# Patient Record
Sex: Female | Born: 1950 | Race: White | Hispanic: No | Smoking: Never smoker
Health system: Southern US, Community
[De-identification: ages and names within clinical notes are randomized; demographics above are authoritative.]

## PROBLEM LIST (undated history)

## (undated) DIAGNOSIS — F329 Major depressive disorder, single episode, unspecified: Secondary | ICD-10-CM

## (undated) DIAGNOSIS — F32A Depression, unspecified: Secondary | ICD-10-CM

## (undated) HISTORY — PX: BACK SURGERY: SHX140

## (undated) HISTORY — PX: KNEE ARTHROSCOPY: SUR90

## (undated) HISTORY — PX: COLON SURGERY: SHX602

---

## 1992-04-12 HISTORY — PX: ABDOMINAL HYSTERECTOMY: SHX81

## 1998-09-19 ENCOUNTER — Observation Stay (HOSPITAL_COMMUNITY): Admission: EM | Admit: 1998-09-19 | Discharge: 1998-09-20 | Payer: Self-pay | Admitting: Emergency Medicine

## 1998-09-19 ENCOUNTER — Encounter: Payer: Self-pay | Admitting: Emergency Medicine

## 1998-09-20 ENCOUNTER — Encounter: Payer: Self-pay | Admitting: General Surgery

## 1999-04-15 ENCOUNTER — Other Ambulatory Visit: Admission: RE | Admit: 1999-04-15 | Discharge: 1999-04-22 | Payer: Self-pay | Admitting: Psychiatry

## 1999-07-21 ENCOUNTER — Other Ambulatory Visit: Admission: RE | Admit: 1999-07-21 | Discharge: 1999-07-21 | Payer: Self-pay | Admitting: Obstetrics and Gynecology

## 2000-02-20 ENCOUNTER — Inpatient Hospital Stay (HOSPITAL_COMMUNITY): Admission: EM | Admit: 2000-02-20 | Discharge: 2000-02-22 | Payer: Self-pay | Admitting: Emergency Medicine

## 2000-02-20 ENCOUNTER — Encounter: Payer: Self-pay | Admitting: Surgery

## 2000-02-21 ENCOUNTER — Encounter: Payer: Self-pay | Admitting: Surgery

## 2002-02-12 ENCOUNTER — Other Ambulatory Visit: Admission: RE | Admit: 2002-02-12 | Discharge: 2002-02-12 | Payer: Self-pay | Admitting: Obstetrics and Gynecology

## 2002-06-20 ENCOUNTER — Other Ambulatory Visit: Admission: RE | Admit: 2002-06-20 | Discharge: 2002-06-20 | Payer: Self-pay | Admitting: Obstetrics & Gynecology

## 2002-08-10 ENCOUNTER — Inpatient Hospital Stay (HOSPITAL_COMMUNITY): Admission: RE | Admit: 2002-08-10 | Discharge: 2002-08-14 | Payer: Self-pay | Admitting: Neurosurgery

## 2002-08-10 ENCOUNTER — Encounter: Payer: Self-pay | Admitting: Neurosurgery

## 2003-04-23 ENCOUNTER — Other Ambulatory Visit: Admission: RE | Admit: 2003-04-23 | Discharge: 2003-04-23 | Payer: Self-pay | Admitting: Obstetrics and Gynecology

## 2003-10-17 ENCOUNTER — Inpatient Hospital Stay (HOSPITAL_COMMUNITY): Admission: AD | Admit: 2003-10-17 | Discharge: 2003-10-22 | Payer: Self-pay | Admitting: Psychiatry

## 2007-04-24 ENCOUNTER — Ambulatory Visit: Payer: Self-pay | Admitting: Family Medicine

## 2007-05-01 ENCOUNTER — Encounter: Admission: RE | Admit: 2007-05-01 | Discharge: 2007-06-15 | Payer: Self-pay | Admitting: Otolaryngology

## 2008-09-19 ENCOUNTER — Emergency Department (HOSPITAL_COMMUNITY): Admission: EM | Admit: 2008-09-19 | Discharge: 2008-09-19 | Payer: Self-pay | Admitting: Emergency Medicine

## 2009-02-22 ENCOUNTER — Emergency Department (HOSPITAL_COMMUNITY): Admission: EM | Admit: 2009-02-22 | Discharge: 2009-02-22 | Payer: Self-pay | Admitting: Emergency Medicine

## 2009-04-02 ENCOUNTER — Ambulatory Visit: Payer: Self-pay | Admitting: Family Medicine

## 2009-05-06 ENCOUNTER — Ambulatory Visit: Payer: Self-pay | Admitting: Family Medicine

## 2009-07-21 LAB — HM DEXA SCAN

## 2009-07-23 ENCOUNTER — Ambulatory Visit: Payer: Self-pay | Admitting: Family Medicine

## 2009-12-10 ENCOUNTER — Ambulatory Visit: Payer: Self-pay | Admitting: Family Medicine

## 2010-01-29 ENCOUNTER — Ambulatory Visit: Payer: Self-pay | Admitting: Family Medicine

## 2010-06-08 LAB — HM MAMMOGRAPHY: HM Mammogram: NEGATIVE

## 2010-06-10 ENCOUNTER — Other Ambulatory Visit: Payer: Self-pay | Admitting: Radiology

## 2010-07-15 LAB — CBC
HCT: 39.2 % (ref 36.0–46.0)
Hemoglobin: 13.6 g/dL (ref 12.0–15.0)
MCHC: 34.6 g/dL (ref 30.0–36.0)
MCV: 94.1 fL (ref 78.0–100.0)
Platelets: 195 10*3/uL (ref 150–400)
RBC: 4.17 MIL/uL (ref 3.87–5.11)
RDW: 12.6 % (ref 11.5–15.5)
WBC: 9.7 10*3/uL (ref 4.0–10.5)

## 2010-07-15 LAB — BASIC METABOLIC PANEL
BUN: 19 mg/dL (ref 6–23)
CO2: 28 mEq/L (ref 19–32)
Calcium: 8.9 mg/dL (ref 8.4–10.5)
Chloride: 100 mEq/L (ref 96–112)
Creatinine, Ser: 0.9 mg/dL (ref 0.4–1.2)
GFR calc Af Amer: >60 mL/min (ref >60)
GFR calc non Af Amer: >60 mL/min (ref >60)
Glucose, Bld: 118 mg/dL — ABNORMAL HIGH (ref 70–99)
Potassium: 4.2 mEq/L (ref 3.5–5.1)
Sodium: 136 mEq/L (ref 135–145)

## 2010-07-15 LAB — DIFFERENTIAL
Basophils Absolute: 0.2 10*3/uL — ABNORMAL HIGH (ref 0.0–0.1)
Basophils Relative: 2 % — ABNORMAL HIGH (ref 0–1)
Eosinophils Absolute: 0 10*3/uL (ref 0.0–0.7)
Eosinophils Relative: 0 % (ref 0–5)
Lymphocytes Relative: 7 % — ABNORMAL LOW (ref 12–46)
Lymphs Abs: 0.7 10*3/uL (ref 0.7–4.0)
Monocytes Absolute: 0.4 10*3/uL (ref 0.1–1.0)
Monocytes Relative: 4 % (ref 3–12)
Neutro Abs: 8.4 10*3/uL — ABNORMAL HIGH (ref 1.7–7.7)
Neutrophils Relative %: 86 % — ABNORMAL HIGH (ref 43–77)

## 2010-07-15 LAB — LITHIUM LEVEL: Lithium Lvl: 0.42 mEq/L — ABNORMAL LOW (ref 0.80–1.40)

## 2010-07-20 LAB — CBC
HCT: 38.3 % (ref 36.0–46.0)
Hemoglobin: 12.9 g/dL (ref 12.0–15.0)
MCHC: 33.7 g/dL (ref 30.0–36.0)
MCV: 92.2 fL (ref 78.0–100.0)
Platelets: 198 10*3/uL (ref 150–400)
RBC: 4.16 MIL/uL (ref 3.87–5.11)
RDW: 13.2 % (ref 11.5–15.5)
WBC: 5.9 10*3/uL (ref 4.0–10.5)

## 2010-07-20 LAB — POCT I-STAT, CHEM 8
BUN: 18 mg/dL (ref 6–23)
Calcium, Ion: 1.2 mmol/L (ref 1.12–1.32)
Chloride: 103 mEq/L (ref 96–112)
Creatinine, Ser: 0.9 mg/dL (ref 0.4–1.2)
Glucose, Bld: 166 mg/dL — ABNORMAL HIGH (ref 70–99)
HCT: 40 % (ref 36.0–46.0)
Hemoglobin: 13.6 g/dL (ref 12.0–15.0)
Potassium: 3.5 mEq/L (ref 3.5–5.1)
Sodium: 139 mEq/L (ref 135–145)
TCO2: 27 mmol/L (ref 0–100)

## 2010-07-20 LAB — DIFFERENTIAL
Basophils Absolute: 0 10*3/uL (ref 0.0–0.1)
Basophils Relative: 0 % (ref 0–1)
Eosinophils Absolute: 0 10*3/uL (ref 0.0–0.7)
Eosinophils Relative: 0 % (ref 0–5)
Lymphocytes Relative: 11 % — ABNORMAL LOW (ref 12–46)
Lymphs Abs: 0.7 10*3/uL (ref 0.7–4.0)
Monocytes Absolute: 0.2 10*3/uL (ref 0.1–1.0)
Monocytes Relative: 4 % (ref 3–12)
Neutro Abs: 4.9 10*3/uL (ref 1.7–7.7)
Neutrophils Relative %: 84 % — ABNORMAL HIGH (ref 43–77)

## 2010-07-20 LAB — URINALYSIS, ROUTINE W REFLEX MICROSCOPIC
Bilirubin Urine: NEGATIVE
Glucose, UA: NEGATIVE mg/dL
Hgb urine dipstick: NEGATIVE
Ketones, ur: NEGATIVE mg/dL
Nitrite: NEGATIVE
Protein, ur: NEGATIVE mg/dL
Specific Gravity, Urine: 1.019 (ref 1.005–1.030)
Urobilinogen, UA: 0.2 mg/dL (ref 0.0–1.0)
pH: 6.5 (ref 5.0–8.0)

## 2010-07-20 LAB — RAPID URINE DRUG SCREEN, HOSP PERFORMED
Amphetamines: NOT DETECTED
Barbiturates: NOT DETECTED
Benzodiazepines: POSITIVE — AB
Cocaine: NOT DETECTED
Opiates: NOT DETECTED
Tetrahydrocannabinol: NOT DETECTED

## 2010-08-28 NOTE — H&P (Signed)
[facility]  Patient:    Kaitlyn Lowe, Kaitlyn Lowe                         MRN: [id number] Adm. Date:  [id number] Attending:  Benny Lennert                         History and Physical  CHIEF COMPLAINT:  Abdominal pain.  CLINICAL HISTORY:  Kaitlyn Lowe is a 60 year old who developed after dinner last night some cramping and abdominal pain and distention.  At one point it was fairly sharp and penetrating and fairly severe.  She threw up about 5 a.m. and the pain has eased off, but she has noticed continued mild distention and some mild crampiness.  The patient relates that about six years ago she had a hysterectomy for endometriosis.  Somewhat after that she developed further symptoms and was found to have endometriosis of her colon.  Dr. Chelsea Aus apparently did a sigmoid resection and at the same time she had bilateral oophorectomies. Three weeks after that surgery she has developed what sounds like a small bowel obstruction and had to reoperated on.  Patient did well for approximately two years and then had to be readmitted with a partial small bowel obstruction which according to the patient resolved with IV fluids and n.p.o. for a couple of days.  Two years after that another episode of a partial small bowel obstruction again resolving with some IV fluids.  It is now two years after that episode that this episode has occurred.  The patient does note she had a small bowel movement this morning just prior to having nausea and vomiting and another small one about 7 a.m.  She did drink a little bit of milk after she threw up and that has stayed down.  She is not currently nauseated.  PAST MEDICAL HISTORY:  Operations:  In addition to the above mentioned surgery, she has had arthroscopic knee surgery.  ALLERGIES:  CODEINE causes nausea.  MEDICATIONS: 1. She is on clonazepam to help her sleep. 2. Antidepression medication.  HABITS:  Smoking:  None.   Alcohol occasional.  FAMILY HISTORY:  Unremarkable.  REVIEW OF SYSTEMS:  HEENT:  Negative.  CHEST:  No cough, shortness of breath. HEART:  No history of murmurs or hypertension.  ABDOMEN:  Negative except for HPI.  GASTROINTESTINAL:  Negative.  No problems since her last surgery, as she has no pelvic organs left.  EXTREMITIES:  Negative.  SOCIAL HISTORY:  The patient is a Associate Professor for elementary school.  PHYSICAL EXAMINATION:  GENERAL:  The patient is a healthy appearing 60 year old who is alert and oriented in no distress.  VITAL SIGNS:  Blood pressure 107/74, pulse 77, respirations 20, temperature 97.  HEENT:  Head is normocephalic.  Eyes:  Nonicteric.  Pupils are round and regular.  Pharynx:  There is no evidence of significant volume depletion. Mucous membranes are moist.  NECK:  Supple.  There is no mass or thyromegaly.  LUNGS:  Clear to auscultation.  HEART:  Regular.  No murmurs, rubs, or gallops.  ABDOMEN:  Slightly distended.  There is a well-healed midline scar from just above the umbilicus to the pubis and also a Pfannenstiel scar that is well healed.  There are no inguinal or incisional hernias noted.  The abdomen is soft but a little bit tender, but this is really a minimal finding.  There is no  guarding or rebound.  Bowel sounds are somewhat active.  RECTAL:  Not done.  EXTREMITIES:  Show no cyanosis or edema.  She has full adequate peripheral pulses, no varicosities.  NEUROLOGIC:  Grossly intact.  LABORATORY:  KUB and upright is reviewed and it looks like an early small bowel obstruction pattern with a little bit of air still in her colon, but some mildly dilated air fluid loops of small bowel.  There is no evidence of any free air.  Chest x-ray is unremarkable.  White count is normal.  Hemoglobin is 13 and electrolytes are normal.  IMPRESSION:  Recurrent partial small bowel obstruction.  PLAN:  Put her in on IV fluids and n.p.o.  and repeat x-rays in the morning.  I told her that at some point she may need to consider elective operative intervention, since this will be her third hospitalization for partial obstructions.  I also told her this time she may need more urgent surgery if this obstruction does not clear. DD:  02/20/00 TD:  02/20/00 Job: [id number]
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
## 2010-08-28 NOTE — H&P (Signed)
NAMEMARJANI, Kaitlyn Lowe NO.:  [id number]   MEDICAL RECORD NO.:  [id number]                   PATIENT TYPE:  IPS   LOCATION:  0506                                 [facility]ILITY[facility]   PHYSICIAN:  Jeanice Lim, M.D.              DATE OF BIRTH:  1950/04/24   DATE OF ADMISSION:  10/17/2003  DATE OF DISCHARGE:                         PSYCHIATRIC ADMISSION ASSESSMENT   IDENTIFYING INFORMATION:  The patient is a 60 year old single white female  voluntarily admitted on October 17, 2003.   HISTORY OF PRESENT ILLNESS:  The patient presents with a history of  depression, feeling very hopeless, feeling that she is dropping farther and  farther into a black tunnel.  Her stressors are that her parents are  elderly, they are selling their house, going to assisted living.  She is  unable to cope with that.  She feels very lonely.  Her 20-year relationship  with her same sex partner ended in 1999 and she has been unable to find  anyone since.  She is very overwhelmed with her occupation.  The patient  reports that she gets confused with her dates, unable to concentrate.  The  patient reports that she is unable to function as an adult any more.  The  patient reports a history of hypomania episodes when she was younger with  spending sprees, increased energy, and impulsivity.   PAST PSYCHIATRIC HISTORY:  This is the first admission to [facility].  She was an outpatient seeing Jasmine Pang, M.D.   SUBSTANCE ABUSE HISTORY:  She is a nonsmoker.  She denies any alcohol or  drug use.   PAST MEDICAL HISTORY:  Primary care [facility] Dattilio: Dr. Gordy Levan.  Medical  problems: None.   MEDICATIONS:  1. Restoril 30 mg at bedtime.  2. Adderall 20 mg daily.  3. Lexapro 20 mg daily.  4. Klonopin 0.5 mg t.i.d.  5. Estropip 0.75 mg/0.625 mg one half daily.   DRUG ALLERGIES:  CODEINE.   REVIEW OF SYSTEMS:  Review of systems is negative except for menopausal.   PHYSICAL EXAMINATION:  GENERAL:  Physical examination was performed.  This  is a middle-aged, well nourished, healthy appearing female in no acute  distress.  NEUROLOGIC:  Nonfocal neurological findings.  Easily able to perform normal  alternating movements, heel-to-shin.  VITAL SIGNS:  Temperature 97.4, heart rate 74, respirations 18, blood  pressure 119/81.  She weighs 128 pounds.   LABORATORY DATA:  CBC is within normal limits.  Chem 7 is within normal  limits.  TSH is 2.176.   SOCIAL HISTORY:  This is a 60 year old divorced white female with no  children.  She lives alone.  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  No legal problems.   FAMILY HISTORY:  Mother who has had ECT treatments in the past.   MENTAL STATUS EXAM:  Alert, middle-aged  female, cooperative, good eye  contact.  Speech is clear and articulate.  The patient feels depressed.  The  patient is somewhat irritable.  Thought processes are coherent; no evidence  of psychosis.  Cognitive functioning: Intact.  Memory is good.  Judgment is  fair.  Insight is fair.   ADMISSION DIAGNOSES:   AXIS I:  1. Major depressive disorder.  2. Rule out bipolar disorder.   AXIS II:  Deferred.   AXIS III:  None.   AXIS IV:  Problems with primary support group, occupation, other  psychosocial problems.   AXIS V:  Current is 30, this past year is 65-70.   INITIAL PLAN OF CARE:  Plan is a voluntary admission to [facility] for depression.  Contract for safety.  Stabilize mood and thinking.  We will decrease her Lexapro as it may be increasing the patient's  agitation.  Will add Depakote for mood stability and to decrease any  possibility of mood swings.  Will hold the patient's Adderall at this time.  The patient is to attend groups, increase coping skills.  The patient is to  follow up with Jasmine Pang, M.D.   ESTIMATED LENGTH OF STAY:  Three to five days.     Landry Corporal,  N.P.                       Jeanice Lim, M.D.    JO/MEDQ  D:  10/18/2003  T:  10/18/2003  Job:  [id number]

## 2010-08-28 NOTE — Discharge Summary (Signed)
[facility]  Patient:    Kaitlyn Lowe, Kaitlyn Lowe                         MRN: [id number] Adm. Date:  [id number] Disc. Date: 02/22/00 Attending:  Charlton Haws                           Discharge Summary  ACCOUNT:  [id number]  FINAL DIAGNOSIS:  Partial small-bowel obstruction, resolved.  CLINICAL HISTORY:  This patient is a 59 year old who is status post TAH as well as probable sigmoid colectomy for endometriosis who has had one small-bowel obstruction requiring operation and two others each two years apart that resolved spontaneously.  She comes back in with typical symptoms of a partial small-bowel obstruction similar to what she has had previously.  On exam on admission she was in no distress, but was distended with some hyperactive bowel sounds and no significant tenderness.  ADMISSION LABORATORY AND X-RAY DATA:  Lab studies were unremarkable.  Abdominal films were consistent with a small-bowel obstruction pattern.  HOSPITAL COURSE:  The patient was admitted and put on some IV fluids and left n.p.o.  The next morning she had passed gas, had a small bowel movement, and her abdomen was less distended.  X-rays showed a basically normal gas pattern with air in her colon and a little stool in her rectum.  She was given a Fleet enema and started on diet, and she tolerated that nicely and the next morning continued to feel fine, seemed to be passing gas.  Her abdomen was benign, soft, flat, nontender, and nondistended.  The patient was felt to have resolved her small-bowel obstruction and able to be discharged.  She felt ready to go.  CONDITION ON DISCHARGE:  She was discharged in satisfactory condition.  DISCHARGE MEDICATIONS:  No home medications.  DISCHARGE INSTRUCTIONS:  Usual activities.  We did ask her to follow a low-residue diet for a few days.  FOLLOWUP:  Follow up on a p.r.n. basis should she have any further symptoms. DD:  02/22/00 TD:   02/22/00 Job: [id number]
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
## 2010-08-28 NOTE — Discharge Summary (Signed)
Kaitlyn Lowe, Kaitlyn Lowe NO.:  [id number]   MEDICAL RECORD NO.:  [id number]                   PATIENT TYPE:  IPS   LOCATION:  0506                                 [facility]ACILITY[facility]  [facility]   PHYSICIAN:  Geoffery Lyons, M.D.                   DATE OF BIRTH:  Mar 02, 1951   DATE OF ADMISSION:  10/17/2003  DATE OF DISCHARGE:  10/22/2003                                 DISCHARGE SUMMARY   CHIEF COMPLAINT AND PRESENTING ILLNESS:  This was the first admission to  [facility]  for this 60 year old single white female,  voluntarily admitted.  History of depression, feeling very hopeless, feeling  that she was dropping further and further into a black tunnel.  Her  stressors are that her parents are elderly.  They are selling their house,  going to assisted living, unable to cope with that.  Felt very lonely.  Her  20 year relationship with her same-sex partner ended in 1999, unable to find  anyone since.  Feeling overwhelmed with her occupation.  She has confused  her dates, unable to concentrate, unable to function as an adult anymore.  History of hypomania episodes when she was younger, with increased energy  and impulsivity.   PAST PSYCHIATRIC HISTORY:  First time [facility], outpatient  seeing Milford Cage.   ALCOHOL AND DRUG HISTORY:  Denies the use or abuse of any substances.   PAST MEDICAL HISTORY:  Noncontributory.   MEDICATIONS:  1. Restoril 30 mg at night.  2. Adderall 20 mg daily.  3. Lexapro 20 mg daily.  4. Klonopin 0.5 3 times a day.  5. Estropipate .75 mg-.625 mg 1 tab daily.   PHYSICAL EXAMINATION:  Performed, failed to show any acute findings.   LABORATORY WORKUP:  CBC within normal limits.  Chem 7 within normal limits.  TSH 2.176.  blood chemistries were within normal limits.   MENTAL STATUS EXAM:  Reveals an alert female, cooperative, good eye contact.  Speech was clear and articulate, felt depressed, somewhat  irritable.  Thought processes were coherent, no evidence of psychosis.  Cognition well  preserved.   ADMISSION DIAGNOSES:   AXIS I:  Rule out bipolar disorder, depressed versus unipolar depression.   AXIS II:  No diagnosis.   AXIS III:  No diagnosis.   AXIS IV:  Moderate.   AXIS V:  Global assessment of function upon admission 30, highest global  assessment of function in past year 65-70.   COURSE IN HOSPITAL:  She was admitted and started on intensive individual  and group psychotherapy.  She was maintained on the Restoril 30 at night,  Klonopin 0.5 3 times a day, and Lexapro 20 mg per day, and the estropipate  .75/.625 once daily.  She was started on started on Xanax 2 in the morning.  Lexapro was changed to 10 mg twice a day and  she was started on Depakote ER  250 twice a day.  She was given some Seroquel 25 as needed..  She endorsed  depression, still racing thoughts, also stress in her life.  Sense of  hopelessness and helplessness, vague suicidal thoughts.  Depakote was  changed to 250 in the morning and 500 at night.  By July 10 she was  experiencing some improvement on the racing thoughts and mood swings.  She  was tolerating medication well.  Continued to taper the Lexapro down to 5 mg  per day.  On July 11, some episode of irritability, anger, loss of control,  impulsivity, already off the Lexapro.  We went ahead and increased the  Depakote further.  On July 12, she was in full contact with reality.  There  were no suicidal ideas, no homicidal ideas, no hallucinations, no delusions.  Endorsing that she was feeling much better, willing to pursue further  outpatient treatment and continue the medications as prescribed.  If in need  of an antidepressant, was going to be considered for Zoloft on an outpatient  basis.   DISCHARGE DIAGNOSES:   AXIS I:  Bipolar disorder, depressed.   AXIS II:  No diagnosis.   AXIS III:  No diagnosis.   AXIS IV:  Moderate.   AXIS V:   Global assessment of function upon discharge 55-60.   DISCHARGE MEDICATIONS:  1. Restoril 30 mg at night.  2. Klonopin 0.5 3 times a day as needed.  3. Senokot 2 tabs every day.  4. Ogen .0313 mg daily.  5. Depakote ER 250 3 times a day.   DISPOSITION:  Will follow up with Dr. Milford Cage and will call mental  health IOP if needed.                                               Geoffery Lyons, M.D.    IL/MEDQ  D:  11/12/2003  T:  11/13/2003  Job:  [id number]
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
## 2010-10-22 ENCOUNTER — Other Ambulatory Visit: Payer: Self-pay | Admitting: Neurosurgery

## 2010-10-22 DIAGNOSIS — M549 Dorsalgia, unspecified: Secondary | ICD-10-CM

## 2010-10-22 DIAGNOSIS — M5410 Radiculopathy, site unspecified: Secondary | ICD-10-CM

## 2010-11-02 MED ORDER — DIAZEPAM 2 MG PO TABS
10.0000 mg | ORAL_TABLET | Freq: Once | ORAL | Status: AC
  Administered 2010-11-03: 10 mg via ORAL

## 2010-11-03 ENCOUNTER — Ambulatory Visit
Admission: RE | Admit: 2010-11-03 | Discharge: 2010-11-03 | Disposition: A | Discharge status: Home / Self Care | Payer: BC Managed Care – PPO | Source: Ambulatory Visit | Attending: Neurosurgery | Admitting: Neurosurgery

## 2010-11-03 DIAGNOSIS — M549 Dorsalgia, unspecified: Secondary | ICD-10-CM

## 2010-11-03 DIAGNOSIS — M5410 Radiculopathy, site unspecified: Secondary | ICD-10-CM

## 2010-11-03 MED ORDER — IOHEXOL 180 MG/ML  SOLN
15.0000 mL | Freq: Once | INTRAMUSCULAR | Status: AC | PRN
  Administered 2010-11-03: 15 mL via INTRATHECAL

## 2010-11-03 MED ORDER — IOHEXOL 180 MG/ML  SOLN: 15.0000 mL | Freq: Once | INTRAMUSCULAR | Status: DC | PRN

## 2010-11-03 MED ORDER — DEXTROSE-NACL 5-0.45 % IV SOLN: INTRAVENOUS | Status: DC

## 2011-01-06 ENCOUNTER — Encounter: Payer: Self-pay | Admitting: Family Medicine

## 2011-01-07 ENCOUNTER — Encounter: Payer: Self-pay | Admitting: Family Medicine

## 2011-10-19 ENCOUNTER — Encounter: Payer: Self-pay | Admitting: Internal Medicine

## 2011-10-19 LAB — HM MAMMOGRAPHY

## 2012-10-10 LAB — HM MAMMOGRAPHY: HM Mammogram: NEGATIVE

## 2012-10-10 LAB — HM DEXA SCAN

## 2012-10-17 ENCOUNTER — Encounter (HOSPITAL_COMMUNITY): Payer: Self-pay

## 2012-10-17 ENCOUNTER — Inpatient Hospital Stay (HOSPITAL_COMMUNITY)
Admission: EM | Admit: 2012-10-17 | Discharge: 2012-10-19 | DRG: 181 | Disposition: A | Discharge status: Home / Self Care | Payer: BC Managed Care – PPO | Attending: General Surgery | Admitting: General Surgery

## 2012-10-17 ENCOUNTER — Encounter: Payer: Self-pay | Admitting: Internal Medicine

## 2012-10-17 ENCOUNTER — Emergency Department (HOSPITAL_COMMUNITY): Payer: BC Managed Care – PPO

## 2012-10-17 DIAGNOSIS — K56609 Unspecified intestinal obstruction, unspecified as to partial versus complete obstruction: Secondary | ICD-10-CM

## 2012-10-17 DIAGNOSIS — F3289 Other specified depressive episodes: Secondary | ICD-10-CM | POA: Diagnosis present

## 2012-10-17 DIAGNOSIS — Z90710 Acquired absence of both cervix and uterus: Secondary | ICD-10-CM

## 2012-10-17 DIAGNOSIS — F329 Major depressive disorder, single episode, unspecified: Secondary | ICD-10-CM | POA: Diagnosis present

## 2012-10-17 DIAGNOSIS — Z9049 Acquired absence of other specified parts of digestive tract: Secondary | ICD-10-CM

## 2012-10-17 HISTORY — DX: Major depressive disorder, single episode, unspecified: F32.9

## 2012-10-17 HISTORY — DX: Depression, unspecified: F32.A

## 2012-10-17 LAB — CBC WITH DIFFERENTIAL/PLATELET
Basophils Absolute: 0 10*3/uL (ref 0.0–0.1)
Basophils Relative: 0 % (ref 0–1)
Eosinophils Absolute: 0.1 10*3/uL (ref 0.0–0.7)
Eosinophils Relative: 1 % (ref 0–5)
HCT: 42.2 % (ref 36.0–46.0)
Hemoglobin: 14.2 g/dL (ref 12.0–15.0)
Lymphocytes Relative: 10 % — ABNORMAL LOW (ref 12–46)
Lymphs Abs: 1 10*3/uL (ref 0.7–4.0)
MCH: 30.7 pg (ref 26.0–34.0)
MCHC: 33.6 g/dL (ref 30.0–36.0)
MCV: 91.3 fL (ref 78.0–100.0)
Monocytes Absolute: 0.5 10*3/uL (ref 0.1–1.0)
Monocytes Relative: 5 % (ref 3–12)
Neutro Abs: 8.2 10*3/uL — ABNORMAL HIGH (ref 1.7–7.7)
Neutrophils Relative %: 84 % — ABNORMAL HIGH (ref 43–77)
Platelets: 283 10*3/uL (ref 150–400)
RBC: 4.62 MIL/uL (ref 3.87–5.11)
RDW: 13.1 % (ref 11.5–15.5)
WBC: 9.7 10*3/uL (ref 4.0–10.5)

## 2012-10-17 LAB — COMPREHENSIVE METABOLIC PANEL
ALT: 17 U/L (ref 0–35)
AST: 21 U/L (ref 0–37)
Albumin: 4.1 g/dL (ref 3.5–5.2)
Alkaline Phosphatase: 36 U/L — ABNORMAL LOW (ref 39–117)
BUN: 19 mg/dL (ref 6–23)
CO2: 31 mEq/L (ref 19–32)
Calcium: 11.2 mg/dL — ABNORMAL HIGH (ref 8.4–10.5)
Chloride: 100 mEq/L (ref 96–112)
Creatinine, Ser: 0.91 mg/dL (ref 0.50–1.10)
GFR calc Af Amer: 77 mL/min — ABNORMAL LOW (ref >90)
GFR calc non Af Amer: 66 mL/min — ABNORMAL LOW (ref >90)
Glucose, Bld: 108 mg/dL — ABNORMAL HIGH (ref 70–99)
Potassium: 4 mEq/L (ref 3.5–5.1)
Sodium: 139 mEq/L (ref 135–145)
Total Bilirubin: 0.7 mg/dL (ref 0.3–1.2)
Total Protein: 6.9 g/dL (ref 6.0–8.3)

## 2012-10-17 LAB — URINALYSIS, ROUTINE W REFLEX MICROSCOPIC
Bilirubin Urine: NEGATIVE
Glucose, UA: NEGATIVE mg/dL
Hgb urine dipstick: NEGATIVE
Ketones, ur: NEGATIVE mg/dL
Nitrite: NEGATIVE
Protein, ur: NEGATIVE mg/dL
Specific Gravity, Urine: 1.019 (ref 1.005–1.030)
Urobilinogen, UA: 1 mg/dL (ref 0.0–1.0)
pH: 8 (ref 5.0–8.0)

## 2012-10-17 LAB — LIPASE, BLOOD: Lipase: 27 U/L (ref 11–59)

## 2012-10-17 LAB — URINE MICROSCOPIC-ADD ON

## 2012-10-17 MED ORDER — POTASSIUM CHLORIDE IN NACL 20-0.45 MEQ/L-% IV SOLN
INTRAVENOUS | Status: DC
  Administered 2012-10-17 – 2012-10-19 (×5): via INTRAVENOUS
  Filled 2012-10-17 (×7): qty 1000

## 2012-10-17 MED ORDER — LITHIUM CARBONATE 300 MG PO CAPS
300.0000 mg | ORAL_CAPSULE | Freq: Two times a day (BID) | ORAL | Status: AC
  Administered 2012-10-18 (×2): 300 mg via ORAL
  Filled 2012-10-17 (×3): qty 1

## 2012-10-17 MED ORDER — HYDROMORPHONE HCL PF 1 MG/ML IJ SOLN
1.0000 mg | Freq: Once | INTRAMUSCULAR | Status: AC
  Administered 2012-10-17: 1 mg via INTRAVENOUS
  Filled 2012-10-17: qty 1

## 2012-10-17 MED ORDER — ONDANSETRON HCL 4 MG/2ML IJ SOLN
4.0000 mg | Freq: Once | INTRAMUSCULAR | Status: AC
  Administered 2012-10-17: 4 mg via INTRAVENOUS
  Filled 2012-10-17: qty 2

## 2012-10-17 MED ORDER — FLUOXETINE HCL 20 MG PO CAPS
20.0000 mg | ORAL_CAPSULE | Freq: Every day | ORAL | Status: DC
  Administered 2012-10-18 – 2012-10-19 (×2): 20 mg via ORAL
  Filled 2012-10-17 (×2): qty 1

## 2012-10-17 MED ORDER — IOHEXOL 300 MG/ML  SOLN
100.0000 mL | Freq: Once | INTRAMUSCULAR | Status: AC | PRN
  Administered 2012-10-17: 100 mL via INTRAVENOUS

## 2012-10-17 MED ORDER — MORPHINE SULFATE 2 MG/ML IJ SOLN
1.0000 mg | INTRAMUSCULAR | Status: DC | PRN
  Administered 2012-10-17: 2 mg via INTRAVENOUS
  Filled 2012-10-17: qty 1

## 2012-10-17 MED ORDER — ONDANSETRON HCL 4 MG/2ML IJ SOLN
4.0000 mg | Freq: Four times a day (QID) | INTRAMUSCULAR | Status: DC | PRN
  Administered 2012-10-17 – 2012-10-18 (×2): 4 mg via INTRAVENOUS
  Filled 2012-10-17 (×2): qty 2

## 2012-10-17 MED ORDER — IOHEXOL 300 MG/ML  SOLN
50.0000 mL | Freq: Once | INTRAMUSCULAR | Status: AC | PRN
  Administered 2012-10-17: 50 mL via INTRAVENOUS

## 2012-10-17 MED ORDER — TRAZODONE HCL 100 MG PO TABS
100.0000 mg | ORAL_TABLET | Freq: Every day | ORAL | Status: DC
Start: 2012-10-17 — End: 2012-10-19
  Administered 2012-10-17 – 2012-10-18 (×2): 100 mg via ORAL
  Filled 2012-10-17 (×3): qty 1

## 2012-10-17 MED ORDER — HEPARIN SODIUM (PORCINE) 5000 UNIT/ML IJ SOLN
5000.0000 [IU] | Freq: Three times a day (TID) | INTRAMUSCULAR | Status: DC
  Administered 2012-10-17 – 2012-10-19 (×5): 5000 [IU] via SUBCUTANEOUS
  Filled 2012-10-17 (×8): qty 1

## 2012-10-17 MED ORDER — SODIUM CHLORIDE 0.9 % IV SOLN
1000.0000 mL | Freq: Once | INTRAVENOUS | Status: AC
  Administered 2012-10-17: 1000 mL via INTRAVENOUS

## 2012-10-17 NOTE — ED Provider Notes (Signed)
History    CSN: [id number] Arrival date & time 10/17/12  1439  First MD Initiated Contact with Patient 10/17/12 1514     Chief Complaint  Patient presents with  . Abdominal Pain   HPI  Patient presents with abdominal pain, nausea and anorexia. Symptoms began yesterday, subtly, progressed over the course of the day today.  The pain is about the umbilicus, and upper abdomen.  The pain is nonradiating, sore, crampy with associated abdominal distention. Patient's last bowel movement was approximately 6 hours ago. She denies fever, chills, confusion constipation. No clear alleviating or exacerbating factors. Patient states that these symptoms are the same as those she experienced during prior small bowel obstructions.  Past Medical History  Diagnosis Date  . Depression    Past Surgical History  Procedure Laterality Date  . Abdominal hysterectomy  1994  . Back surgery    . Colon surgery    . Knee arthroscopy      bilateral knees.   Family History  Problem Relation Age of Onset  . Arthritis Mother   . Mental illness Mother   . Hypertension Mother    History  Substance Use Topics  . Smoking status: Never Smoker   . Smokeless tobacco: Never Used  . Alcohol Use: Yes     Comment: three times a week 2 drinks at a time.   OB History   Grav Para Term Preterm Abortions TAB SAB Ect Mult Living                 Review of Systems  Constitutional:       Per HPI, otherwise negative  HENT:       Per HPI, otherwise negative  Respiratory:       Per HPI, otherwise negative  Cardiovascular:       Per HPI, otherwise negative  Gastrointestinal: Positive for nausea and abdominal pain. Negative for vomiting.  Endocrine:       Negative aside from HPI  Genitourinary:       Neg aside from HPI   Musculoskeletal:       Per HPI, otherwise negative  Skin: Negative.   Neurological: Negative for syncope.    Allergies  Codeine  Home Medications   Current Outpatient Rx  Name  Route   Sig  Dispense  Refill  . estradiol (ESTRACE) 0.5 MG tablet   Oral   Take 0.5 mg by mouth daily.          Marland Kitchen FLUoxetine (PROZAC) 20 MG capsule   Oral   Take 20 mg by mouth daily.         Marland Kitchen lithium carbonate 300 MG capsule   Oral   Take 300 mg by mouth 2 (two) times daily with a meal.           . naproxen sodium (ANAPROX) 220 MG tablet   Oral   Take 220 mg by mouth 2 (two) times daily with a meal.         . traZODone (DESYREL) 100 MG tablet   Oral   Take 100 mg by mouth at bedtime.            BP 160/86  Pulse 69  Temp(Src) 98.1 F (36.7 C) (Oral)  Resp 14  SpO2 97% Physical Exam  Nursing note and vitals reviewed. Constitutional: She is oriented to person, place, and time. She appears well-developed and well-nourished. No distress.  HENT:  Head: Normocephalic and atraumatic.  Eyes: Conjunctivae and EOM are  normal.  Cardiovascular: Normal rate and regular rhythm.   Pulmonary/Chest: Effort normal and breath sounds normal. No stridor. No respiratory distress.  Abdominal: Soft. She exhibits distension. There is tenderness in the epigastric area and periumbilical area. There is guarding. There is no rigidity and no rebound.  Musculoskeletal: She exhibits no edema.  Neurological: She is alert and oriented to person, place, and time. No cranial nerve deficit.  Skin: Skin is warm and dry.  Psychiatric: She has a normal mood and affect.    ED Course  Procedures (including critical care time) Labs Reviewed  CBC WITH DIFFERENTIAL - Abnormal; Notable for the following:    Neutrophils Relative % 84 (*)    Neutro Abs 8.2 (*)    Lymphocytes Relative 10 (*)    All other components within normal limits  COMPREHENSIVE METABOLIC PANEL - Abnormal; Notable for the following:    Glucose, Bld 108 (*)    Calcium 11.2 (*)    Alkaline Phosphatase 36 (*)    GFR calc non Af Amer 66 (*)    GFR calc Af Amer 77 (*)    All other components within normal limits  URINALYSIS, ROUTINE W  REFLEX MICROSCOPIC - Abnormal; Notable for the following:    APPearance TURBID (*)    Leukocytes, UA SMALL (*)    All other components within normal limits  LIPASE, BLOOD  URINE MICROSCOPIC-ADD ON   No results found. No diagnosis found. Pulse ox 99% room air normal   Update: I discussed CT results with the patient.  She states that her pain is significantly better dilaudid.  MDM  Patient presents abdominal pain.  She has a tender abdomen on exam, but is not peritoneal.  With her description of pain similar to SBO in the past, this was an initial consideration.  This was indeed a demonstrated on CT scan.  She was admitted for further evaluation and management. Advocated for the placement of an NG tube.  The patient deferred this suggestion.  Gerhard Munch, MD 10/17/12 2150

## 2012-10-17 NOTE — Progress Notes (Signed)
Pt confirms pcp is [facility] EP[facility] updated

## 2012-10-17 NOTE — Progress Notes (Signed)
Utilization Review completed.  Levia Waltermire RN CM  

## 2012-10-17 NOTE — ED Notes (Signed)
Patient reports that she has had mid epigastric pain since last pm. Abdomen is distended. Patient denies N/V/D. Patient states her pain has gotten progressively worse. Patient's last BM was this AM and was normal.

## 2012-10-17 NOTE — Progress Notes (Signed)
Pt successfully signed up on My Chart. Briscoe Burns BSN, RN-BC Admissions RN 10/17/2012 6:49 PM

## 2012-10-17 NOTE — H&P (Signed)
Reason for Consult:bowel obstruction Referring Physician: Stefhanie Lowe is an 62 y.o. female.  HPI: I was asked to evaluate this patient for a small bowel obstruction. She has a history of several abdominal surgeries for endometriosis as well as a right colectomy in the late 1990s. She says that since then she has had about 3 or 4 episodes of bowel obstructions which occurred about every 5 years and each time have been treated nonoperatively.  She says that her current symptoms began this morning when she awoke with some abdominal pain mainly located in her epigastric region but she has this diffusely as well where she says it feels like someone is punching her.  She says that she has moved her bowels this morning which was normal and has been passing gas she has not been vomiting except for in the emergency room. She denies any fevers or chills. She denies any blood in the stools or melena and she says that she is current on her colonoscopy  Past Medical History  Diagnosis Date  . Depression     Past Surgical History  Procedure Laterality Date  . Abdominal hysterectomy  1994  . Back surgery    . Colon surgery    . Knee arthroscopy      bilateral knees.    Family History  Problem Relation Age of Onset  . Arthritis Mother   . Mental illness Mother   . Hypertension Mother     Social History:  reports that she has never smoked. She has never used smokeless tobacco. She reports that  drinks alcohol. She reports that she does not use illicit drugs.  Allergies:  Allergies  Allergen Reactions  . Codeine Nausea Only    Medications: I have reviewed the patient's current medications.  Results for orders placed during the hospital encounter of 10/17/12 (from the past 48 hour(s))  URINALYSIS, ROUTINE W REFLEX MICROSCOPIC     Status: Abnormal   Collection Time    10/17/12  3:27 PM      Result Value Range   Color, Urine YELLOW  YELLOW   APPearance TURBID (*) CLEAR   Specific  Gravity, Urine 1.019  1.005 - 1.030   pH 8.0  5.0 - 8.0   Glucose, UA NEGATIVE  NEGATIVE mg/dL   Hgb urine dipstick NEGATIVE  NEGATIVE   Bilirubin Urine NEGATIVE  NEGATIVE   Ketones, ur NEGATIVE  NEGATIVE mg/dL   Protein, ur NEGATIVE  NEGATIVE mg/dL   Urobilinogen, UA 1.0  0.0 - 1.0 mg/dL   Nitrite NEGATIVE  NEGATIVE   Leukocytes, UA SMALL (*) NEGATIVE  URINE MICROSCOPIC-ADD ON     Status: None   Collection Time    10/17/12  3:27 PM      Result Value Range   WBC, UA 0-2  <3 WBC/hpf   Comment: 0-2   Urine-Other AMORPHOUS URATES/PHOSPHATES     Comment: AMORPHOUS URATES/PHOSPHATES  CBC WITH DIFFERENTIAL     Status: Abnormal   Collection Time    10/17/12  3:37 PM      Result Value Range   WBC 9.7  4.0 - 10.5 K/uL   RBC 4.62  3.87 - 5.11 MIL/uL   Hemoglobin 14.2  12.0 - 15.0 g/dL   HCT 16.1  09.6 - 04.5 %   MCV 91.3  78.0 - 100.0 fL   MCH 30.7  26.0 - 34.0 pg   MCHC 33.6  30.0 - 36.0 g/dL   RDW 40.9  81.1 - 91.4 %  Platelets 283  150 - 400 K/uL   Neutrophils Relative % 84 (*) 43 - 77 %   Neutro Abs 8.2 (*) 1.7 - 7.7 K/uL   Lymphocytes Relative 10 (*) 12 - 46 %   Lymphs Abs 1.0  0.7 - 4.0 K/uL   Monocytes Relative 5  3 - 12 %   Monocytes Absolute 0.5  0.1 - 1.0 K/uL   Eosinophils Relative 1  0 - 5 %   Eosinophils Absolute 0.1  0.0 - 0.7 K/uL   Basophils Relative 0  0 - 1 %   Basophils Absolute 0.0  0.0 - 0.1 K/uL  COMPREHENSIVE METABOLIC PANEL     Status: Abnormal   Collection Time    10/17/12  3:37 PM      Result Value Range   Sodium 139  135 - 145 mEq/L   Potassium 4.0  3.5 - 5.1 mEq/L   Chloride 100  96 - 112 mEq/L   CO2 31  19 - 32 mEq/L   Glucose, Bld 108 (*) 70 - 99 mg/dL   BUN 19  6 - 23 mg/dL   Creatinine, Ser 1.61  0.50 - 1.10 mg/dL   Calcium 09.6 (*) 8.4 - 10.5 mg/dL   Total Protein 6.9  6.0 - 8.3 g/dL   Albumin 4.1  3.5 - 5.2 g/dL   AST 21  0 - 37 U/L   ALT 17  0 - 35 U/L   Alkaline Phosphatase 36 (*) 39 - 117 U/L   Total Bilirubin 0.7  0.3 - 1.2 mg/dL    GFR calc non Af Amer 66 (*) >90 mL/min   GFR calc Af Amer 77 (*) >90 mL/min   Comment:            The eGFR has been calculated     using the CKD EPI equation.     This calculation has not been     validated in all clinical     situations.     eGFR's persistently     <90 mL/min signify     possible Chronic Kidney Disease.  LIPASE, BLOOD     Status: None   Collection Time    10/17/12  3:37 PM      Result Value Range   Lipase 27  11 - 59 U/L    Ct Abdomen Pelvis W Contrast  10/17/2012   *RADIOLOGY REPORT*  Clinical Data: Abdominal pain.  History small bowel obstruction. Mid epigastric pain.  CT ABDOMEN AND PELVIS WITH CONTRAST  Technique:  Multidetector CT imaging of the abdomen and pelvis was performed following the standard protocol during bolus administration of intravenous contrast.  Contrast: 50mL OMNIPAQUE IOHEXOL 300 MG/ML  SOLN, OMNIPAQUE IOHEXOL 300 MG/ML  SOLN  Comparison: None.  Findings: Lung bases are clear.  No effusions.  Heart is normal size.  Small hypodensities scattered throughout the liver compatible with cysts.  The gallbladder, spleen, pancreas, adrenals and kidneys are normal.  There are dilated proximal and mid small bowel loops.  Mid to distal small bowel is decompressed.  Findings compatible with small bowel obstruction.  Transition point appears to be in the mid pelvis without identifiable cause, presumably adhesions. Postsurgical changes in the sigmoid colon.  There is trace free fluid around the liver and in the pelvis.  Mild edema within small bowel mesentery.  Aorta is normal caliber.  Mesenteric vessels are patent.  No free air or adenopathy.  No acute bony abnormality.  Postoperative changes from posterior  fusion at L5 S1.  IMPRESSION: Mid small bowel obstruction.  Transition point is in the region of the mid pelvis without identifiable cause, presumably adhesions.  Small amount of free fluid in the abdomen and pelvis.  Edema within small bowel mesentery.    Original Report Authenticated By: Charlett Nose, M.D.    All other review of systems negative or noncontributory except as stated in the HPI   Blood pressure 118/56, pulse 61, temperature 98.1 F (36.7 C), temperature source Oral, resp. rate 14, SpO2 96.00%. General appearance: alert, cooperative and no distress Head: Normocephalic, without obvious abnormality, atraumatic Eyes: negative Neck: no JVD and supple, symmetrical, trachea midline Resp: clear to auscultation bilaterally Cardio: normal rate, regular GI: soft, mild to moderate tenderness across her upper abdomen, mild distension, no peritoneal signs, and no hernias Extremities: extremities normal, atraumatic, no cyanosis or edema Skin: Skin color, texture, turgor normal. No rashes or lesions Neurologic: Grossly normal  Assessment/Plan: Small bowel obstruction From her history and physical exam I think that this is most likely a small bowel obstruction or partial small bowel obstruction, especially since she has been moving her bowels and flatus.  She did not have any fevers and her white blood cell count is normal and she did not have any peritonitis. She has had a few episodes previously which have all been treated nonoperatively and I discussed with her the options of nonoperative management versus surgery and the pros and cons of each option.  She will let to attempt another episode of nonoperative management. I have recommended admission with IV hydration, NG tube and bowel rest as well as serial abdominal exams. If she does not improving each day, and we will recommend surgical management.   Lodema Pilot DAVID 10/17/2012, 9:15 PM

## 2012-10-17 NOTE — ED Notes (Signed)
NGT clamped for transport.

## 2012-10-18 ENCOUNTER — Inpatient Hospital Stay (HOSPITAL_COMMUNITY): Payer: BC Managed Care – PPO

## 2012-10-18 DIAGNOSIS — K56609 Unspecified intestinal obstruction, unspecified as to partial versus complete obstruction: Secondary | ICD-10-CM | POA: Diagnosis present

## 2012-10-18 LAB — BASIC METABOLIC PANEL
BUN: 17 mg/dL (ref 6–23)
CO2: 30 mEq/L (ref 19–32)
Calcium: 10.4 mg/dL (ref 8.4–10.5)
Chloride: 102 mEq/L (ref 96–112)
Creatinine, Ser: 0.84 mg/dL (ref 0.50–1.10)
GFR calc Af Amer: 85 mL/min — ABNORMAL LOW (ref >90)
GFR calc non Af Amer: 73 mL/min — ABNORMAL LOW (ref >90)
Glucose, Bld: 119 mg/dL — ABNORMAL HIGH (ref 70–99)
Potassium: 4.3 mEq/L (ref 3.5–5.1)
Sodium: 140 mEq/L (ref 135–145)

## 2012-10-18 LAB — CBC
HCT: 41.2 % (ref 36.0–46.0)
Hemoglobin: 13.4 g/dL (ref 12.0–15.0)
MCH: 29.8 pg (ref 26.0–34.0)
MCHC: 32.5 g/dL (ref 30.0–36.0)
MCV: 91.8 fL (ref 78.0–100.0)
Platelets: 249 10*3/uL (ref 150–400)
RBC: 4.49 MIL/uL (ref 3.87–5.11)
RDW: 13.2 % (ref 11.5–15.5)
WBC: 9.6 10*3/uL (ref 4.0–10.5)

## 2012-10-18 MED ORDER — VITAMINS A & D EX OINT
TOPICAL_OINTMENT | CUTANEOUS | Status: AC
  Filled 2012-10-18: qty 5

## 2012-10-18 MED ORDER — LITHIUM CARBONATE 300 MG PO CAPS
600.0000 mg | ORAL_CAPSULE | Freq: Every day | ORAL | Status: DC
  Filled 2012-10-18: qty 2

## 2012-10-18 NOTE — Progress Notes (Signed)
Patient ID: Kaitlyn Lowe, female   DOB: [birth year], 62 y.o.   MRN: [id number]    Subjective: Feels much better today except doesn't like NG tube.  States abdominal pain is resolved, no flatus or BM  Objective: Vital signs in last 24 hours: Temp:  [97.8 F (36.6 C)-98.5 F (36.9 C)] 98.3 F (36.8 C) (07/09 0551) Pulse Rate:  [61-76] 66 (07/09 0551) Resp:  [14-18] 16 (07/09 0551) BP: (115-160)/(56-86) 123/70 mmHg (07/09 0551) SpO2:  [95 %-97 %] 97 % (07/09 0551) Last BM Date: 10/17/12  Intake/Output from previous day: 07/08 0701 - 07/09 0700 In: -  Out: 1305 [Urine:855; Emesis/NG output:450] Intake/Output this shift:    General appearance: alert, cooperative and no distress GI: normal findings: soft, non-tender and non distended  Lab Results:   Recent Labs  10/17/12 1537 10/18/12 0407  WBC 9.7 9.6  HGB 14.2 13.4  HCT 42.2 41.2  PLT 283 249   BMET  Recent Labs  10/17/12 1537 10/18/12 0407  NA 139 140  K 4.0 4.3  CL 100 102  CO2 31 30  GLUCOSE 108* 119*  BUN 19 17  CREATININE 0.91 0.84  CALCIUM 11.2* 10.4     Studies/Results: Ct Abdomen Pelvis W Contrast  10/17/2012   *RADIOLOGY REPORT*  Clinical Data: Abdominal pain.  History small bowel obstruction. Mid epigastric pain.  CT ABDOMEN AND PELVIS WITH CONTRAST  Technique:  Multidetector CT imaging of the abdomen and pelvis was performed following the standard protocol during bolus administration of intravenous contrast.  Contrast: 50mL OMNIPAQUE IOHEXOL 300 MG/ML  SOLN, OMNIPAQUE IOHEXOL 300 MG/ML  SOLN  Comparison: None.  Findings: Lung bases are clear.  No effusions.  Heart is normal size.  Small hypodensities scattered throughout the liver compatible with cysts.  The gallbladder, spleen, pancreas, adrenals and kidneys are normal.  There are dilated proximal and mid small bowel loops.  Mid to distal small bowel is decompressed.  Findings compatible with small bowel obstruction.  Transition point appears to be in  the mid pelvis without identifiable cause, presumably adhesions. Postsurgical changes in the sigmoid colon.  There is trace free fluid around the liver and in the pelvis.  Mild edema within small bowel mesentery.  Aorta is normal caliber.  Mesenteric vessels are patent.  No free air or adenopathy.  No acute bony abnormality.  Postoperative changes from posterior fusion at L5 S1.  IMPRESSION: Mid small bowel obstruction.  Transition point is in the region of the mid pelvis without identifiable cause, presumably adhesions.  Small amount of free fluid in the abdomen and pelvis.  Edema within small bowel mesentery.   Original Report Authenticated By: Charlett Nose, M.D.    Anti-infectives: Anti-infectives   None      Assessment/Plan: SBO, clinically improved today though no bowel function yet. Repeat X rays pending.  Continue NG and bowel rest for now    LOS: 1 day    Eladia Frame T 10/18/2012

## 2012-10-18 NOTE — Progress Notes (Signed)
Pt tolerated NG being clamped for about 1.5 hours for medication admin; denied nausea/pain; hooked by to suction per MD orders

## 2012-10-19 NOTE — Progress Notes (Signed)
Subjective: Wants to go home, feels fine.   Objective: Vital signs in last 24 hours: Temp:  [97.5 F (36.4 C)-98.6 F (37 C)] 97.5 F (36.4 C) (07/10 0524) Pulse Rate:  [60-63] 60 (07/10 0524) Resp:  [16-18] 16 (07/10 0524) BP: (120-135)/(59-75) 135/60 mmHg (07/10 0524) SpO2:  [98 %-100 %] 98 % (07/10 0524) Last BM Date: 10/17/12 Diet Ice chips/sips Afebrile VSS No labs Intake/Output from previous day: 07/09 0701 - 07/10 0700 In: 4171.3 [P.O.:240; I.V.:3931.3] Out: 3000 [Urine:3000] Intake/Output this shift: Total I/O In: 60 [P.O.:60] Out: 450 [Urine:450]  General appearance: alert, cooperative and no distress GI: soft, non-tender; bowel sounds normal; no masses,  no organomegaly  Lab Results:   Recent Labs  10/17/12 1537 10/18/12 0407  WBC 9.7 9.6  HGB 14.2 13.4  HCT 42.2 41.2  PLT 283 249    BMET  Recent Labs  10/17/12 1537 10/18/12 0407  NA 139 140  K 4.0 4.3  CL 100 102  CO2 31 30  GLUCOSE 108* 119*  BUN 19 17  CREATININE 0.91 0.84  CALCIUM 11.2* 10.4   PT/INR No results found for this basename: LABPROT, INR,  in the last 72 hours   Recent Labs Lab 10/17/12 1537  AST 21  ALT 17  ALKPHOS 36*  BILITOT 0.7  PROT 6.9  ALBUMIN 4.1     Lipase     Component Value Date/Time   LIPASE 27 10/17/2012 1537     Studies/Results: Ct Abdomen Pelvis W Contrast  10/17/2012   *RADIOLOGY REPORT*  Clinical Data: Abdominal pain.  History small bowel obstruction. Mid epigastric pain.  CT ABDOMEN AND PELVIS WITH CONTRAST  Technique:  Multidetector CT imaging of the abdomen and pelvis was performed following the standard protocol during bolus administration of intravenous contrast.  Contrast: 50mL OMNIPAQUE IOHEXOL 300 MG/ML  SOLN, OMNIPAQUE IOHEXOL 300 MG/ML  SOLN  Comparison: None.  Findings: Lung bases are clear.  No effusions.  Heart is normal size.  Small hypodensities scattered throughout the liver compatible with cysts.  The gallbladder,  spleen, pancreas, adrenals and kidneys are normal.  There are dilated proximal and mid small bowel loops.  Mid to distal small bowel is decompressed.  Findings compatible with small bowel obstruction.  Transition point appears to be in the mid pelvis without identifiable cause, presumably adhesions. Postsurgical changes in the sigmoid colon.  There is trace free fluid around the liver and in the pelvis.  Mild edema within small bowel mesentery.  Aorta is normal caliber.  Mesenteric vessels are patent.  No free air or adenopathy.  No acute bony abnormality.  Postoperative changes from posterior fusion at L5 S1.  IMPRESSION: Mid small bowel obstruction.  Transition point is in the region of the mid pelvis without identifiable cause, presumably adhesions.  Small amount of free fluid in the abdomen and pelvis.  Edema within small bowel mesentery.   Original Report Authenticated By: Charlett Nose, M.D.   Dg Abd 2 Views  10/18/2012   *RADIOLOGY REPORT*  Clinical Data: Abdominal distension.  ABDOMEN - 2 VIEW  Comparison: 10/17/2012.  Findings: Nasogastric tube has been placed with the tip at the level of the gastric body.  Interval decrease in gas distended small bowel loops.  Gas and stool throughout the colon.  No free intraperitoneal air.  Mild dextroscoliosis of the lumbar spine.  Prior fusion lower lumbar region.  IMPRESSION: Nasogastric tube placement with clearing of small bowel obstructive pattern.   Original Report Authenticated By: Viviann Spare  Constance Goltz, M.D.    Medications: . FLUoxetine  20 mg Oral Daily  . heparin  5,000 Units Subcutaneous Q8H  . lithium carbonate  600 mg Oral QHS  . traZODone  100 mg Oral QHS    Assessment/Plan SBO,  Prior abdominal hysterectomy and colon surgery Depression    Plan:  Advance diet, maybe home later today.   LOS: 2 days    JENNINGS,WILLARD 10/19/2012

## 2012-10-19 NOTE — Discharge Summary (Signed)
Physician Discharge Summary  Patient ID: Kaitlyn Lowe MRN: [id number] DOB/AGE: [birth year] 61 y.o.  Admit date: 10/17/2012 Discharge date: 10/19/2012  Admission Diagnoses:  SBO,  Prior abdominal hysterectomy and colon surgery  Hx of Depression    Discharge Diagnoses: Same Principal Problem:   SBO (small bowel obstruction)   PROCEDURES: none    Hospital Course: I was asked to evaluate this patient for a small bowel obstruction. She has a history of several abdominal surgeries for endometriosis as well as a right colectomy in the late 1990s. She says that since then she has had about 3 or 4 episodes of bowel obstructions which occurred about every 5 years and each time have been treated nonoperatively. She says that her current symptoms began this morning when she awoke with some abdominal pain mainly located in her epigastric region but she has this diffusely as well where she says it feels like someone is punching her. She says that she has moved her bowels this morning which was normal and has been passing gas she has not been vomiting except for in the emergency room. She denies any fevers or chills. She denies any blood in the stools or melena and she says that she is current on her colonoscopy. She was admitted and placed on bowel rest. NG decompression, and IV hydration.  She made good improvement over the next 48 hours, she was advanced to a full liquid diet and ask to go home.  She will continue full liquids for the next 2-3 days.   Follow up with Dr. Susann Givens and our office as needed.  Condition on d/c:  Improved  Disposition:      Medication List         estradiol 0.5 MG tablet  Commonly known as:  ESTRACE  Take 0.5 mg by mouth daily.     FLUoxetine 20 MG capsule  Commonly known as:  PROZAC  Take 20 mg by mouth daily.     lithium carbonate 300 MG capsule  Take 300 mg by mouth 2 (two) times daily with a meal.     naproxen sodium 220 MG tablet  Commonly known as:   ANAPROX  Take 220 mg by mouth 2 (two) times daily with a meal.     traZODone 100 MG tablet  Commonly known as:  DESYREL  Take 100 mg by mouth at bedtime.           Follow-up Information   Schedule an appointment as soon as possible for a visit with Carollee Herter, MD. (Let him know you were in the hospital and follow up as needed.Marland Kitchen)    Contact information:   [address] [place] [address] [phone]       Follow up with Mariella Saa, MD. (You can make a follow appointment if you need to see [place].)    Contact information:   [address] [place] [address] [phone]       Signed: Sherrie George 10/19/2012, 4:38 PM

## 2013-02-15 ENCOUNTER — Other Ambulatory Visit: Payer: Self-pay

## 2016-10-25 DIAGNOSIS — M79645 Pain in left finger(s): Secondary | ICD-10-CM | POA: Diagnosis not present

## 2016-10-28 DIAGNOSIS — M65312 Trigger thumb, left thumb: Secondary | ICD-10-CM | POA: Diagnosis not present

## 2016-10-28 DIAGNOSIS — M1812 Unilateral primary osteoarthritis of first carpometacarpal joint, left hand: Secondary | ICD-10-CM | POA: Diagnosis not present

## 2016-11-07 DIAGNOSIS — L237 Allergic contact dermatitis due to plants, except food: Secondary | ICD-10-CM | POA: Diagnosis not present

## 2016-11-23 DIAGNOSIS — Z1231 Encounter for screening mammogram for malignant neoplasm of breast: Secondary | ICD-10-CM | POA: Diagnosis not present

## 2017-01-07 DIAGNOSIS — Z23 Encounter for immunization: Secondary | ICD-10-CM | POA: Diagnosis not present

## 2017-01-13 DIAGNOSIS — H25013 Cortical age-related cataract, bilateral: Secondary | ICD-10-CM | POA: Diagnosis not present

## 2017-01-27 DIAGNOSIS — F3181 Bipolar II disorder: Secondary | ICD-10-CM | POA: Diagnosis not present

## 2017-02-01 DIAGNOSIS — Z1211 Encounter for screening for malignant neoplasm of colon: Secondary | ICD-10-CM | POA: Diagnosis not present

## 2017-02-01 DIAGNOSIS — K5904 Chronic idiopathic constipation: Secondary | ICD-10-CM | POA: Diagnosis not present

## 2017-02-01 DIAGNOSIS — R635 Abnormal weight gain: Secondary | ICD-10-CM | POA: Diagnosis not present

## 2017-02-09 DIAGNOSIS — Z1211 Encounter for screening for malignant neoplasm of colon: Secondary | ICD-10-CM | POA: Diagnosis not present

## 2017-02-09 DIAGNOSIS — K5904 Chronic idiopathic constipation: Secondary | ICD-10-CM | POA: Diagnosis not present

## 2017-02-28 DIAGNOSIS — M1812 Unilateral primary osteoarthritis of first carpometacarpal joint, left hand: Secondary | ICD-10-CM | POA: Diagnosis not present

## 2017-02-28 DIAGNOSIS — M654 Radial styloid tenosynovitis [de Quervain]: Secondary | ICD-10-CM | POA: Diagnosis not present

## 2017-02-28 DIAGNOSIS — M19042 Primary osteoarthritis, left hand: Secondary | ICD-10-CM | POA: Diagnosis not present

## 2017-02-28 DIAGNOSIS — M65312 Trigger thumb, left thumb: Secondary | ICD-10-CM | POA: Diagnosis not present

## 2017-02-28 DIAGNOSIS — M180 Bilateral primary osteoarthritis of first carpometacarpal joints: Secondary | ICD-10-CM | POA: Diagnosis not present

## 2017-02-28 DIAGNOSIS — G8918 Other acute postprocedural pain: Secondary | ICD-10-CM | POA: Diagnosis not present

## 2017-03-07 DIAGNOSIS — M79632 Pain in left forearm: Secondary | ICD-10-CM | POA: Diagnosis not present

## 2017-03-07 DIAGNOSIS — M1812 Unilateral primary osteoarthritis of first carpometacarpal joint, left hand: Secondary | ICD-10-CM | POA: Diagnosis not present

## 2017-03-07 DIAGNOSIS — M65312 Trigger thumb, left thumb: Secondary | ICD-10-CM | POA: Diagnosis not present

## 2017-03-07 DIAGNOSIS — M79645 Pain in left finger(s): Secondary | ICD-10-CM | POA: Diagnosis not present

## 2017-03-28 DIAGNOSIS — M65312 Trigger thumb, left thumb: Secondary | ICD-10-CM | POA: Diagnosis not present

## 2017-03-28 DIAGNOSIS — M1812 Unilateral primary osteoarthritis of first carpometacarpal joint, left hand: Secondary | ICD-10-CM | POA: Diagnosis not present

## 2017-04-14 DIAGNOSIS — M8580 Other specified disorders of bone density and structure, unspecified site: Secondary | ICD-10-CM | POA: Diagnosis not present

## 2017-04-14 DIAGNOSIS — Z01419 Encounter for gynecological examination (general) (routine) without abnormal findings: Secondary | ICD-10-CM | POA: Diagnosis not present

## 2017-04-14 DIAGNOSIS — Z124 Encounter for screening for malignant neoplasm of cervix: Secondary | ICD-10-CM | POA: Diagnosis not present

## 2017-04-14 DIAGNOSIS — N3941 Urge incontinence: Secondary | ICD-10-CM | POA: Diagnosis not present

## 2017-04-25 DIAGNOSIS — R531 Weakness: Secondary | ICD-10-CM | POA: Diagnosis not present

## 2017-04-25 DIAGNOSIS — M25632 Stiffness of left wrist, not elsewhere classified: Secondary | ICD-10-CM | POA: Diagnosis not present

## 2017-04-25 DIAGNOSIS — M1812 Unilateral primary osteoarthritis of first carpometacarpal joint, left hand: Secondary | ICD-10-CM | POA: Diagnosis not present

## 2017-04-25 DIAGNOSIS — M65312 Trigger thumb, left thumb: Secondary | ICD-10-CM | POA: Diagnosis not present

## 2017-05-24 DIAGNOSIS — M1812 Unilateral primary osteoarthritis of first carpometacarpal joint, left hand: Secondary | ICD-10-CM | POA: Diagnosis not present

## 2017-05-24 DIAGNOSIS — M65312 Trigger thumb, left thumb: Secondary | ICD-10-CM | POA: Diagnosis not present

## 2017-07-11 ENCOUNTER — Other Ambulatory Visit: Payer: Self-pay | Admitting: Family Medicine

## 2017-07-11 DIAGNOSIS — R1084 Generalized abdominal pain: Secondary | ICD-10-CM | POA: Diagnosis not present

## 2017-07-11 DIAGNOSIS — K469 Unspecified abdominal hernia without obstruction or gangrene: Secondary | ICD-10-CM | POA: Diagnosis not present

## 2017-07-19 ENCOUNTER — Other Ambulatory Visit: Payer: Self-pay | Admitting: Family Medicine

## 2017-07-20 ENCOUNTER — Ambulatory Visit
Admission: RE | Admit: 2017-07-20 | Discharge: 2017-07-20 | Disposition: A | Discharge status: Home / Self Care | Payer: Medicare Other | Source: Ambulatory Visit | Attending: Family Medicine | Admitting: Family Medicine

## 2017-07-20 DIAGNOSIS — R1084 Generalized abdominal pain: Secondary | ICD-10-CM

## 2017-11-24 DIAGNOSIS — Z1231 Encounter for screening mammogram for malignant neoplasm of breast: Secondary | ICD-10-CM | POA: Diagnosis not present

## 2018-01-20 ENCOUNTER — Encounter: Payer: Self-pay | Admitting: Emergency Medicine

## 2018-02-03 ENCOUNTER — Ambulatory Visit: Payer: Self-pay | Admitting: Psychiatry

## 2018-02-06 ENCOUNTER — Ambulatory Visit: Payer: Self-pay | Admitting: Psychiatry

## 2018-02-08 ENCOUNTER — Ambulatory Visit: Payer: Self-pay | Admitting: Psychiatry

## 2018-02-08 ENCOUNTER — Ambulatory Visit: Payer: Medicare Other | Admitting: Psychiatry

## 2018-02-08 DIAGNOSIS — Z79899 Other long term (current) drug therapy: Secondary | ICD-10-CM | POA: Diagnosis not present

## 2018-02-08 DIAGNOSIS — F319 Bipolar disorder, unspecified: Secondary | ICD-10-CM

## 2018-02-08 NOTE — Progress Notes (Signed)
[facility]  Patient ID: Kaitlyn Lowe,  MRN: [id number]  PCP: Shirlean Mylar, MD  Date of Evaluation: 02/08/2018 Time spent:15 minutes  Chief Complaint:  Chief Complaint    Follow-up    bipolar  HISTORY/CURRENT STATUS: HPI Great. No unusual mood swings depression. Patient reports stable mood and denies depressed or irritable moods.  Patient denies any recent difficulty with anxiety.  Patient denies difficulty with sleep initiation or maintenance. Denies appetite disturbance.  Patient reports that energy and motivation have been good.  Patient denies any difficulty with concentration.  Patient denies any suicidal ideation.  Still doing some travel.   Individual Medical History/ Review of Systems: Changes? :some daily back pain. S?P fusion 10 yrs ago.  Allergies: Codeine  Current Medications:  Current Outpatient Medications:  .  Cholecalciferol (VITAMIN D) 2000 units CAPS, Take 1 capsule by mouth daily., Disp: , Rfl:  .  docusate sodium (COLACE) 100 MG capsule, Take 100 mg by mouth 2 (two) times daily as needed for mild constipation., Disp: , Rfl:  .  FLUoxetine (PROZAC) 10 MG capsule, Take 10 mg by mouth daily. , Disp: , Rfl:  .  lithium carbonate 300 MG capsule, Take 300 mg by mouth 2 (two) times daily with a meal.  , Disp: , Rfl:  .  naproxen sodium (ANAPROX) 220 MG tablet, Take 220 mg by mouth 2 (two) times daily with a meal., Disp: , Rfl:  .  oxybutynin (DITROPAN-XL) 10 MG 24 hr tablet, Take 10 mg by mouth at bedtime., Disp: , Rfl:  .  traZODone (DESYREL) 100 MG tablet, Take 100 mg by mouth at bedtime.  , Disp: , Rfl:  .  estradiol (ESTRACE) 0.5 MG tablet, Take 0.5 mg by mouth daily. , Disp: , Rfl:  Medication Side Effects: none  Family Medical/ Social History: Changes? Yes retired.  Temp caring for afriend.  MENTAL HEALTH EXAM:  There were no vitals taken for this visit.There is no height or weight on file to calculate BMI.  General Appearance: Casual  Eye  Contact:  Good  Speech:  Normal Rate  Volume:  Normal  Mood:  Euthymic  Affect:  Appropriate  Thought Process:  Goal Directed  Orientation:  Full (Time, Place, and Person)  Thought Content: Logical   Suicidal Thoughts:  No  Homicidal Thoughts:  No  Memory:  WNL  Judgement:  Good  Insight:  Good  Psychomotor Activity:  Normal  Concentration:  Concentration: Good  Recall:  Good  Fund of Knowledge: Good  Language: Good  Assets:  Communication Skills Desire for Improvement Financial Resources/Insurance Housing Leisure Time Physical Health Social Support Talents/Skills Transportation  ADL's:  Intact  Cognition: WNL  Prognosis:  Good   Lab Results  Component Value Date   LITHIUM 0.42 (L) 02/22/2009   NA 140 10/18/2012   BUN 17 10/18/2012   CREATININE 0.84 10/18/2012   WBC 9.6 10/18/2012     DIAGNOSES:    ICD-10-CM   1. Bipolar I disorder (HCC) F31.9     Receiving Psychotherapy: No    RECOMMENDATIONS:  Greater than 50% of face to face time with patient was spent on counseling and coordination of care. We discussed longstanding bipolar disorder which has been under good control with lithium alone.  She has had excellent control with lithium.  We discussed the long-term side effect risk with lithium.  Discussed the need to get laboratory testing soon.  She agrees.  No indication for change.  Follow-up 1 year  Iona Hansen  Romualdo Bolk, MD

## 2018-03-06 ENCOUNTER — Telehealth: Payer: Self-pay | Admitting: Psychiatry

## 2018-03-06 ENCOUNTER — Other Ambulatory Visit: Payer: Self-pay

## 2018-03-06 MED ORDER — TRAZODONE HCL 100 MG PO TABS: 100.0000 mg | ORAL_TABLET | Freq: Every day | ORAL | 0 refills | Status: DC

## 2018-03-06 NOTE — Telephone Encounter (Signed)
Need to clarify pharmacy none listed on [address] [facility]

## 2018-03-06 NOTE — Telephone Encounter (Signed)
Pharmacy is [facility], escribed to them.

## 2018-03-06 NOTE — Telephone Encounter (Signed)
Pt. Left  V-mail. [facility] Seen 10/30 in office.

## 2018-05-06 ENCOUNTER — Other Ambulatory Visit: Payer: Self-pay | Admitting: Psychiatry

## 2018-05-13 LAB — BASIC METABOLIC PANEL
BUN: 14 mg/dL (ref 7–25)
CO2: 28 mmol/L (ref 20–32)
Calcium: 10.9 mg/dL — ABNORMAL HIGH (ref 8.6–10.4)
Chloride: 107 mmol/L (ref 98–110)
Creat: 0.85 mg/dL (ref 0.50–0.99)
Glucose, Bld: 84 mg/dL (ref 65–99)
Potassium: 4.3 mmol/L (ref 3.5–5.3)
Sodium: 141 mmol/L (ref 135–146)

## 2018-05-13 LAB — TSH: TSH: 2.71 mIU/L (ref 0.40–4.50)

## 2018-05-13 LAB — LITHIUM LEVEL: Lithium Lvl: 0.6 mmol/L (ref 0.6–1.2)

## 2018-06-03 ENCOUNTER — Other Ambulatory Visit: Payer: Self-pay | Admitting: Psychiatry

## 2018-08-05 ENCOUNTER — Other Ambulatory Visit: Payer: Self-pay | Admitting: Psychiatry

## 2018-12-19 ENCOUNTER — Other Ambulatory Visit: Payer: Self-pay

## 2018-12-19 DIAGNOSIS — Z20822 Contact with and (suspected) exposure to covid-19: Secondary | ICD-10-CM

## 2018-12-21 LAB — NOVEL CORONAVIRUS, NAA: SARS-CoV-2, NAA: NOT DETECTED

## 2019-01-08 ENCOUNTER — Encounter (HOSPITAL_COMMUNITY): Payer: Self-pay

## 2019-01-08 ENCOUNTER — Other Ambulatory Visit: Payer: Self-pay

## 2019-01-08 ENCOUNTER — Emergency Department (HOSPITAL_COMMUNITY)
Admission: EM | Admit: 2019-01-08 | Discharge: 2019-01-08 | Disposition: A | Discharge status: Home / Self Care | Payer: Medicare Other | Attending: Emergency Medicine | Admitting: Emergency Medicine

## 2019-01-08 ENCOUNTER — Emergency Department (HOSPITAL_COMMUNITY): Payer: Medicare Other

## 2019-01-08 DIAGNOSIS — S0990XA Unspecified injury of head, initial encounter: Secondary | ICD-10-CM | POA: Diagnosis not present

## 2019-01-08 DIAGNOSIS — W010XXA Fall on same level from slipping, tripping and stumbling without subsequent striking against object, initial encounter: Secondary | ICD-10-CM | POA: Diagnosis not present

## 2019-01-08 DIAGNOSIS — Z79899 Other long term (current) drug therapy: Secondary | ICD-10-CM | POA: Diagnosis not present

## 2019-01-08 DIAGNOSIS — Y9389 Activity, other specified: Secondary | ICD-10-CM | POA: Insufficient documentation

## 2019-01-08 DIAGNOSIS — Y999 Unspecified external cause status: Secondary | ICD-10-CM | POA: Diagnosis not present

## 2019-01-08 DIAGNOSIS — Y9289 Other specified places as the place of occurrence of the external cause: Secondary | ICD-10-CM | POA: Diagnosis not present

## 2019-01-08 LAB — CBC
HCT: 41.8 % (ref 36.0–46.0)
Hemoglobin: 13.1 g/dL (ref 12.0–15.0)
MCH: 30.3 pg (ref 26.0–34.0)
MCHC: 31.3 g/dL (ref 30.0–36.0)
MCV: 96.5 fL (ref 80.0–100.0)
Platelets: 269 10*3/uL (ref 150–400)
RBC: 4.33 MIL/uL (ref 3.87–5.11)
RDW: 12.6 % (ref 11.5–15.5)
WBC: 7.4 10*3/uL (ref 4.0–10.5)
nRBC: 0 % (ref 0.0–0.2)

## 2019-01-08 LAB — BASIC METABOLIC PANEL
Anion gap: 10 (ref 5–15)
BUN: 16 mg/dL (ref 8–23)
CO2: 26 mmol/L (ref 22–32)
Calcium: 9.9 mg/dL (ref 8.9–10.3)
Chloride: 103 mmol/L (ref 98–111)
Creatinine, Ser: 0.82 mg/dL (ref 0.44–1.00)
GFR calc Af Amer: >60 mL/min (ref >60)
GFR calc non Af Amer: >60 mL/min (ref >60)
Glucose, Bld: 114 mg/dL — ABNORMAL HIGH (ref 70–99)
Potassium: 3.7 mmol/L (ref 3.5–5.1)
Sodium: 139 mmol/L (ref 135–145)

## 2019-01-08 LAB — CBG MONITORING, ED: Glucose-Capillary: 120 mg/dL — ABNORMAL HIGH (ref 70–99)

## 2019-01-08 MED ORDER — SODIUM CHLORIDE 0.9% FLUSH: 3.0000 mL | Freq: Once | INTRAVENOUS | Status: DC

## 2019-01-08 MED ORDER — ACETAMINOPHEN 325 MG PO TABS: 650.0000 mg | ORAL_TABLET | Freq: Once | ORAL | Status: DC

## 2019-01-08 MED ORDER — NAPROXEN 250 MG PO TABS
375.0000 mg | ORAL_TABLET | Freq: Once | ORAL | Status: AC
  Administered 2019-01-08: 375 mg via ORAL
  Filled 2019-01-08: qty 2

## 2019-01-08 NOTE — ED Provider Notes (Signed)
[facility] Provider Note   CSN: [id number] Arrival date & time: 01/08/19  1507     History   Chief Complaint Chief Complaint  Patient presents with  . Fall  . Head Injury    HPI Kaitlyn Lowe is a 68 y.o. female.     HPI Patient presented to the emergency room for evaluation of a head injury.  Patient states she was at the car wash when she slipped and fell and struck the back of her head.  Patient did not lose consciousness.  She did have some dizziness and nausea after the fall.  EMS gave her Zofran.  Patient complains of pain in the back of her head still.  She denies any neck pain.  No numbness or weakness. Past Medical History:  Diagnosis Date  . Depression     Patient Active Problem List   Diagnosis Date Noted  . SBO (small bowel obstruction) (HCC) 10/18/2012    Past Surgical History:  Procedure Laterality Date  . ABDOMINAL HYSTERECTOMY  1994  . BACK SURGERY    . COLON SURGERY    . KNEE ARTHROSCOPY     bilateral knees.     OB History   No obstetric history on file.      Home Medications    Prior to Admission medications   Medication Sig Start Date End Date Taking? Authorizing Provider  Cholecalciferol (VITAMIN D) 2000 units CAPS Take 1 capsule by mouth daily.    [provider]  docusate sodium (COLACE) 100 MG capsule Take 100 mg by mouth 2 (two) times daily as needed for mild constipation.    [provider]  estradiol (ESTRACE) 0.5 MG tablet Take 0.5 mg by mouth daily.     [provider]  FLUoxetine (PROZAC) 10 MG capsule TAKE 1 CAPSULE BY MOUTH EVERY DAY 08/06/18   Cottle, Steva Readyarey G Jr., MD  lithium carbonate 300 MG capsule Take 300 mg by mouth 2 (two) times daily with a meal.      [provider]  naproxen sodium (ANAPROX) 220 MG tablet Take 220 mg by mouth 2 (two) times daily with a meal.    [provider]  oxybutynin (DITROPAN-XL) 10 MG 24 hr tablet Take 10 mg by  mouth at bedtime.    [provider]  traZODone (DESYREL) 100 MG tablet TAKE 1 TABLET(100 MG) BY MOUTH AT BEDTIME 06/05/18   Cottle, Steva Readyarey G Jr., MD    Family History Family History  Problem Relation Age of Onset  . Arthritis Mother   . Mental illness Mother   . Hypertension Mother     Social History Social History   Tobacco Use  . Smoking status: Never Smoker  . Smokeless tobacco: Never Used  Substance Use Topics  . Alcohol use: Yes    Comment: three times a week 2 drinks at a time.  . Drug use: No     Allergies   Codeine   Review of Systems Review of Systems  All other systems reviewed and are negative.    Physical Exam Updated Vital Signs BP (!) 162/75   Pulse 72   Temp 98.5 F (36.9 C) (Oral)   Resp 18   SpO2 97%   Physical Exam Vitals signs and nursing note reviewed.  Constitutional:      General: She is not in acute distress.    Appearance: She is well-developed.  HENT:     Head: Normocephalic.     Comments:  Contusion to posterior occiput    Right Ear: External ear normal.     Left Ear: External ear normal.  Eyes:     General: No scleral icterus.       Right eye: No discharge.        Left eye: No discharge.     Conjunctiva/sclera: Conjunctivae normal.  Neck:     Musculoskeletal: Neck supple.     Trachea: No tracheal deviation.  Cardiovascular:     Rate and Rhythm: Normal rate and regular rhythm.  Pulmonary:     Effort: Pulmonary effort is normal. No respiratory distress.     Breath sounds: Normal breath sounds. No stridor. No wheezing or rales.  Abdominal:     General: Bowel sounds are normal. There is no distension.     Palpations: Abdomen is soft.     Tenderness: There is no abdominal tenderness. There is no guarding or rebound.  Musculoskeletal:        General: No tenderness.     Right shoulder: She exhibits no tenderness, no bony tenderness and no swelling.     Left shoulder: She exhibits no tenderness, no bony tenderness and  no swelling.     Right wrist: She exhibits no tenderness, no bony tenderness and no swelling.     Left wrist: She exhibits no tenderness, no bony tenderness and no swelling.     Right hip: She exhibits normal range of motion, no tenderness, no bony tenderness and no swelling.     Left hip: She exhibits normal range of motion, no tenderness and no bony tenderness.     Right ankle: She exhibits no swelling. No tenderness.     Left ankle: She exhibits no swelling. No tenderness.     Cervical back: She exhibits no tenderness, no bony tenderness and no swelling.     Thoracic back: She exhibits no tenderness, no bony tenderness and no swelling.     Lumbar back: She exhibits no tenderness, no bony tenderness and no swelling.  Skin:    General: Skin is warm and dry.     Findings: No rash.  Neurological:     Mental Status: She is alert.     Cranial Nerves: No cranial nerve deficit (no facial droop, extraocular movements intact, no slurred speech).     Sensory: No sensory deficit.     Motor: No abnormal muscle tone or seizure activity.     Coordination: Coordination normal.      ED Treatments / Results  Labs (all labs ordered are listed, but only abnormal results are displayed) Labs Reviewed  BASIC METABOLIC PANEL - Abnormal; Notable for the following components:      Result Value   Glucose, Bld 114 (*)    All other components within normal limits  CBG MONITORING, ED - Abnormal; Notable for the following components:   Glucose-Capillary 120 (*)    All other components within normal limits  CBC    EKG None  Radiology Ct Head Wo Contrast  Result Date: 01/08/2019 CLINICAL DATA:  Slipped and fell backwards hitting back of head with posterior pain. EXAM: CT HEAD WITHOUT CONTRAST TECHNIQUE: Contiguous axial images were obtained from the base of the skull through the vertex without intravenous contrast. COMPARISON:  None. FINDINGS: Brain: Ventricles, cisterns and other CSF spaces are within  normal. There is no mass, mass effect, shift of midline structures or acute hemorrhage. No evidence of acute infarction. Subtle chronic ischemic microvascular disease. Vascular: No hyperdense vessel or unexpected  calcification. Skull: Normal. Negative for fracture or focal lesion. Sinuses/Orbits: No acute finding. Other: None. IMPRESSION: No acute findings. Electronically Signed   By: Elberta Fortis M.D.   On: 01/08/2019 16:07    Procedures Procedures (including critical care time)  Medications Ordered in ED Medications  sodium chloride flush (NS) 0.9 % injection 3 mL (has no administration in time range)  naproxen (NAPROSYN) tablet 375 mg (has no administration in time range)     Initial Impression / Assessment and Plan / ED Course  I have reviewed the triage vital signs and the nursing notes.  Pertinent labs & imaging results that were available during my care of the patient were reviewed by me and considered in my medical decision making (see chart for details).   Patient's CT scan does not show any evidence of serious injury.  Laboratory tests are unremarkable.  Patient had a cervical spine collar on when I evaluated her but she has no cervical spine tenderness.  Patient has been waiting for 7 hours before she was placed in a bed for evaluation.  She still has no cervical spine tenderness on exam.  I do not feel that cervical spine imaging is necessary at this time.  Final Clinical Impressions(s) / ED Diagnoses   Final diagnoses:  Minor head injury, initial encounter    ED Discharge Orders    None       Linwood Dibbles, MD 01/08/19 2212

## 2019-01-08 NOTE — ED Notes (Signed)
Patient verbalizes understanding of discharge instructions. Opportunity for questioning and answers were provided. Armband removed by staff, pt discharged from ED ambulatory w/ support person  

## 2019-01-08 NOTE — ED Triage Notes (Signed)
Pt bib ems for fall, pt slipped on water at a car wash, fell backwards and hit her head, no blood thinners, no loc. Pt c.o dizziness and nausea post fall. Pt given 4mg  zofran en route. C collar in place due to c.o neck pain. Pt a.o at this time.   Bp 186/92 P 70

## 2019-01-08 NOTE — Discharge Instructions (Addendum)
Take over-the-counter medications for pain, return to the ED as needed for worsening symptoms.

## 2019-01-08 NOTE — ED Notes (Signed)
Called to recheck vitals no answer 

## 2019-03-12 ENCOUNTER — Other Ambulatory Visit: Payer: Self-pay | Admitting: Psychiatry

## 2019-03-13 ENCOUNTER — Other Ambulatory Visit: Payer: Self-pay

## 2019-03-13 MED ORDER — OXYBUTYNIN CHLORIDE ER 10 MG PO TB24: 10.0000 mg | ORAL_TABLET | Freq: Every day | ORAL | 0 refills | Status: DC

## 2019-03-14 ENCOUNTER — Other Ambulatory Visit: Payer: Self-pay | Admitting: Psychiatry

## 2019-03-14 NOTE — Telephone Encounter (Signed)
Schedule apt

## 2019-03-18 NOTE — Telephone Encounter (Signed)
30 day submitted, asking 90 day but over due for apt. Last visit 01/2018 Message to set up apt

## 2019-04-16 ENCOUNTER — Encounter: Payer: Self-pay | Admitting: Psychiatry

## 2019-04-16 ENCOUNTER — Ambulatory Visit (INDEPENDENT_AMBULATORY_CARE_PROVIDER_SITE_OTHER): Payer: Medicare PPO | Admitting: Psychiatry

## 2019-04-16 DIAGNOSIS — F5105 Insomnia due to other mental disorder: Secondary | ICD-10-CM

## 2019-04-16 DIAGNOSIS — F319 Bipolar disorder, unspecified: Secondary | ICD-10-CM

## 2019-04-16 DIAGNOSIS — Z79899 Other long term (current) drug therapy: Secondary | ICD-10-CM | POA: Diagnosis not present

## 2019-04-16 MED ORDER — FLUOXETINE HCL 10 MG PO CAPS: 10.0000 mg | ORAL_CAPSULE | Freq: Every day | ORAL | 3 refills | Status: DC

## 2019-04-16 MED ORDER — TRAZODONE HCL 100 MG PO TABS: 100.0000 mg | ORAL_TABLET | Freq: Every evening | ORAL | 3 refills | Status: DC | PRN

## 2019-04-16 MED ORDER — LITHIUM CARBONATE 300 MG PO CAPS: 600.0000 mg | ORAL_CAPSULE | Freq: Every day | ORAL | 3 refills | Status: DC

## 2019-04-16 NOTE — Progress Notes (Signed)
Kaitlyn Lowe [id number] 21-May-1950 69 y.o.   Virtual Visit via WebEX  I connected with pt by WebEx and verified that I am speaking with the correct person using two identifiers.   I discussed the limitations, risks, security and privacy concerns of performing an evaluation and management service by Kaitlyn Lowe and the availability of in person appointments. I also discussed with the patient that there may be a patient responsible charge related to this service. The patient expressed understanding and agreed to proceed.  I discussed the assessment and treatment plan with the patient. The patient was provided an opportunity to ask questions and all were answered. The patient agreed with the plan and demonstrated an understanding of the instructions.   The patient was advised to call back or seek an in-person evaluation if the symptoms worsen or if the condition fails to improve as anticipated.  I provided 30 minutes of video time during this encounter. The call started at 330 and ended at 4:00. The patient was located at home and the provider was located office.   Subjective:   Patient ID:  Kaitlyn Lowe is a 69 y.o. (DOB 07-11-1950) female.  Chief Complaint:  Chief Complaint  Patient presents with  . Follow-up    Medication Management  . Other    Bipolar    HPI Kaitlyn Lowe presents to the office today for follow-up of bipolar disorder.  Last seen February 08, 2018 with no med changes.  She has had a period of several years of stability.  Still doing well without problems except itching really only at night.  No rash.  No history of allergies.   Patient reports stable mood and denies depressed or irritable moods.  Patient denies any recent difficulty with anxiety.  Patient denies difficulty with sleep initiation or maintenance. Denies appetite disturbance.  Patient reports that energy and motivation have been good.  Patient denies any difficulty with concentration.  Patient denies any suicidal  ideation.   Review of Systems:  Review of Systems  Skin:       itching  Neurological: Negative for tremors and weakness.    Medications: I have reviewed the patient's current medications.  Current Outpatient Medications  Medication Sig Dispense Refill  . Cholecalciferol (VITAMIN D) 2000 units CAPS Take 1 capsule by mouth daily.    Marland Kitchen docusate sodium (COLACE) 100 MG capsule Take 100 mg by mouth 2 (two) times daily as needed for mild constipation.    Marland Kitchen FLUoxetine (PROZAC) 10 MG capsule Take 1 capsule (10 mg total) by mouth daily. 90 capsule 3  . lithium carbonate 300 MG capsule Take 2 capsules (600 mg total) by mouth at bedtime. 180 capsule 3  . naproxen sodium (ANAPROX) 220 MG tablet Take 220 mg by mouth 2 (two) times daily with a meal.    . traZODone (DESYREL) 100 MG tablet Take 1 tablet (100 mg total) by mouth at bedtime as needed for sleep. 90 tablet 3   No current facility-administered medications for this visit.    Medication Side Effects: None  Allergies:  Allergies  Allergen Reactions  . Codeine Nausea Only    Past Medical History:  Diagnosis Date  . Depression     Family History  Problem Relation Age of Onset  . Arthritis Mother   . Mental illness Mother   . Hypertension Mother     Social History   Socioeconomic History  . Marital status: Divorced    Spouse name: Not on file  . Number of  children: Not on file  . Years of education: Not on file  . Highest education level: Not on file  Occupational History  . Not on file  Tobacco Use  . Smoking status: Never Smoker  . Smokeless tobacco: Never Used  Substance and Sexual Activity  . Alcohol use: Yes    Comment: three times a week 2 drinks at a time.  . Drug use: No  . Sexual activity: Not on file  Other Topics Concern  . Not on file  Social History Narrative  . Not on file   Social Determinants of Health   Financial Resource Strain:   . Difficulty of Paying Living Expenses: Not on file  Food  Insecurity:   . Worried About Programme researcher, broadcasting/film/video in the Last Year: Not on file  . Ran Out of Food in the Last Year: Not on file  Transportation Needs:   . Lack of Transportation (Medical): Not on file  . Lack of Transportation (Non-Medical): Not on file  Physical Activity:   . Days of Exercise per Week: Not on file  . Minutes of Exercise per Session: Not on file  Stress:   . Feeling of Stress : Not on file  Social Connections:   . Frequency of Communication with Friends and Family: Not on file  . Frequency of Social Gatherings with Friends and Family: Not on file  . Attends Religious Services: Not on file  . Active Member of Clubs or Organizations: Not on file  . Attends Banker Meetings: Not on file  . Marital Status: Not on file  Intimate Partner Violence:   . Fear of Current or Ex-Partner: Not on file  . Emotionally Abused: Not on file  . Physically Abused: Not on file  . Sexually Abused: Not on file    Past Medical History, Surgical history, Social history, and Family history were reviewed and updated as appropriate.   Please see review of systems for further details on the patient's review from today.   Objective:   Physical Exam:  There were no vitals taken for this visit.  Physical Exam Neurological:     Mental Status: She is alert and oriented to person, place, and time.     Cranial Nerves: No dysarthria.  Psychiatric:        Attention and Perception: Attention and perception normal.        Mood and Affect: Mood normal. Mood is not anxious or depressed.        Speech: Speech normal.        Behavior: Behavior is cooperative.        Thought Content: Thought content normal. Thought content is not paranoid or delusional. Thought content does not include homicidal or suicidal ideation. Thought content does not include homicidal or suicidal plan.        Cognition and Memory: Cognition and memory normal.        Judgment: Judgment normal.     Comments:  Insight intact     Lab Review:     Component Value Date/Time   NA 139 01/08/2019 1520   K 3.7 01/08/2019 1520   CL 103 01/08/2019 1520   CO2 26 01/08/2019 1520   GLUCOSE 114 (H) 01/08/2019 1520   BUN 16 01/08/2019 1520   CREATININE 0.82 01/08/2019 1520   CREATININE 0.85 05/12/2018 1440   CALCIUM 9.9 01/08/2019 1520   PROT 6.9 10/17/2012 1537   ALBUMIN 4.1 10/17/2012 1537   AST 21 10/17/2012 1537  ALT 17 10/17/2012 1537   ALKPHOS 36 (L) 10/17/2012 1537   BILITOT 0.7 10/17/2012 1537   GFRNONAA >60 01/08/2019 1520   GFRAA >60 01/08/2019 1520       Component Value Date/Time   WBC 7.4 01/08/2019 1520   RBC 4.33 01/08/2019 1520   HGB 13.1 01/08/2019 1520   HCT 41.8 01/08/2019 1520   PLT 269 01/08/2019 1520   MCV 96.5 01/08/2019 1520   MCH 30.3 01/08/2019 1520   MCHC 31.3 01/08/2019 1520   RDW 12.6 01/08/2019 1520   LYMPHSABS 1.0 10/17/2012 1537   MONOABS 0.5 10/17/2012 1537   EOSABS 0.1 10/17/2012 1537   BASOSABS 0.0 10/17/2012 1537    Lithium Lvl  Date Value Ref Range Status  05/12/2018 0.6 0.6 - 1.2 mmol/L Final     No results found for: PHENYTOIN, PHENOBARB, VALPROATE, CBMZ   .res Assessment: Plan:    Kaitlyn Lowe was seen today for follow-up and other.  Diagnoses and all orders for this visit:  Bipolar I disorder ([place]) -     Lithium level -     TSH -     FLUoxetine (PROZAC) 10 MG capsule; Take 1 capsule (10 mg total) by mouth daily. -     lithium carbonate 300 MG capsule; Take 2 capsules (600 mg total) by mouth at bedtime.  Lithium use -     Lithium level -     TSH  Insomnia due to mental condition -     traZODone (DESYREL) 100 MG tablet; Take 1 tablet (100 mg total) by mouth at bedtime as needed for sleep.   Very stable bipolar with good response to LITHIUM and fluoxetine.  Discussed mood cycling risk with fluoxetine or other SSRIs in bipolar patients but she has benefited and been stable.  Sleep stable with trazodone.  Counseled patient regarding  potential benefits, risks, and side effects of lithium to include potential risk of lithium affecting thyroid and renal function.  Discussed need for periodic lab monitoring to determine drug level and to assess for potential adverse effects.  Counseled patient regarding signs and symptoms of lithium toxicity and advised that they notify office immediately or seek urgent medical attention if experiencing these signs and symptoms.  Patient advised to contact office with any questions or concerns.  Disc itching and workup from PCP.  Disc use of antihistamines.    Creatinine was 0.82 in September, calcium 9.9 and overall BMP was unremarkable at that time with a normal CBC.  Lithium level May 12, 2018 was 0.6.  Needs another lithium level. Check TSH.  Cont meds without change.  BC stability.  Continue meds follow up for a year.  [name] Parents, MD, DFAPA'  Please see After Visit Summary for patient specific instructions.  No future appointments.  Orders Placed This Encounter  Procedures  . Lithium level  . TSH    -------------------------------

## 2019-06-08 ENCOUNTER — Other Ambulatory Visit: Payer: Self-pay | Admitting: Psychiatry

## 2019-06-08 DIAGNOSIS — F5105 Insomnia due to other mental disorder: Secondary | ICD-10-CM

## 2020-05-25 ENCOUNTER — Other Ambulatory Visit: Payer: Self-pay | Admitting: Psychiatry

## 2020-05-25 DIAGNOSIS — F319 Bipolar disorder, unspecified: Secondary | ICD-10-CM

## 2020-05-26 IMAGING — CT CT HEAD W/O CM
1 series · 16 of 30 positions shown, 20 images · non-contrast
Comparison: None.

CLINICAL DATA: Slipped and fell backwards hitting back of head with
posterior pain.

EXAM:
CT HEAD WITHOUT CONTRAST
TECHNIQUE: Contiguous axial images were obtained from the base of the skull
through the vertex without intravenous contrast.

[Series 3: head 5.0 h30s · axial · 0.45mm/px · z∈[-164,-14]mm · 16 of 34 slices shown, 20 images]
[im 2/34  brain]
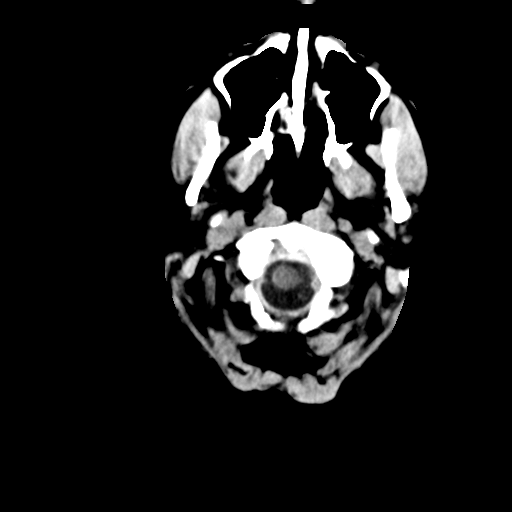
[im 2/34  bone]
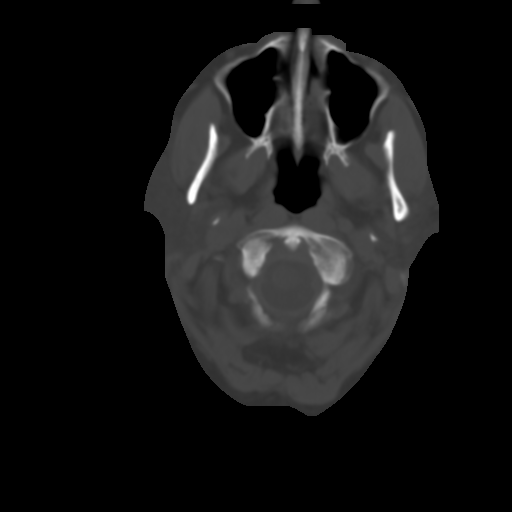
[im 4/34  brain]
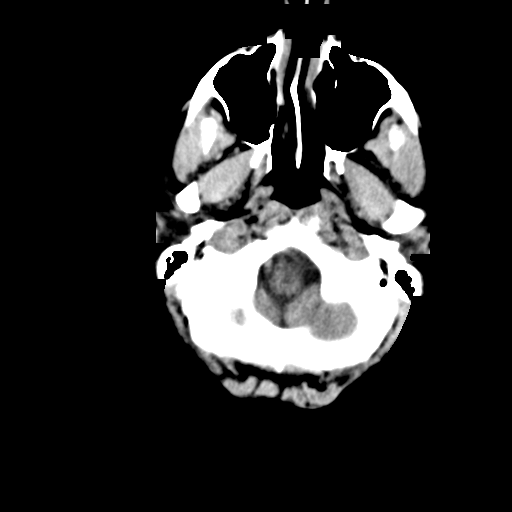
[im 6/34  brain]
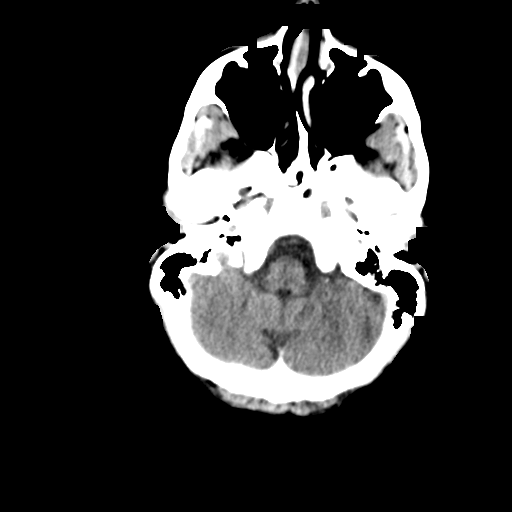
[im 8/34  brain]
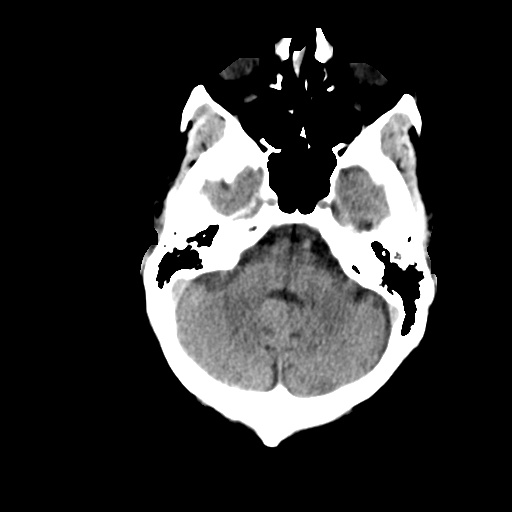
[im 10/34  brain]
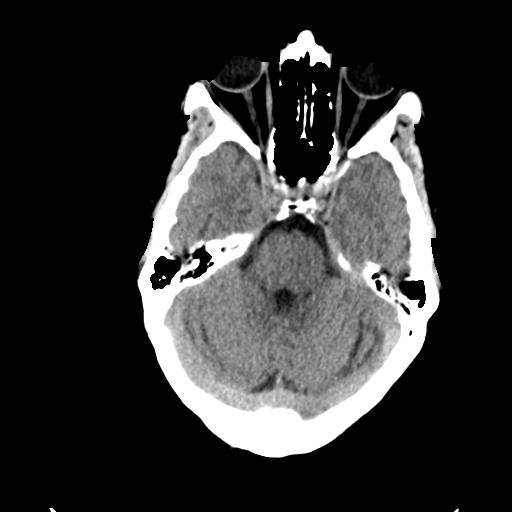
[im 10/34  bone]
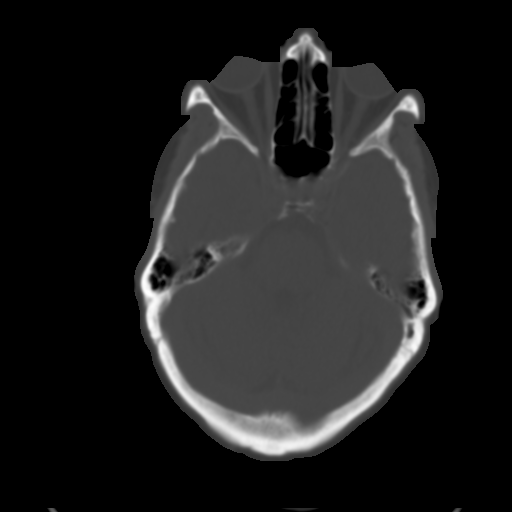
[im 12/34  brain]
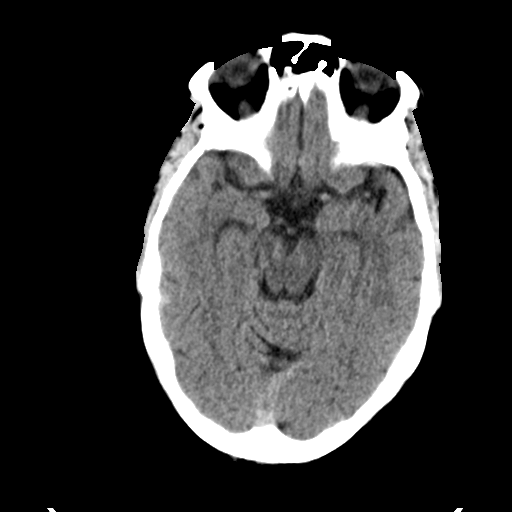
[im 14/34  brain]
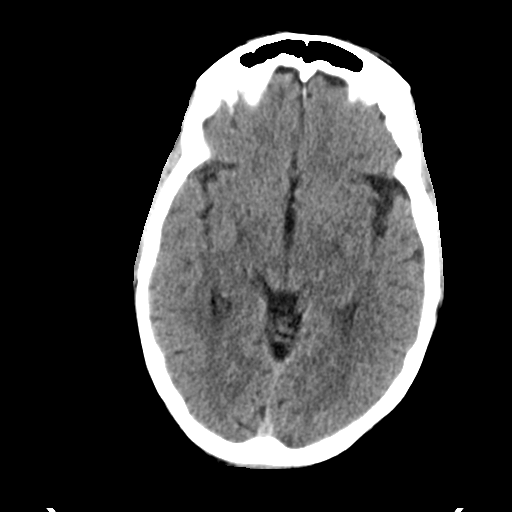
[im 16/34  brain]
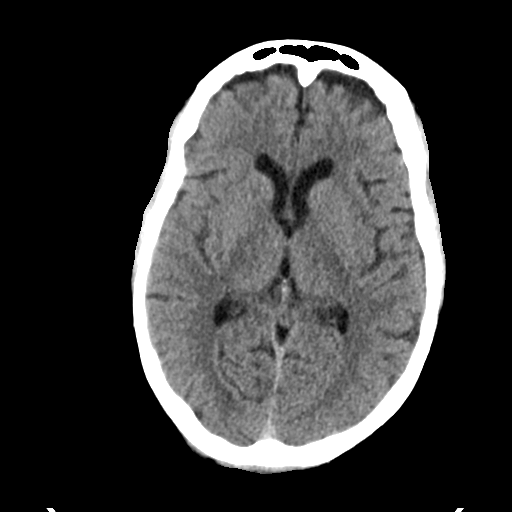
[im 18/34  brain]
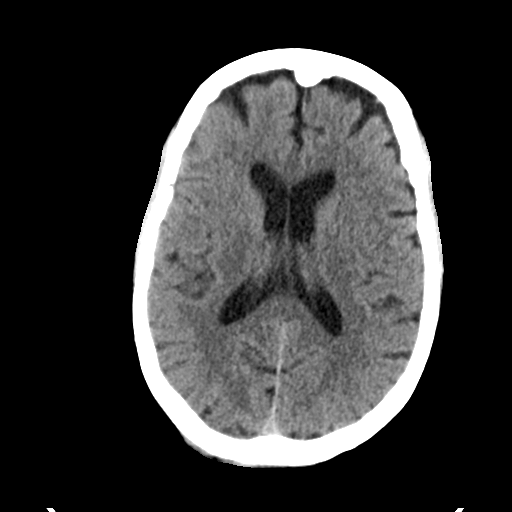
[im 18/34  bone]
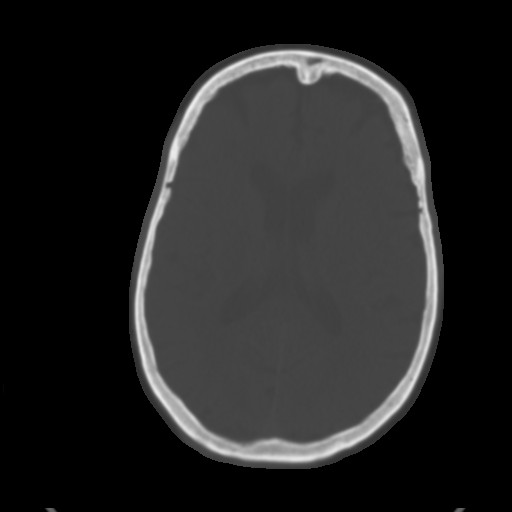
[im 20/34  brain]
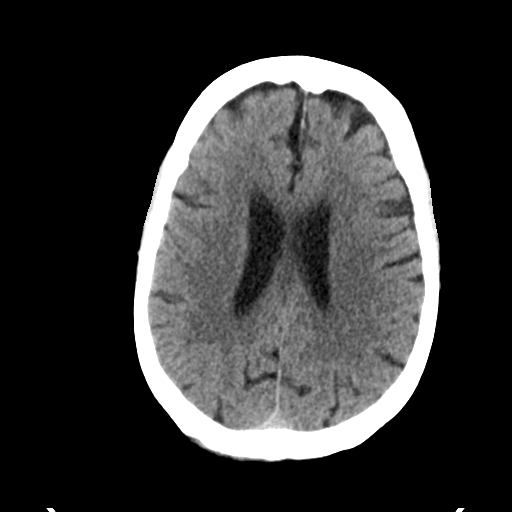
[im 22/34  brain]
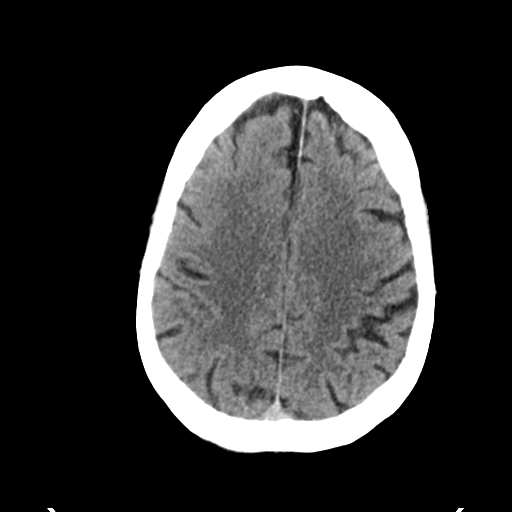
[im 24/34  brain]
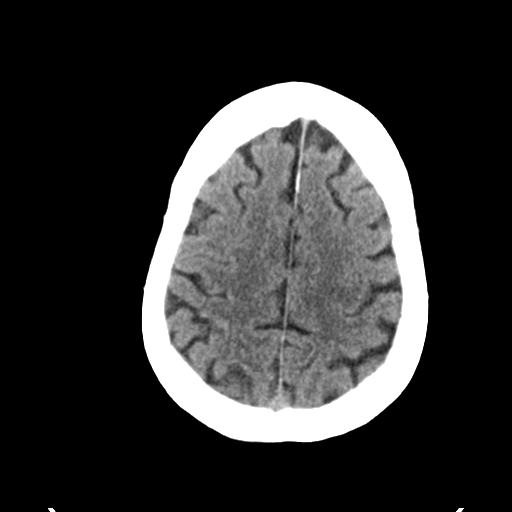
[im 26/34  brain]
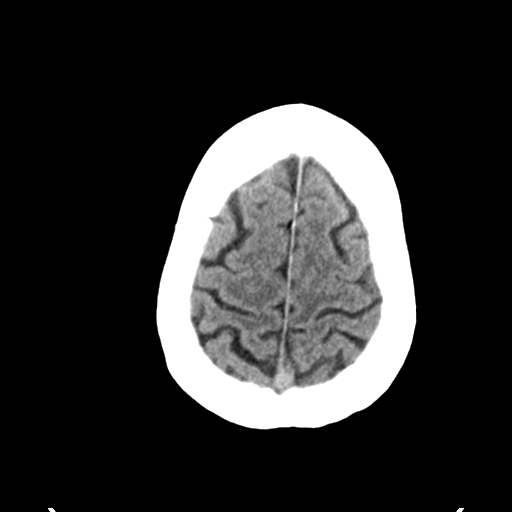
[im 26/34  bone]
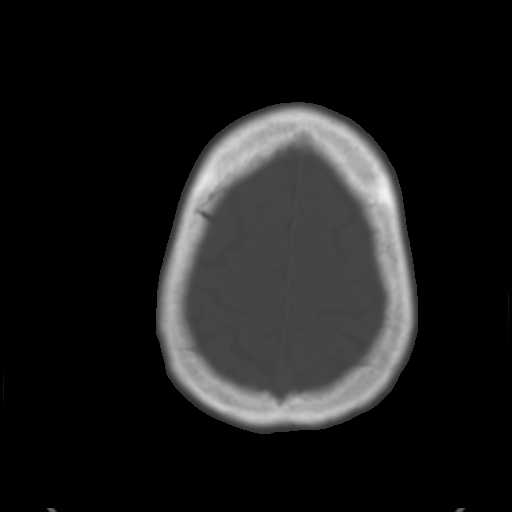
[im 28/34  brain]
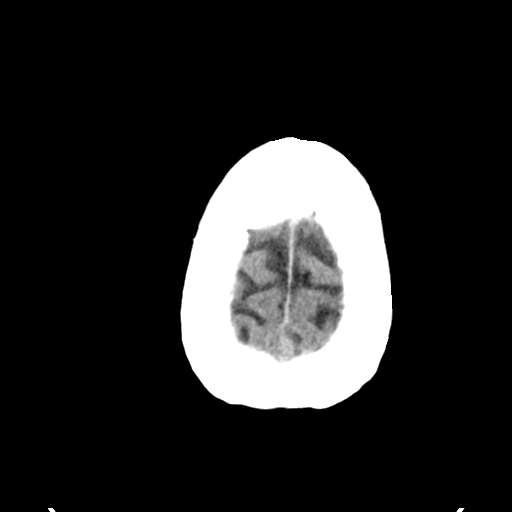
[im 30/34  brain]
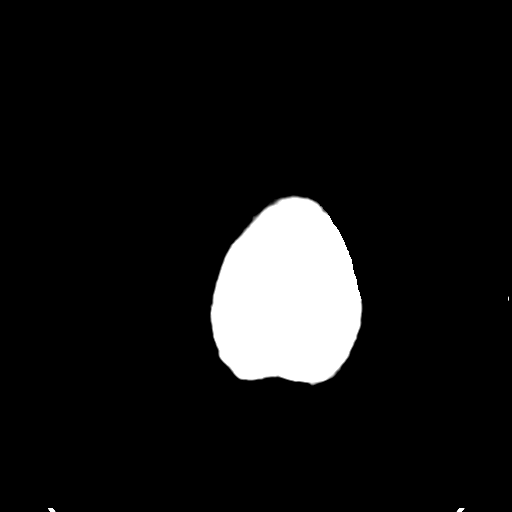
[im 32/34  brain]
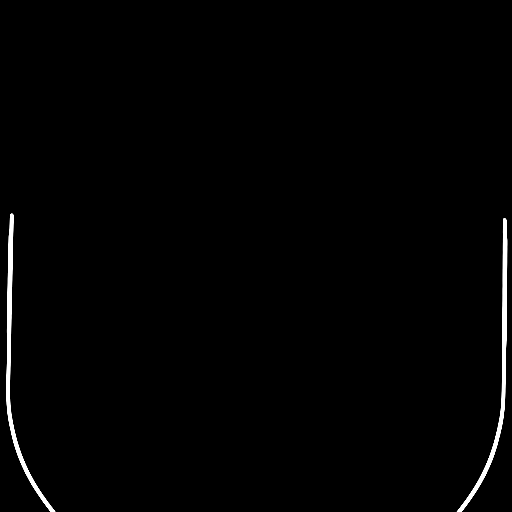

[16 of 30 positions shown; findings below may reference images not displayed]

FINDINGS: Brain: Ventricles, cisterns and other CSF spaces are within normal.
There is no mass, mass effect, shift of midline structures or acute
hemorrhage. No evidence of acute infarction. Subtle chronic ischemic
microvascular disease.

Vascular: No hyperdense vessel or unexpected calcification.

Skull: Normal. Negative for fracture or focal lesion.

Sinuses/Orbits: No acute finding.

Other: None.
IMPRESSION: No acute findings.

## 2020-05-26 NOTE — Telephone Encounter (Signed)
Call to RS

## 2020-05-27 ENCOUNTER — Telehealth: Payer: Self-pay | Admitting: Psychiatry

## 2020-05-27 ENCOUNTER — Other Ambulatory Visit: Payer: Self-pay

## 2020-05-27 NOTE — Telephone Encounter (Signed)
Pt is scheduled for an appointment on tomorrow 2/16 the purpose to obtain refills. She stated a Lithium level is probably needed before seen. She is inquiring where to go to get the lab drawn. Please advise. # [phone].

## 2020-05-27 NOTE — Telephone Encounter (Signed)
Rtc to patient and discussed her Lab order, explained to her that the order was sent to [facility] if that was okay. She did not have a preference, informed her of [facility] on [address] informed her I would also fax orders to that location to make sure there was no issue.  She will be in on 05/28/2020

## 2020-05-27 NOTE — Telephone Encounter (Signed)
Left message for pt to schedule follow up

## 2020-05-28 ENCOUNTER — Ambulatory Visit: Payer: Medicare PPO | Admitting: Psychiatry

## 2020-05-28 ENCOUNTER — Other Ambulatory Visit: Payer: Self-pay | Admitting: Psychiatry

## 2020-05-28 DIAGNOSIS — F319 Bipolar disorder, unspecified: Secondary | ICD-10-CM

## 2020-05-28 DIAGNOSIS — F5105 Insomnia due to other mental disorder: Secondary | ICD-10-CM

## 2020-05-28 MED ORDER — FLUOXETINE HCL 10 MG PO CAPS: 10.0000 mg | ORAL_CAPSULE | Freq: Every day | ORAL | 0 refills | Status: DC

## 2020-05-28 MED ORDER — LITHIUM CARBONATE 300 MG PO CAPS: ORAL_CAPSULE | ORAL | 0 refills | Status: DC

## 2020-05-28 NOTE — Telephone Encounter (Signed)
Pt is needing RF of both Prozac and Trazodone sent in to [facility] on [address] On file.  Her appt today was supposed to be telephone, but office scheduled it incorrectly. Dr. Jennelle Human said for her to [facility]

## 2020-05-29 LAB — LITHIUM LEVEL: Lithium Lvl: 0.7 mmol/L (ref 0.6–1.2)

## 2020-05-29 LAB — TSH: TSH: 2.89 mIU/L (ref 0.40–4.50)

## 2020-06-03 ENCOUNTER — Encounter: Payer: Self-pay | Admitting: Psychiatry

## 2020-06-03 ENCOUNTER — Telehealth (INDEPENDENT_AMBULATORY_CARE_PROVIDER_SITE_OTHER): Payer: Medicare PPO | Admitting: Psychiatry

## 2020-06-03 DIAGNOSIS — Z79899 Other long term (current) drug therapy: Secondary | ICD-10-CM | POA: Diagnosis not present

## 2020-06-03 DIAGNOSIS — F5105 Insomnia due to other mental disorder: Secondary | ICD-10-CM | POA: Diagnosis not present

## 2020-06-03 DIAGNOSIS — F319 Bipolar disorder, unspecified: Secondary | ICD-10-CM | POA: Diagnosis not present

## 2020-06-03 DIAGNOSIS — L299 Pruritus, unspecified: Secondary | ICD-10-CM | POA: Diagnosis not present

## 2020-06-03 MED ORDER — FLUOXETINE HCL 10 MG PO CAPS: 10.0000 mg | ORAL_CAPSULE | Freq: Every day | ORAL | 3 refills | Status: DC

## 2020-06-03 MED ORDER — LITHIUM CARBONATE 300 MG PO CAPS: ORAL_CAPSULE | ORAL | 3 refills | Status: DC

## 2020-06-03 MED ORDER — HYDROXYZINE PAMOATE 25 MG PO CAPS: ORAL_CAPSULE | ORAL | 1 refills | Status: DC

## 2020-06-03 NOTE — Progress Notes (Signed)
Kaitlyn Lowe [id number] [birth year] 70 y.o.   Virtual Visit via Telephone Note  I connected with pt by telephone and verified that I am speaking with the correct person using two identifiers.   I discussed the limitations, risks, security and privacy concerns of performing an evaluation and management service by telephone and the availability of in person appointments. I also discussed with the patient that there may be a patient responsible charge related to this service. The patient expressed understanding and agreed to proceed.  I discussed the assessment and treatment plan with the patient. The patient was provided an opportunity to ask questions and all were answered. The patient agreed with the plan and demonstrated an understanding of the instructions.   The patient was advised to call back or seek an in-person evaluation if the symptoms worsen or if the condition fails to improve as anticipated.  I provided 30 minutes of non-face-to-face time during this encounter. The call started at 9:30 and ended at 10:00. The patient was located at home and the provider was located office.   Subjective:   Patient ID:  Kaitlyn Lowe is a 70 y.o. (DOB 09-14-1950) female.  Chief Complaint:  Chief Complaint  Patient presents with  . Follow-up  . Bipolar I disorder (HCC)  . Sleeping Problem    HPI Kaitlyn Lowe presents to the office today for follow-up of bipolar disorder.  seen February 08, 2018 with no med changes.  She has had a period of several years of stability.  06/03/20 appt with following noted: Stable mood.  Concerns about memory in conversation.  Not forgetting bills. No SE with meds and no mood swings.   Some trouble getting to sleep.  No caffeine after 5 PM.  2 glasses of tea at lunch.  Sleep interrupted by nocturia 3-4 times and may take an hour or 2 to go to sleep.  Sleep problems for a year.  Not drowsy daytime.  No naps.  Reads until 11 and might get up 930 or 1030. Still  itching and that contributes to insomnia also.  Still doing well without problems except itching really only at night.  No rash.  No history of allergies.   Patient reports stable mood and denies depressed or irritable moods.  Patient denies any recent difficulty with anxiety.  Denies appetite disturbance.  Patient reports that energy and motivation have been good.  Patient denies any difficulty with concentration.  Patient denies any suicidal ideation.   Review of Systems:  Review of Systems  Skin: Negative for rash.       Itching at night  Neurological: Negative for tremors and weakness.    Medications: I have reviewed the patient's current medications.  Current Outpatient Medications  Medication Sig Dispense Refill  . azelastine (ASTELIN) 0.1 % nasal spray Place into both nostrils 2 (two) times daily. Use in each nostril as directed    . benzonatate (TESSALON) 200 MG capsule Take 200 mg by mouth 3 (three) times daily as needed for cough.    . Cholecalciferol (VITAMIN D) 2000 units CAPS Take 1 capsule by mouth daily.    . hydrOXYzine (VISTARIL) 25 MG capsule 1-2 capsules at night for itching 60 capsule 1  . naproxen sodium (ANAPROX) 220 MG tablet Take 220 mg by mouth daily as needed.    . traZODone (DESYREL) 100 MG tablet TAKE 1 TABLET(100 MG) BY MOUTH AT BEDTIME AS NEEDED FOR SLEEP 90 tablet 0  . docusate sodium (COLACE) 100 MG capsule Take 100  mg by mouth 2 (two) times daily as needed for mild constipation. (Patient not taking: Reported on 06/03/2020)    . FLUoxetine (PROZAC) 10 MG capsule Take 1 capsule (10 mg total) by mouth daily. 90 capsule 3  . lithium carbonate 300 MG capsule TAKE 2 CAPSULES(600 MG) BY MOUTH AT BEDTIME 180 capsule 3   No current facility-administered medications for this visit.    Medication Side Effects: None  Allergies:  Allergies  Allergen Reactions  . Codeine Nausea Only    Past Medical History:  Diagnosis Date  . Depression     Family History   Problem Relation Age of Onset  . Arthritis Mother   . Mental illness Mother   . Hypertension Mother     Social History   Socioeconomic History  . Marital status: Divorced    Spouse name: Not on file  . Number of children: Not on file  . Years of education: Not on file  . Highest education level: Not on file  Occupational History  . Not on file  Tobacco Use  . Smoking status: Never Smoker  . Smokeless tobacco: Never Used  Substance and Sexual Activity  . Alcohol use: Yes    Comment: three times a week 2 drinks at a time.  . Drug use: No  . Sexual activity: Not on file  Other Topics Concern  . Not on file  Social History Narrative  . Not on file   Social Determinants of Health   Financial Resource Strain: Not on file  Food Insecurity: Not on file  Transportation Needs: Not on file  Physical Activity: Not on file  Stress: Not on file  Social Connections: Not on file  Intimate Partner Violence: Not on file    Past Medical History, Surgical history, Social history, and Family history were reviewed and updated as appropriate.   Please see review of systems for further details on the patient's review from today.   Objective:   Physical Exam:  There were no vitals taken for this visit.  Physical Exam Neurological:     Mental Status: She is alert and oriented to person, place, and time.     Cranial Nerves: No dysarthria.  Psychiatric:        Attention and Perception: Attention and perception normal.        Mood and Affect: Mood normal. Mood is not anxious or depressed.        Speech: Speech normal. Speech is not rapid and pressured.        Behavior: Behavior is cooperative.        Thought Content: Thought content normal. Thought content is not paranoid or delusional. Thought content does not include homicidal or suicidal ideation. Thought content does not include homicidal or suicidal plan.        Cognition and Memory: Cognition and memory normal.        Judgment:  Judgment normal.     Comments: Insight intact     Lab Review:     Component Value Date/Time   NA 139 01/08/2019 1520   K 3.7 01/08/2019 1520   CL 103 01/08/2019 1520   CO2 26 01/08/2019 1520   GLUCOSE 114 (H) 01/08/2019 1520   BUN 16 01/08/2019 1520   CREATININE 0.82 01/08/2019 1520   CREATININE 0.85 05/12/2018 1440   CALCIUM 9.9 01/08/2019 1520   PROT 6.9 10/17/2012 1537   ALBUMIN 4.1 10/17/2012 1537   AST 21 10/17/2012 1537   ALT 17 10/17/2012 1537  ALKPHOS 36 (L) 10/17/2012 1537   BILITOT 0.7 10/17/2012 1537   GFRNONAA >60 01/08/2019 1520   GFRAA >60 01/08/2019 1520       Component Value Date/Time   WBC 7.4 01/08/2019 1520   RBC 4.33 01/08/2019 1520   HGB 13.1 01/08/2019 1520   HCT 41.8 01/08/2019 1520   PLT 269 01/08/2019 1520   MCV 96.5 01/08/2019 1520   MCH 30.3 01/08/2019 1520   MCHC 31.3 01/08/2019 1520   RDW 12.6 01/08/2019 1520   LYMPHSABS 1.0 10/17/2012 1537   MONOABS 0.5 10/17/2012 1537   EOSABS 0.1 10/17/2012 1537   BASOSABS 0.0 10/17/2012 1537    Lithium Lvl  Date Value Ref Range Status  05/28/2020 0.7 0.6 - 1.2 mmol/L Final     No results found for: PHENYTOIN, PHENOBARB, VALPROATE, CBMZ   .res Assessment: Plan:    Alezandra was seen today for follow-up, bipolar i disorder (hcc) and sleeping problem.  Diagnoses and all orders for this visit:  Bipolar I disorder (HCC) -     Basic metabolic panel -     Lithium level -     TSH -     FLUoxetine (PROZAC) 10 MG capsule; Take 1 capsule (10 mg total) by mouth daily. -     lithium carbonate 300 MG capsule; TAKE 2 CAPSULES(600 MG) BY MOUTH AT BEDTIME  Insomnia due to mental condition  Lithium use -     Basic metabolic panel -     Lithium level -     TSH  Pruritic condition -     hydrOXYzine (VISTARIL) 25 MG capsule; 1-2 capsules at night for itching   Very stable bipolar with good response to LITHIUM and fluoxetine.  Discussed mood cycling risk with fluoxetine or other SSRIs in bipolar  patients but she has benefited and been stable.  Sleep problems with trazodone.  Spending too much time in bed is fragmenting sleep.  Disc sleep hygiene and sleep restriction.  Suggest reading about it and disc this at length. Disc itching and workup from PCP.  Disc use of antihistamines.    Try Vistaril 25-50 mg HS for sleep and itching in place of trazodone.  Counseled patient regarding potential benefits, risks, and side effects of lithium to include potential risk of lithium affecting thyroid and renal function.  Discussed need for periodic lab monitoring to determine drug level and to assess for potential adverse effects.  Counseled patient regarding signs and symptoms of lithium toxicity and advised that they notify office immediately or seek urgent medical attention if experiencing these signs and symptoms.  Patient advised to contact office with any questions or concerns.  Creatinine was 0.82 in September, calcium 9.9 and overall BMP was unremarkable at that time with a normal CBC.  Lithium level May 12, 2018 was 0.6.  05/28/2020 labs  lithium level 0.7 TSH 2.89 are stable Continue lithium 600 HS and fluoxetine 10 Cont meds without change.  BC stability.  Continue meds follow up for a year.  Meredith Staggers, MD, DFAPA'  Please see After Visit Summary for patient specific instructions.  No future appointments.  Orders Placed This Encounter  Procedures  . Basic metabolic panel  . Lithium level  . TSH    -------------------------------

## 2020-06-12 DIAGNOSIS — K432 Incisional hernia without obstruction or gangrene: Secondary | ICD-10-CM | POA: Diagnosis not present

## 2020-06-16 ENCOUNTER — Other Ambulatory Visit: Payer: Self-pay | Admitting: General Surgery

## 2020-06-16 DIAGNOSIS — K432 Incisional hernia without obstruction or gangrene: Secondary | ICD-10-CM

## 2020-07-04 ENCOUNTER — Ambulatory Visit
Admission: RE | Admit: 2020-07-04 | Discharge: 2020-07-04 | Disposition: A | Discharge status: Home / Self Care | Payer: Medicare PPO | Source: Ambulatory Visit | Attending: General Surgery | Admitting: General Surgery

## 2020-07-04 DIAGNOSIS — K5730 Diverticulosis of large intestine without perforation or abscess without bleeding: Secondary | ICD-10-CM | POA: Diagnosis not present

## 2020-07-04 DIAGNOSIS — K439 Ventral hernia without obstruction or gangrene: Secondary | ICD-10-CM | POA: Diagnosis not present

## 2020-07-04 DIAGNOSIS — K432 Incisional hernia without obstruction or gangrene: Secondary | ICD-10-CM

## 2020-07-04 MED ORDER — IOPAMIDOL (ISOVUE-300) INJECTION 61%
100.0000 mL | Freq: Once | INTRAVENOUS | Status: AC | PRN
  Administered 2020-07-04: 100 mL via INTRAVENOUS

## 2020-07-09 ENCOUNTER — Ambulatory Visit: Payer: Self-pay | Admitting: General Surgery

## 2020-07-09 DIAGNOSIS — K432 Incisional hernia without obstruction or gangrene: Secondary | ICD-10-CM | POA: Diagnosis not present

## 2020-07-09 NOTE — H&P (Signed)
History of Present Illness Axel Filler MD; 07/09/2020 11:09 AM) The patient is a 70 year old female who presents with an umbilical hernia. 70 year old female comes in follow-up for CT scan. Patient appears to have a 1.8 cm incisional hernia at the umbilicus. Patient has had some pain and discomfort. This does appear to have some omentum and small bowel within the hernia.  Patient did have a previous hysterectomy as well as colon Removal last surgery was approximately 10 years ago.   Allergies (Diane Herrin, RN; 07/09/2020 10:59 AM) Codeine Phosphate *ANALGESICS - OPIOID*  Nausea. Allergies Reconciled   Medication History (Diane Herrin, RN; 07/09/2020 10:59 AM) FLUoxetine HCl (10MG  Capsule, Oral) Active. Lithium Carbonate (300MG  Capsule, Oral) Active. Oxybutynin Chloride ER (10MG  Tablet ER 24HR, Oral) Active. TraZODone HCl (100MG  Tablet, Oral) Active. Omeprazole (20MG  Capsule DR, Oral) Active. Vitamin D (1000UNIT Tablet, Oral) Active. Colace (100MG  Capsule, Oral) Active. Medications Reconciled    Review of Systems , MD; 07/09/2020 11:12 AM) General Present- Appetite Loss. Not Present- Chills, Fatigue, Fever, Night Sweats, Weight Gain and Weight Loss. HEENT Present- Ringing in the Ears. Not Present- Earache, Hearing Loss, Hoarseness, Nose Bleed, Oral Ulcers, Seasonal Allergies, Sinus Pain, Sore Throat, Visual Disturbances, Wears glasses/contact lenses and Yellow Eyes. Respiratory Not Present- Bloody sputum, Chronic Cough, Difficulty Breathing, Snoring and Wheezing. Breast Present- Nipple Discharge. Not Present- Breast Mass, Breast Pain and Skin Changes. Cardiovascular Present- Leg Cramps and Swelling of Extremities. Not Present- Chest Pain, Difficulty Breathing Lying Down, Palpitations, Rapid Heart Rate and Shortness of Breath. Gastrointestinal Present- Change in Bowel Habits. Not Present- Abdominal Pain, Bloating, Bloody Stool, Chronic diarrhea,  Constipation, Difficulty Swallowing, Excessive gas, Gets full quickly at meals, Hemorrhoids, Indigestion, Nausea, Rectal Pain and Vomiting. Female Genitourinary Not Present- Frequency, Nocturia, Painful Urination, Pelvic Pain and Urgency. Neurological Present- Headaches, Seizures, Tremor and Weakness. Not Present- Decreased Memory, Fainting, Numbness, Tingling and Trouble walking. Psychiatric Present- Depression. Not Present- Anxiety, Bipolar, Change in Sleep Pattern, Fearful and Frequent crying. Endocrine Present- Cold Intolerance. Not Present- Excessive Hunger, Hair Changes, Heat Intolerance, Hot flashes and New Diabetes. Hematology Not Present- Blood Thinners, Easy Bruising, Excessive bleeding, Gland problems, HIV and Persistent Infections. All other systems negative   Physical Exam MD; 07/09/2020 11:10 AM) The physical exam findings are as follows: Note: Constitutional: No acute distress, conversant, appears stated age  Eyes: Anicteric sclerae, moist conjunctiva, no lid lag  Neck: No thyromegaly, trachea midline, no cervical lymphadenopathy  Lungs: Clear to auscultation biilaterally, normal respiratory effot  Cardiovascular: regular rate & rhythm, no murmurs, no peripheal edema, pedal pulses 2+  GI: Soft, no masses or hepatosplenomegaly, non-tender to palpation  MSK: Normal gait, no clubbing cyanosis, edema  Skin: No rashes, palpation reveals normal skin turgor  Psychiatric: Appropriate judgment and insight, oriented to person, place, and time  Abdomen Inspection Hernias - Umbilical hernia - Reducible.    Assessment & Plan MD; 07/09/2020 11:11 AM) INCISIONAL HERNIA, WITHOUT OBSTRUCTION OR GANGRENE (K43.2) Impression: Patient is a 70 year old female, comes in secondary to an incisional hernia. This appears to be 1.8 centimeters in size per CT scan. I did review the CT scan personally .  1. The patient will like to proceed to the operating  room for laparoscopic incisional hernia repair with mesh.  2. I discussed with the patient the signs and symptoms of incarceration and strangulation and the need to proceed to the ER should they occur.  3. I discussed with the patient the risks and benefits  of the procedure to include but not limited to: Infection, bleeding, damage to surrounding structures, possible need for further surgery, possible nerve pain, and possible recurrence. The patient was understanding and wishes to proceed.

## 2020-07-21 DIAGNOSIS — M170 Bilateral primary osteoarthritis of knee: Secondary | ICD-10-CM | POA: Diagnosis not present

## 2020-08-12 DIAGNOSIS — Z01818 Encounter for other preprocedural examination: Secondary | ICD-10-CM | POA: Diagnosis not present

## 2020-08-12 DIAGNOSIS — R399 Unspecified symptoms and signs involving the genitourinary system: Secondary | ICD-10-CM | POA: Diagnosis not present

## 2020-08-12 DIAGNOSIS — Z5181 Encounter for therapeutic drug level monitoring: Secondary | ICD-10-CM | POA: Diagnosis not present

## 2020-08-12 DIAGNOSIS — M179 Osteoarthritis of knee, unspecified: Secondary | ICD-10-CM | POA: Diagnosis not present

## 2020-08-12 DIAGNOSIS — E782 Mixed hyperlipidemia: Secondary | ICD-10-CM | POA: Diagnosis not present

## 2020-08-12 DIAGNOSIS — K429 Umbilical hernia without obstruction or gangrene: Secondary | ICD-10-CM | POA: Diagnosis not present

## 2020-08-12 NOTE — Progress Notes (Signed)
Surgical Instructions    Your procedure is scheduled on Monday, May 9th, 2022.  Report to [facility] Main Entrance "A" at 08:45 A.M., then check in with the Admitting office.  Call this number if you have problems the morning of surgery:  [phone]   If you have any questions prior to your surgery date call [phone]: Open Monday-Friday 8am-4pm    Remember:  Do not eat after midnight the night before your surgery  You may drink clear liquids until 07:45 A.M. the morning of your surgery.   Clear liquids allowed are: Water, Non-Citrus Juices (without pulp), Carbonated Beverages, Clear Tea, Black Coffee Only, and Gatorade  Patient Instructions  . The night before surgery:  o No food after midnight. ONLY clear liquids after midnight  . The day of surgery (if you do NOT have diabetes):  o Drink ONE (1) Pre-Surgery Clear Ensure by 07:45 A.M. the morning of surgery. Drink in one sitting. Do not sip.  o This drink was given to you during your hospital  pre-op appointment visit.  o Nothing else to drink after completing the  Pre-Surgery Clear Ensure.         If you have questions, please contact your surgeon's office.     Take these medicines the morning of surgery with A SIP OF WATER   FLUoxetine (PROZAC) Omeprazole docusate sodium (COLACE)   If needed:  azelastine (ASTELIN)  benzonatate (TESSALON)  As of today, STOP taking any Aspirin (unless otherwise instructed by your surgeon) Aleve, Naproxen, Ibuprofen, Motrin, Advil, Goody's, BC's, all herbal medications, fish oil, and all vitamins.                     Do not wear jewelry, make up, or nail polish            Do not wear lotions, powders, perfumes, or deodorant.            Do not shave 48 hours prior to surgery.              Do not bring valuables to the hospital.            [facility] is not responsible for any belongings or valuables.  Do NOT Smoke (Tobacco/Vaping) or drink Alcohol 24 hours prior to your  procedure If you use a CPAP at night, you may bring all equipment for your overnight stay.   Contacts, glasses, dentures or bridgework may not be worn into surgery, please bring cases for these belongings   For patients admitted to the hospital, discharge time will be determined by your treatment team.   Patients discharged the day of surgery will not be allowed to drive home, and someone needs to stay with them for 24 hours.    Special instructions:   [place]- Preparing For Surgery  Before surgery, you can play an important role. Because skin is not sterile, your skin needs to be as free of germs as possible. You can reduce the number of germs on your skin by washing with CHG (chlorahexidine gluconate) Soap before surgery.  CHG is an antiseptic cleaner which kills germs and bonds with the skin to continue killing germs even after washing.    Oral Hygiene is also important to reduce your risk of infection.  Remember - BRUSH YOUR TEETH THE MORNING OF SURGERY WITH YOUR REGULAR TOOTHPASTE  Please do not use if you have an allergy to CHG or antibacterial soaps. If your skin becomes reddened/irritated stop  using the CHG.  Do not shave (including legs and underarms) for at least 48 hours prior to first CHG shower. It is OK to shave your face.  Please follow these instructions carefully.   1. Shower the NIGHT BEFORE SURGERY and the MORNING OF SURGERY  2. If you chose to wash your hair, wash your hair first as usual with your normal shampoo.  3. After you shampoo, rinse your hair and body thoroughly to remove the shampoo.  4. Use CHG Soap as you would any other liquid soap. You can apply CHG directly to the skin and wash gently with a scrungie or a clean washcloth.   5. Apply the CHG Soap to your body ONLY FROM THE NECK DOWN.  Do not use on open wounds or open sores. Avoid contact with your eyes, ears, mouth and genitals (private parts). Wash Face and genitals (private parts)  with your  normal soap.   6. Wash thoroughly, paying special attention to the area where your surgery will be performed.  7. Thoroughly rinse your body with warm water from the neck down.  8. DO NOT shower/wash with your normal soap after using and rinsing off the CHG Soap.  9. Pat yourself dry with a CLEAN TOWEL.  10. Wear CLEAN PAJAMAS to bed the night before surgery  11. Place CLEAN SHEETS on your bed the night before your surgery  12. DO NOT SLEEP WITH PETS.   Day of Surgery: Take a shower with CHG soap. Wear Clean/Comfortable clothing the morning of surgery Do not apply any deodorants/lotions.   Remember to brush your teeth WITH YOUR REGULAR TOOTHPASTE.   Please read over the following fact sheets that you were given.

## 2020-08-13 ENCOUNTER — Other Ambulatory Visit: Payer: Self-pay

## 2020-08-13 ENCOUNTER — Encounter (HOSPITAL_COMMUNITY): Payer: Self-pay

## 2020-08-13 ENCOUNTER — Encounter (HOSPITAL_COMMUNITY)
Admission: RE | Admit: 2020-08-13 | Discharge: 2020-08-13 | Disposition: A | Discharge status: Home / Self Care | Payer: Medicare PPO | Source: Ambulatory Visit | Attending: General Surgery | Admitting: General Surgery

## 2020-08-13 DIAGNOSIS — Z01812 Encounter for preprocedural laboratory examination: Secondary | ICD-10-CM | POA: Diagnosis not present

## 2020-08-13 LAB — BASIC METABOLIC PANEL
Anion gap: 4 — ABNORMAL LOW (ref 5–15)
BUN: 17 mg/dL (ref 8–23)
CO2: 29 mmol/L (ref 22–32)
Calcium: 10.5 mg/dL — ABNORMAL HIGH (ref 8.9–10.3)
Chloride: 105 mmol/L (ref 98–111)
Creatinine, Ser: 0.87 mg/dL (ref 0.44–1.00)
GFR, Estimated: >60 mL/min (ref >60)
Glucose, Bld: 106 mg/dL — ABNORMAL HIGH (ref 70–99)
Potassium: 4.1 mmol/L (ref 3.5–5.1)
Sodium: 138 mmol/L (ref 135–145)

## 2020-08-13 NOTE — Progress Notes (Signed)
PCP: Shirlean Mylar, MD Cardiologist: denies  EKG: na CXR: na ECHO: denies Stress Test: denies Cardiac Cath: denies  Fasting Blood Sugar- na Checks Blood Sugar__ na_ times a day  OSA/CPAP: No  ASA/Blood Thinners: No  Covid test DOS.  Patient is having a big birthday party Saturday 5/7.  Therefore, covid test DOS.  Patient denies shortness of breath, fever, cough, and chest pain at PAT appointment.  Patient verbalized understanding of instructions provided today at the PAT appointment.  Patient asked to review instructions at home and day of surgery.

## 2020-08-14 ENCOUNTER — Other Ambulatory Visit (HOSPITAL_COMMUNITY): Payer: Medicare PPO

## 2020-08-17 NOTE — Anesthesia Preprocedure Evaluation (Addendum)
Anesthesia Evaluation  Patient identified by MRN, date of birth, ID band Patient awake    Reviewed: Allergy & Precautions, NPO status , Patient's Chart, lab work & pertinent test results  Airway Mallampati: II  TM Distance: >3 FB Neck ROM: Full    Dental no notable dental hx. (+) Teeth Intact, Dental Advisory Given   Pulmonary neg pulmonary ROS,    Pulmonary exam normal breath sounds clear to auscultation       Cardiovascular Exercise Tolerance: Good Normal cardiovascular exam Rhythm:Regular Rate:Normal     Neuro/Psych PSYCHIATRIC DISORDERS Depression    GI/Hepatic GERD  Medicated and Controlled,  Endo/Other  negative endocrine ROS  Renal/GU negative Renal ROSK+ 4.1 Cr 0.87     Musculoskeletal negative musculoskeletal ROS (+)   Abdominal   Peds  Hematology   Anesthesia Other Findings ALL: Codeine  Reproductive/Obstetrics                           Anesthesia Physical Anesthesia Plan  ASA: II  Anesthesia Plan: General   Post-op Pain Management:    Induction: Intravenous  PONV Risk Score and Plan: 4 or greater and Treatment may vary due to age or medical condition, Ondansetron, Dexamethasone, Midazolam and Amisulpride  Airway Management Planned: Oral ETT  Additional Equipment: None  Intra-op Plan:   Post-operative Plan: Extubation in OR  Informed Consent: I have reviewed the patients History and Physical, chart, labs and discussed the procedure including the risks, benefits and alternatives for the proposed anesthesia with the patient or authorized representative who has indicated his/her understanding and acceptance.     Dental advisory given  Plan Discussed with: CRNA  Anesthesia Plan Comments: (GA w lidocaine infusion)       Anesthesia Quick Evaluation

## 2020-08-17 NOTE — H&P (Signed)
  History of Present Illness The patient is a 70 year old female who presents with an umbilical hernia. 70 year old female comes in follow-up for CT scan. Patient appears to have a 1.8 cm incisional hernia at the umbilicus. Patient has had some pain and discomfort. This does appear to have some omentum and small bowel within the hernia.  Patient did have a previous hysterectomy as well as colon Removal last surgery was approximately 10 years ago.   Allergies Codeine Phosphate *ANALGESICS - OPIOID*  Nausea. Allergies Reconciled   Medication History FLUoxetine HCl (10MG  Capsule, Oral) Active. Lithium Carbonate (300MG  Capsule, Oral) Active. Oxybutynin Chloride ER (10MG  Tablet ER 24HR, Oral) Active. TraZODone HCl (100MG  Tablet, Oral) Active. Omeprazole (20MG  Capsule DR, Oral) Active. Vitamin D (1000UNIT Tablet, Oral) Active. Colace (100MG  Capsule, Oral) Active. Medications Reconciled    Review of Systems  General Present- Appetite Loss. Not Present- Chills, Fatigue, Fever, Night Sweats, Weight Gain and Weight Loss. HEENT Present- Ringing in the Ears. Not Present- Earache, Hearing Loss, Hoarseness, Nose Bleed, Oral Ulcers, Seasonal Allergies, Sinus Pain, Sore Throat, Visual Disturbances, Wears glasses/contact lenses and Yellow Eyes. Respiratory Not Present- Bloody sputum, Chronic Cough, Difficulty Breathing, Snoring and Wheezing. Breast Present- Nipple Discharge. Not Present- Breast Mass, Breast Pain and Skin Changes. Cardiovascular Present- Leg Cramps and Swelling of Extremities. Not Present- Chest Pain, Difficulty Breathing Lying Down, Palpitations, Rapid Heart Rate and Shortness of Breath. Gastrointestinal Present- Change in Bowel Habits. Not Present- Abdominal Pain, Bloating, Bloody Stool, Chronic diarrhea, Constipation, Difficulty Swallowing, Excessive gas, Gets full quickly at meals, Hemorrhoids, Indigestion, Nausea, Rectal Pain and Vomiting. Female Genitourinary  Not Present- Frequency, Nocturia, Painful Urination, Pelvic Pain and Urgency. Neurological Present- Headaches, Seizures, Tremor and Weakness. Not Present- Decreased Memory, Fainting, Numbness, Tingling and Trouble walking. Psychiatric Present- Depression. Not Present- Anxiety, Bipolar, Change in Sleep Pattern, Fearful and Frequent crying. Endocrine Present- Cold Intolerance. Not Present- Excessive Hunger, Hair Changes, Heat Intolerance, Hot flashes and New Diabetes. Hematology Not Present- Blood Thinners, Easy Bruising, Excessive bleeding, Gland problems, HIV and Persistent Infections. All other systems negative   Physical Exam The physical exam findings are as follows: Note: Constitutional: No acute distress, conversant, appears stated age  Eyes: Anicteric sclerae, moist conjunctiva, no lid lag  Neck: No thyromegaly, trachea midline, no cervical lymphadenopathy  Lungs: Clear to auscultation biilaterally, normal respiratory effot  Cardiovascular: regular rate & rhythm, no murmurs, no peripheal edema, pedal pulses 2+  GI: Soft, no masses or hepatosplenomegaly, non-tender to palpation  MSK: Normal gait, no clubbing cyanosis, edema  Skin: No rashes, palpation reveals normal skin turgor  Psychiatric: Appropriate judgment and insight, oriented to person, place, and time  Abdomen Inspection Hernias - Umbilical hernia - Reducible.    Assessment & Plan INCISIONAL HERNIA, WITHOUT OBSTRUCTION OR GANGRENE (K43.2) Impression: Patient is a 70 year old female, comes in secondary to an incisional hernia. This appears to be 1.8 centimeters in size per CT scan. I did review the CT scan personally .  1. The patient will like to proceed to the operating room for laparoscopic incisional hernia repair with mesh.  2. I discussed with the patient the signs and symptoms of incarceration and strangulation and the need to proceed to the ER should they occur.  3. I discussed with  the patient the risks and benefits of the procedure to include but not limited to: Infection, bleeding, damage to surrounding structures, possible need for further surgery, possible nerve pain, and possible recurrence. The patient was understanding and wishes to proceed.

## 2020-08-18 ENCOUNTER — Encounter (HOSPITAL_COMMUNITY)
Admission: RE | Disposition: A | Discharge status: Home / Self Care | Payer: Self-pay | Source: Home / Self Care | Attending: General Surgery

## 2020-08-18 ENCOUNTER — Encounter (HOSPITAL_COMMUNITY): Payer: Self-pay | Admitting: General Surgery

## 2020-08-18 ENCOUNTER — Ambulatory Visit (HOSPITAL_COMMUNITY)
Admission: RE | Admit: 2020-08-18 | Discharge: 2020-08-18 | Disposition: A | Discharge status: Home / Self Care | Payer: Medicare PPO | Attending: General Surgery | Admitting: General Surgery

## 2020-08-18 ENCOUNTER — Ambulatory Visit (HOSPITAL_COMMUNITY): Payer: Medicare PPO | Admitting: Certified Registered"

## 2020-08-18 DIAGNOSIS — Z79899 Other long term (current) drug therapy: Secondary | ICD-10-CM | POA: Insufficient documentation

## 2020-08-18 DIAGNOSIS — Z886 Allergy status to analgesic agent status: Secondary | ICD-10-CM | POA: Diagnosis not present

## 2020-08-18 DIAGNOSIS — K432 Incisional hernia without obstruction or gangrene: Secondary | ICD-10-CM | POA: Diagnosis not present

## 2020-08-18 DIAGNOSIS — K56609 Unspecified intestinal obstruction, unspecified as to partial versus complete obstruction: Secondary | ICD-10-CM | POA: Diagnosis not present

## 2020-08-18 DIAGNOSIS — K219 Gastro-esophageal reflux disease without esophagitis: Secondary | ICD-10-CM | POA: Diagnosis not present

## 2020-08-18 DIAGNOSIS — Z20822 Contact with and (suspected) exposure to covid-19: Secondary | ICD-10-CM | POA: Diagnosis not present

## 2020-08-18 DIAGNOSIS — Z885 Allergy status to narcotic agent status: Secondary | ICD-10-CM | POA: Diagnosis not present

## 2020-08-18 DIAGNOSIS — F32A Depression, unspecified: Secondary | ICD-10-CM | POA: Diagnosis not present

## 2020-08-18 HISTORY — PX: INCISIONAL HERNIA REPAIR: SHX193

## 2020-08-18 LAB — RESP PANEL BY RT-PCR (FLU A&B, COVID) ARPGX2
Influenza A by PCR: NEGATIVE
Influenza B by PCR: NEGATIVE
SARS Coronavirus 2 by RT PCR: NEGATIVE

## 2020-08-18 SURGERY — REPAIR, HERNIA, INCISIONAL, LAPAROSCOPIC
Anesthesia: General | Site: Abdomen

## 2020-08-18 MED ORDER — CHLORHEXIDINE GLUCONATE 0.12 % MT SOLN: 15.0000 mL | Freq: Once | OROMUCOSAL | Status: AC

## 2020-08-18 MED ORDER — ACETAMINOPHEN 500 MG PO TABS
ORAL_TABLET | ORAL | Status: AC
  Administered 2020-08-18: 1000 mg via ORAL
  Filled 2020-08-18: qty 2

## 2020-08-18 MED ORDER — ACETAMINOPHEN 500 MG PO TABS: 1000.0000 mg | ORAL_TABLET | ORAL | Status: AC

## 2020-08-18 MED ORDER — CHLORHEXIDINE GLUCONATE CLOTH 2 % EX PADS: 6.0000 | MEDICATED_PAD | Freq: Once | CUTANEOUS | Status: DC

## 2020-08-18 MED ORDER — PROPOFOL 10 MG/ML IV BOLUS
INTRAVENOUS | Status: AC
  Filled 2020-08-18: qty 20

## 2020-08-18 MED ORDER — STERILE WATER FOR IRRIGATION IR SOLN
Status: DC | PRN
  Administered 2020-08-18: 1000 mL

## 2020-08-18 MED ORDER — SUGAMMADEX SODIUM 200 MG/2ML IV SOLN
INTRAVENOUS | Status: DC | PRN
  Administered 2020-08-18: 200 mg via INTRAVENOUS

## 2020-08-18 MED ORDER — DEXAMETHASONE SODIUM PHOSPHATE 10 MG/ML IJ SOLN
INTRAMUSCULAR | Status: AC
  Filled 2020-08-18: qty 1

## 2020-08-18 MED ORDER — 0.9 % SODIUM CHLORIDE (POUR BTL) OPTIME
TOPICAL | Status: DC | PRN
  Administered 2020-08-18: 1000 mL

## 2020-08-18 MED ORDER — CEFAZOLIN SODIUM-DEXTROSE 2-4 GM/100ML-% IV SOLN: 2.0000 g | INTRAVENOUS | Status: DC

## 2020-08-18 MED ORDER — APREPITANT 40 MG PO CAPS: 40.0000 mg | ORAL_CAPSULE | Freq: Once | ORAL | Status: AC

## 2020-08-18 MED ORDER — MIDAZOLAM HCL 2 MG/2ML IJ SOLN
INTRAMUSCULAR | Status: AC
  Filled 2020-08-18: qty 2

## 2020-08-18 MED ORDER — PROPOFOL 10 MG/ML IV BOLUS
INTRAVENOUS | Status: DC | PRN
  Administered 2020-08-18: 110 mg via INTRAVENOUS
  Administered 2020-08-18: 20 mg via INTRAVENOUS

## 2020-08-18 MED ORDER — LIDOCAINE 2% (20 MG/ML) 5 ML SYRINGE
INTRAMUSCULAR | Status: DC | PRN
  Administered 2020-08-18: 100 mg via INTRAVENOUS

## 2020-08-18 MED ORDER — FENTANYL CITRATE (PF) 250 MCG/5ML IJ SOLN
INTRAMUSCULAR | Status: AC
  Filled 2020-08-18: qty 5

## 2020-08-18 MED ORDER — FENTANYL CITRATE (PF) 100 MCG/2ML IJ SOLN
INTRAMUSCULAR | Status: AC
  Filled 2020-08-18: qty 2

## 2020-08-18 MED ORDER — CHLORHEXIDINE GLUCONATE 0.12 % MT SOLN
OROMUCOSAL | Status: AC
  Administered 2020-08-18: 15 mL
  Filled 2020-08-18: qty 15

## 2020-08-18 MED ORDER — BUPIVACAINE HCL (PF) 0.25 % IJ SOLN
INTRAMUSCULAR | Status: AC
  Filled 2020-08-18: qty 30

## 2020-08-18 MED ORDER — MIDAZOLAM HCL 5 MG/5ML IJ SOLN
INTRAMUSCULAR | Status: DC | PRN
  Administered 2020-08-18: .5 mg via INTRAVENOUS

## 2020-08-18 MED ORDER — ENSURE PRE-SURGERY PO LIQD: 296.0000 mL | Freq: Once | ORAL | Status: DC

## 2020-08-18 MED ORDER — ACETAMINOPHEN 10 MG/ML IV SOLN: 1000.0000 mg | Freq: Once | INTRAVENOUS | Status: DC | PRN

## 2020-08-18 MED ORDER — ORAL CARE MOUTH RINSE
15.0000 mL | Freq: Once | OROMUCOSAL | Status: AC
Start: 2020-08-18 — End: 2020-08-18

## 2020-08-18 MED ORDER — DEXMEDETOMIDINE PEDS IV SYRINGE 4 MCG/ML - SIMPLE MED
8.0000 ug | Freq: Once | INTRAVENOUS | Status: DC
  Filled 2020-08-18: qty 2

## 2020-08-18 MED ORDER — ONDANSETRON HCL 4 MG/2ML IJ SOLN
INTRAMUSCULAR | Status: DC | PRN
  Administered 2020-08-18: 4 mg via INTRAVENOUS

## 2020-08-18 MED ORDER — FENTANYL CITRATE (PF) 100 MCG/2ML IJ SOLN
25.0000 ug | INTRAMUSCULAR | Status: DC | PRN
  Administered 2020-08-18 (×4): 25 ug via INTRAVENOUS

## 2020-08-18 MED ORDER — ONDANSETRON HCL 4 MG/2ML IJ SOLN
INTRAMUSCULAR | Status: AC
  Filled 2020-08-18: qty 2

## 2020-08-18 MED ORDER — LIDOCAINE 2% (20 MG/ML) 5 ML SYRINGE
INTRAMUSCULAR | Status: AC
  Filled 2020-08-18: qty 15

## 2020-08-18 MED ORDER — CEFAZOLIN SODIUM-DEXTROSE 2-4 GM/100ML-% IV SOLN
INTRAVENOUS | Status: AC
  Filled 2020-08-18: qty 100

## 2020-08-18 MED ORDER — LACTATED RINGERS IV SOLN: INTRAVENOUS | Status: DC | PRN

## 2020-08-18 MED ORDER — BUPIVACAINE HCL 0.25 % IJ SOLN
INTRAMUSCULAR | Status: DC | PRN
  Administered 2020-08-18: 5 mL

## 2020-08-18 MED ORDER — PHENYLEPHRINE 40 MCG/ML (10ML) SYRINGE FOR IV PUSH (FOR BLOOD PRESSURE SUPPORT)
PREFILLED_SYRINGE | INTRAVENOUS | Status: AC
  Filled 2020-08-18: qty 10

## 2020-08-18 MED ORDER — FENTANYL CITRATE (PF) 100 MCG/2ML IJ SOLN
INTRAMUSCULAR | Status: DC | PRN
  Administered 2020-08-18 (×3): 50 ug via INTRAVENOUS

## 2020-08-18 MED ORDER — TRAMADOL HCL 50 MG PO TABS: 50.0000 mg | ORAL_TABLET | Freq: Four times a day (QID) | ORAL | 0 refills | Status: AC | PRN

## 2020-08-18 MED ORDER — EPHEDRINE 5 MG/ML INJ
INTRAVENOUS | Status: AC
  Filled 2020-08-18: qty 10

## 2020-08-18 MED ORDER — ONDANSETRON HCL 4 MG/2ML IJ SOLN: 4.0000 mg | Freq: Once | INTRAMUSCULAR | Status: DC | PRN

## 2020-08-18 MED ORDER — DEXAMETHASONE SODIUM PHOSPHATE 10 MG/ML IJ SOLN
INTRAMUSCULAR | Status: DC | PRN
  Administered 2020-08-18: 10 mg via INTRAVENOUS

## 2020-08-18 MED ORDER — EPHEDRINE SULFATE 50 MG/ML IJ SOLN
INTRAMUSCULAR | Status: DC | PRN
  Administered 2020-08-18: 5 mg via INTRAVENOUS

## 2020-08-18 MED ORDER — LACTATED RINGERS IV SOLN: INTRAVENOUS | Status: DC

## 2020-08-18 MED ORDER — ROCURONIUM BROMIDE 10 MG/ML (PF) SYRINGE
PREFILLED_SYRINGE | INTRAVENOUS | Status: AC
  Filled 2020-08-18: qty 30

## 2020-08-18 MED ORDER — APREPITANT 40 MG PO CAPS
ORAL_CAPSULE | ORAL | Status: AC
  Administered 2020-08-18: 40 mg via ORAL
  Filled 2020-08-18: qty 1

## 2020-08-18 MED ORDER — ROCURONIUM BROMIDE 10 MG/ML (PF) SYRINGE
PREFILLED_SYRINGE | INTRAVENOUS | Status: DC | PRN
  Administered 2020-08-18: 40 mg via INTRAVENOUS

## 2020-08-18 MED ORDER — SUCCINYLCHOLINE CHLORIDE 200 MG/10ML IV SOSY
PREFILLED_SYRINGE | INTRAVENOUS | Status: AC
  Filled 2020-08-18: qty 10

## 2020-08-18 SURGICAL SUPPLY — 51 items
ADH SKN CLS APL DERMABOND .7 (GAUZE/BANDAGES/DRESSINGS) ×1
APL PRP STRL LF DISP 70% ISPRP (MISCELLANEOUS) ×1
APPLIER CLIP LOGIC TI 5 (MISCELLANEOUS) IMPLANT
APR CLP MED LRG 33X5 (MISCELLANEOUS)
BLADE CLIPPER SURG (BLADE) IMPLANT
CANISTER SUCT 3000ML PPV (MISCELLANEOUS) IMPLANT
CHLORAPREP W/TINT 26 (MISCELLANEOUS) ×2 IMPLANT
COVER SURGICAL LIGHT HANDLE (MISCELLANEOUS) ×2 IMPLANT
COVER WAND RF STERILE (DRAPES) ×1 IMPLANT
DERMABOND ADVANCED (GAUZE/BANDAGES/DRESSINGS) ×1
DERMABOND ADVANCED .7 DNX12 (GAUZE/BANDAGES/DRESSINGS) ×1 IMPLANT
DEVICE SECURE STRAP 25 ABSORB (INSTRUMENTS) ×2 IMPLANT
ELECT REM PT RETURN 9FT ADLT (ELECTROSURGICAL) ×2
ELECTRODE REM PT RTRN 9FT ADLT (ELECTROSURGICAL) ×1 IMPLANT
GLOVE BIO SURGEON STRL SZ7.5 (GLOVE) ×2 IMPLANT
GOWN STRL REUS W/ TWL LRG LVL3 (GOWN DISPOSABLE) ×2 IMPLANT
GOWN STRL REUS W/ TWL XL LVL3 (GOWN DISPOSABLE) ×1 IMPLANT
GOWN STRL REUS W/TWL LRG LVL3 (GOWN DISPOSABLE) ×4
GOWN STRL REUS W/TWL XL LVL3 (GOWN DISPOSABLE) ×2
GRASPER SUT TROCAR 14GX15 (MISCELLANEOUS) ×2 IMPLANT
KIT BASIN OR (CUSTOM PROCEDURE TRAY) ×2 IMPLANT
KIT TURNOVER KIT B (KITS) ×2 IMPLANT
MARKER SKIN DUAL TIP RULER LAB (MISCELLANEOUS) ×2 IMPLANT
MESH VENTRALIGHT ST 4.5IN (Mesh General) ×1 IMPLANT
NDL INSUFFLATION 14GA 120MM (NEEDLE) ×1 IMPLANT
NDL SPNL 22GX3.5 QUINCKE BK (NEEDLE) IMPLANT
NEEDLE INSUFFLATION 14GA 120MM (NEEDLE) ×2 IMPLANT
NEEDLE SPNL 22GX3.5 QUINCKE BK (NEEDLE) IMPLANT
NS IRRIG 1000ML POUR BTL (IV SOLUTION) ×2 IMPLANT
PAD ARMBOARD 7.5X6 YLW CONV (MISCELLANEOUS) ×4 IMPLANT
POUCH LAPAROSCOPIC INSTRUMENT (MISCELLANEOUS) IMPLANT
SCISSORS LAP 5X35 DISP (ENDOMECHANICALS) ×2 IMPLANT
SET IRRIG TUBING LAPAROSCOPIC (IRRIGATION / IRRIGATOR) IMPLANT
SET TUBE SMOKE EVAC HIGH FLOW (TUBING) ×2 IMPLANT
SHEARS HARMONIC ACE PLUS 36CM (ENDOMECHANICALS) ×1 IMPLANT
SLEEVE ENDOPATH XCEL 5M (ENDOMECHANICALS) ×2 IMPLANT
SUT CHROMIC 2 0 SH (SUTURE) ×2 IMPLANT
SUT ETHIBOND CT1 BRD #0 30IN (SUTURE) ×3 IMPLANT
SUT MNCRL AB 3-0 PS2 18 (SUTURE) ×2 IMPLANT
SUT NOVA NAB DX-16 0-1 5-0 T12 (SUTURE) IMPLANT
SUT PROLENE 2 0 KS (SUTURE) IMPLANT
SUT SILK 3 0 SH 30 (SUTURE) IMPLANT
TOWEL GREEN STERILE (TOWEL DISPOSABLE) ×2 IMPLANT
TOWEL GREEN STERILE FF (TOWEL DISPOSABLE) ×2 IMPLANT
TRAY FOLEY W/BAG SLVR 16FR (SET/KITS/TRAYS/PACK)
TRAY FOLEY W/BAG SLVR 16FR ST (SET/KITS/TRAYS/PACK) IMPLANT
TRAY LAPAROSCOPIC MC (CUSTOM PROCEDURE TRAY) ×2 IMPLANT
TROCAR XCEL NON-BLD 11X100MML (ENDOMECHANICALS) IMPLANT
TROCAR XCEL NON-BLD 5MMX100MML (ENDOMECHANICALS) ×2 IMPLANT
WARMER LAPAROSCOPE (MISCELLANEOUS) ×2 IMPLANT
WATER STERILE IRR 1000ML POUR (IV SOLUTION) ×2 IMPLANT

## 2020-08-18 NOTE — Anesthesia Procedure Notes (Signed)
Procedure Name: Intubation Date/Time: 08/18/2020 10:59 AM Performed by: Jennelle Human, CRNA Pre-anesthesia Checklist: Patient identified, Emergency Drugs available, Suction available and Patient being monitored Patient Re-evaluated:Patient Re-evaluated prior to induction Oxygen Delivery Method: Circle system utilized Preoxygenation: Pre-oxygenation with 100% oxygen Induction Type: IV induction Ventilation: Mask ventilation without difficulty and Oral airway inserted - appropriate to patient size Laryngoscope Size: Hyacinth Meeker and 2 Grade View: Grade I Tube type: Oral Tube size: 7.0 mm Number of attempts: 1 Airway Equipment and Method: Stylet and Oral airway Placement Confirmation: ETT inserted through vocal cords under direct vision,  positive ETCO2 and breath sounds checked- equal and bilateral Secured at: 21 cm Tube secured with: Tape Dental Injury: Teeth and Oropharynx as per pre-operative assessment

## 2020-08-18 NOTE — Anesthesia Postprocedure Evaluation (Signed)
Anesthesia Post Note  Patient: Kaitlyn Lowe  Procedure(s) Performed: LAPAROSCOPIC INCISIONAL HERNIA REPAIR WITH MESH (N/A Abdomen)     Patient location during evaluation: PACU Anesthesia Type: General Level of consciousness: awake and alert Pain management: pain level controlled Vital Signs Assessment: post-procedure vital signs reviewed and stable Respiratory status: spontaneous breathing, nonlabored ventilation, respiratory function stable and patient connected to nasal cannula oxygen Cardiovascular status: blood pressure returned to baseline and stable Postop Assessment: no apparent nausea or vomiting Anesthetic complications: no   No complications documented.  Last Vitals:  Vitals:   08/18/20 1250 08/18/20 1305  BP: (!) 155/70 (!) 154/72  Pulse: 60 61  Resp: 11 14  Temp:    SpO2: 98% 100%    Last Pain:  Vitals:   08/18/20 1250  TempSrc:   PainSc: Asleep                 [name]

## 2020-08-18 NOTE — Transfer of Care (Signed)
Immediate Anesthesia Transfer of Care Note  Patient: Kaitlyn Lowe  Procedure(s) Performed: LAPAROSCOPIC INCISIONAL HERNIA REPAIR WITH MESH (N/A Abdomen)  Patient Location: PACU  Anesthesia Type:General  Level of Consciousness: drowsy and patient cooperative  Airway & Oxygen Therapy: Patient Spontanous Breathing  Post-op Assessment: Report given to RN, Post -op Vital signs reviewed and stable and Patient moving all extremities X 4  Post vital signs: Reviewed and stable  Last Vitals:  Vitals Value Taken Time  BP 180/86 08/18/20 1148  Temp    Pulse 74 08/18/20 1149  Resp 20 08/18/20 1149  SpO2 100 % 08/18/20 1149  Vitals shown include unvalidated device data.  Last Pain:  Vitals:   08/18/20 0904  TempSrc: Oral  PainSc:       Patients Stated Pain Goal: 6 (08/18/20 0850)  Complications: No complications documented.

## 2020-08-18 NOTE — Interval H&P Note (Signed)
History and Physical Interval Note:  08/18/2020 8:54 AM  Kaitlyn Lowe  has presented today for surgery, with the diagnosis of INCISIONAL HERNIA.  The various methods of treatment have been discussed with the patient and family. After consideration of risks, benefits and other options for treatment, the patient has consented to  Procedure(s): LAPAROSCOPIC INCISIONAL HERNIA REPAIR WITH MESH (N/A) as a surgical intervention.  The patient's history has been reviewed, patient examined, no change in status, stable for surgery.  I have reviewed the patient's chart and labs.  Questions were answered to the patient's satisfaction.     Axel Filler

## 2020-08-18 NOTE — Op Note (Signed)
08/18/2020  11:32 AM  PATIENT:  Kaitlyn Lowe  70 y.o. female  PRE-OPERATIVE DIAGNOSIS:  INCISIONAL HERNIA  POST-OPERATIVE DIAGNOSIS:  INCISIONAL HERNIA  PROCEDURE:  Procedure(s): LAPAROSCOPIC INCISIONAL HERNIA REPAIR WITH MESH (N/A)  SURGEON:  Surgeon(s) and Role:    * Axel Filler, MD - Primary   ASSISTANTS: Dr. Derrick Ravel, PGY-4   ANESTHESIA:   local and general  EBL:  minimal   BLOOD ADMINISTERED:none  DRAINS: none   LOCAL MEDICATIONS USED:  BUPIVICAINE   SPECIMEN:  No Specimen  DISPOSITION OF SPECIMEN:  N/A  COUNTS:  YES  TOURNIQUET:  * No tourniquets in log *  DICTATION: .Dragon Dictation   Details of the procedure:   After the patient was consented patient was taken back to the operating room patient was then placed in supine position bilateral SCDs in place.  The patient was prepped and draped in the usual sterile fashion. After antibiotics were confirmed a timeout was called and all facts were verified. The Veress needle technique was used to insuflate the abdomen at Palmer's point. The abdomen was insufflated to 14 mm mercury. Subsequently a 5 mm trocar was placed a camera inserted there was no injury to any intra-abdominal organs.    There was seen to be a non-incarcerated  2cm hernia at the falciform.  A second camera port was in placed into the left lower quadrant.   At this the Falicform ligament was taken down with Bovie cautery maintaining hemostasis.  I proceeded to reduce the hernia contents.  The hernia sac was dissected out of the hernia and disposed.  The fascia at the hernia was reapproximated using a 0 ethibonds x3.  Once the hernia was cleared away, a Bard Ventralight 11.4cm  mesh was inserted into the abdomen.  The mesh was secured circumferentially with am Securestrap tacker in a double crown fashion.   The omentum was brought over the area of the mesh. The pneumoperitoneum was evacuated  & all trocars  were removed. The skin was  reapproximated with 4-0  Monocryl sutures in a subcuticular fashion. The skin was dressed with Dermabond.  The patient was taken to the recovery room in stable condition.  Type of repair -primary suture & mesh  Mesh overlap - 5cm  Placement of mesh -  beneath fascia and into peritoneal cavity   PLAN OF CARE: Discharge to home after PACU  PATIENT DISPOSITION:  PACU - hemodynamically stable.   Delay start of Pharmacological VTE agent (>24hrs) due to surgical blood loss or risk of bleeding: not applicable

## 2020-08-18 NOTE — Discharge Instructions (Addendum)
[facility] _______[facility] ° °HERNIA REPAIR: POST OP INSTRUCTIONS ° °Always review your discharge instruction sheet given to you by the facility where your surgery was performed. °IF YOU HAVE DISABILITY OR FAMILY LEAVE FORMS, YOU MUST BRING THEM TO THE OFFICE FOR PROCESSING.   °DO NOT GIVE THEM TO YOUR DOCTOR. ° °1. A  prescription for pain medication may be given to you upon discharge.  Take your pain medication as prescribed, if needed.  If narcotic pain medicine is not needed, then you may take acetaminophen (Tylenol) or ibuprofen (Advil) as needed. °2. Take your usually prescribed medications unless otherwise directed. °If you need a refill on your pain medication, please contact your pharmacy.  They will contact our office to request authorization. Prescriptions will not be filled after 5 pm or on week-ends. °3. You should follow a light diet the first 24 hours after arrival home, such as soup and crackers, etc.  Be sure to include lots of fluids daily.  Resume your normal diet the day after surgery. °4.Most patients will experience some swelling and bruising around the umbilicus or in the groin and scrotum.  Ice packs and reclining will help.  Swelling and bruising can take several days to resolve.  °6. It is common to experience some constipation if taking pain medication after surgery.  Increasing fluid intake and taking a stool softener (such as Colace) will usually help or prevent this problem from occurring.  A mild laxative (Milk of Magnesia or Miralax) should be taken according to package directions if there are no bowel movements after 48 hours. °7. Unless discharge instructions indicate otherwise, you may remove your bandages 24-48 hours after surgery, and you may shower at that time.  You may have steri-strips (small skin tapes) in place directly over the incision.  These strips should be left on the skin for 7-10 days.  If your surgeon used skin glue on the incision, you may shower in  24 hours.  The glue will flake off over the next 2-3 weeks.  Any sutures or staples will be removed at the office during your follow-up visit. °8. ACTIVITIES:  You may resume regular (light) daily activities beginning the next day--such as daily self-care, walking, climbing stairs--gradually increasing activities as tolerated.  You may have sexual intercourse when it is comfortable.  Refrain from any heavy lifting or straining until approved by your doctor. ° °a.You may drive when you are no longer taking prescription pain medication, you can comfortably wear a seatbelt, and you can safely maneuver your car and apply brakes. °b.RETURN TO WORK:   °_____________________________________________ ° °9.You should see your doctor in the office for a follow-up appointment approximately 2-3 weeks after your surgery.  Make sure that you call for this appointment within a day or two after you arrive home to insure a convenient appointment time. °10.OTHER INSTRUCTIONS: _________________________ °   _____________________________________ ° °WHEN TO CALL YOUR DOCTOR: °1. Fever over 101.0 °2. Inability to urinate °3. Nausea and/or vomiting °4. Extreme swelling or bruising °5. Continued bleeding from incision. °6. Increased pain, redness, or drainage from the incision ° °The clinic staff is available to answer your questions during regular business hours.  Please don’t hesitate to call and ask to speak to one of the nurses for clinical concerns.  If you have a medical emergency, go to the nearest emergency room or call 911.  A surgeon from [facility] is always on call at the hospital ° ° °[address], [address], [place], [place]  [address] ? ° [address] [address], , [place]   [address] °[phone] ? [phone] ? FAX [phone] °Web site: [email or web address] ° °

## 2020-08-19 ENCOUNTER — Encounter (HOSPITAL_COMMUNITY): Payer: Self-pay | Admitting: General Surgery

## 2020-08-26 DIAGNOSIS — R791 Abnormal coagulation profile: Secondary | ICD-10-CM | POA: Diagnosis not present

## 2020-08-26 DIAGNOSIS — Z01812 Encounter for preprocedural laboratory examination: Secondary | ICD-10-CM | POA: Diagnosis not present

## 2020-08-26 DIAGNOSIS — M1711 Unilateral primary osteoarthritis, right knee: Secondary | ICD-10-CM | POA: Diagnosis not present

## 2020-08-26 DIAGNOSIS — M25561 Pain in right knee: Secondary | ICD-10-CM | POA: Diagnosis not present

## 2020-08-28 ENCOUNTER — Other Ambulatory Visit: Payer: Self-pay | Admitting: Psychiatry

## 2020-08-28 DIAGNOSIS — F5105 Insomnia due to other mental disorder: Secondary | ICD-10-CM

## 2020-09-01 ENCOUNTER — Other Ambulatory Visit: Payer: Self-pay | Admitting: Psychiatry

## 2020-09-01 DIAGNOSIS — L299 Pruritus, unspecified: Secondary | ICD-10-CM

## 2020-09-03 DIAGNOSIS — M6281 Muscle weakness (generalized): Secondary | ICD-10-CM | POA: Diagnosis not present

## 2020-09-03 DIAGNOSIS — M1711 Unilateral primary osteoarthritis, right knee: Secondary | ICD-10-CM | POA: Diagnosis not present

## 2020-09-03 DIAGNOSIS — M25661 Stiffness of right knee, not elsewhere classified: Secondary | ICD-10-CM | POA: Diagnosis not present

## 2020-09-03 DIAGNOSIS — R262 Difficulty in walking, not elsewhere classified: Secondary | ICD-10-CM | POA: Diagnosis not present

## 2020-09-10 DIAGNOSIS — G8918 Other acute postprocedural pain: Secondary | ICD-10-CM | POA: Diagnosis not present

## 2020-09-10 DIAGNOSIS — M1711 Unilateral primary osteoarthritis, right knee: Secondary | ICD-10-CM | POA: Diagnosis not present

## 2020-09-10 DIAGNOSIS — Z96651 Presence of right artificial knee joint: Secondary | ICD-10-CM | POA: Diagnosis not present

## 2020-09-11 DIAGNOSIS — M1711 Unilateral primary osteoarthritis, right knee: Secondary | ICD-10-CM | POA: Diagnosis not present

## 2020-09-12 DIAGNOSIS — M6281 Muscle weakness (generalized): Secondary | ICD-10-CM | POA: Diagnosis not present

## 2020-09-12 DIAGNOSIS — M1711 Unilateral primary osteoarthritis, right knee: Secondary | ICD-10-CM | POA: Diagnosis not present

## 2020-09-12 DIAGNOSIS — M25661 Stiffness of right knee, not elsewhere classified: Secondary | ICD-10-CM | POA: Diagnosis not present

## 2020-09-12 DIAGNOSIS — R262 Difficulty in walking, not elsewhere classified: Secondary | ICD-10-CM | POA: Diagnosis not present

## 2020-09-16 DIAGNOSIS — M1711 Unilateral primary osteoarthritis, right knee: Secondary | ICD-10-CM | POA: Diagnosis not present

## 2020-09-17 ENCOUNTER — Other Ambulatory Visit (HOSPITAL_COMMUNITY): Payer: Self-pay | Admitting: Internal Medicine

## 2020-09-17 ENCOUNTER — Other Ambulatory Visit: Payer: Self-pay

## 2020-09-17 ENCOUNTER — Ambulatory Visit (HOSPITAL_COMMUNITY)
Admission: RE | Admit: 2020-09-17 | Discharge: 2020-09-17 | Disposition: A | Discharge status: Home / Self Care | Payer: Medicare PPO | Source: Ambulatory Visit | Attending: Cardiovascular Disease | Admitting: Cardiovascular Disease

## 2020-09-17 DIAGNOSIS — M79604 Pain in right leg: Secondary | ICD-10-CM | POA: Diagnosis not present

## 2020-09-18 DIAGNOSIS — M1711 Unilateral primary osteoarthritis, right knee: Secondary | ICD-10-CM | POA: Diagnosis not present

## 2020-09-19 DIAGNOSIS — M6281 Muscle weakness (generalized): Secondary | ICD-10-CM | POA: Diagnosis not present

## 2020-09-19 DIAGNOSIS — M1711 Unilateral primary osteoarthritis, right knee: Secondary | ICD-10-CM | POA: Diagnosis not present

## 2020-09-19 DIAGNOSIS — M25661 Stiffness of right knee, not elsewhere classified: Secondary | ICD-10-CM | POA: Diagnosis not present

## 2020-09-19 DIAGNOSIS — R262 Difficulty in walking, not elsewhere classified: Secondary | ICD-10-CM | POA: Diagnosis not present

## 2020-09-24 DIAGNOSIS — M1711 Unilateral primary osteoarthritis, right knee: Secondary | ICD-10-CM | POA: Diagnosis not present

## 2020-09-24 DIAGNOSIS — M25661 Stiffness of right knee, not elsewhere classified: Secondary | ICD-10-CM | POA: Diagnosis not present

## 2020-09-24 DIAGNOSIS — R262 Difficulty in walking, not elsewhere classified: Secondary | ICD-10-CM | POA: Diagnosis not present

## 2020-09-24 DIAGNOSIS — M6281 Muscle weakness (generalized): Secondary | ICD-10-CM | POA: Diagnosis not present

## 2020-09-26 DIAGNOSIS — M1711 Unilateral primary osteoarthritis, right knee: Secondary | ICD-10-CM | POA: Diagnosis not present

## 2020-09-26 DIAGNOSIS — M6281 Muscle weakness (generalized): Secondary | ICD-10-CM | POA: Diagnosis not present

## 2020-09-26 DIAGNOSIS — R262 Difficulty in walking, not elsewhere classified: Secondary | ICD-10-CM | POA: Diagnosis not present

## 2020-09-26 DIAGNOSIS — M25661 Stiffness of right knee, not elsewhere classified: Secondary | ICD-10-CM | POA: Diagnosis not present

## 2020-09-30 DIAGNOSIS — R262 Difficulty in walking, not elsewhere classified: Secondary | ICD-10-CM | POA: Diagnosis not present

## 2020-09-30 DIAGNOSIS — M25661 Stiffness of right knee, not elsewhere classified: Secondary | ICD-10-CM | POA: Diagnosis not present

## 2020-09-30 DIAGNOSIS — M1711 Unilateral primary osteoarthritis, right knee: Secondary | ICD-10-CM | POA: Diagnosis not present

## 2020-09-30 DIAGNOSIS — M6281 Muscle weakness (generalized): Secondary | ICD-10-CM | POA: Diagnosis not present

## 2020-10-02 DIAGNOSIS — M25661 Stiffness of right knee, not elsewhere classified: Secondary | ICD-10-CM | POA: Diagnosis not present

## 2020-10-02 DIAGNOSIS — R262 Difficulty in walking, not elsewhere classified: Secondary | ICD-10-CM | POA: Diagnosis not present

## 2020-10-02 DIAGNOSIS — M6281 Muscle weakness (generalized): Secondary | ICD-10-CM | POA: Diagnosis not present

## 2020-10-02 DIAGNOSIS — M1711 Unilateral primary osteoarthritis, right knee: Secondary | ICD-10-CM | POA: Diagnosis not present

## 2020-10-06 DIAGNOSIS — M1711 Unilateral primary osteoarthritis, right knee: Secondary | ICD-10-CM | POA: Diagnosis not present

## 2020-10-06 DIAGNOSIS — M25661 Stiffness of right knee, not elsewhere classified: Secondary | ICD-10-CM | POA: Diagnosis not present

## 2020-10-06 DIAGNOSIS — R262 Difficulty in walking, not elsewhere classified: Secondary | ICD-10-CM | POA: Diagnosis not present

## 2020-10-06 DIAGNOSIS — M6281 Muscle weakness (generalized): Secondary | ICD-10-CM | POA: Diagnosis not present

## 2020-10-08 DIAGNOSIS — M6281 Muscle weakness (generalized): Secondary | ICD-10-CM | POA: Diagnosis not present

## 2020-10-08 DIAGNOSIS — M1711 Unilateral primary osteoarthritis, right knee: Secondary | ICD-10-CM | POA: Diagnosis not present

## 2020-10-08 DIAGNOSIS — M25661 Stiffness of right knee, not elsewhere classified: Secondary | ICD-10-CM | POA: Diagnosis not present

## 2020-10-08 DIAGNOSIS — R262 Difficulty in walking, not elsewhere classified: Secondary | ICD-10-CM | POA: Diagnosis not present

## 2020-10-15 DIAGNOSIS — R262 Difficulty in walking, not elsewhere classified: Secondary | ICD-10-CM | POA: Diagnosis not present

## 2020-10-15 DIAGNOSIS — M1711 Unilateral primary osteoarthritis, right knee: Secondary | ICD-10-CM | POA: Diagnosis not present

## 2020-10-15 DIAGNOSIS — M6281 Muscle weakness (generalized): Secondary | ICD-10-CM | POA: Diagnosis not present

## 2020-10-15 DIAGNOSIS — M25661 Stiffness of right knee, not elsewhere classified: Secondary | ICD-10-CM | POA: Diagnosis not present

## 2020-10-20 DIAGNOSIS — M1711 Unilateral primary osteoarthritis, right knee: Secondary | ICD-10-CM | POA: Diagnosis not present

## 2020-10-23 DIAGNOSIS — M1711 Unilateral primary osteoarthritis, right knee: Secondary | ICD-10-CM | POA: Diagnosis not present

## 2020-10-27 DIAGNOSIS — M25661 Stiffness of right knee, not elsewhere classified: Secondary | ICD-10-CM | POA: Diagnosis not present

## 2020-10-27 DIAGNOSIS — R262 Difficulty in walking, not elsewhere classified: Secondary | ICD-10-CM | POA: Diagnosis not present

## 2020-10-27 DIAGNOSIS — M6281 Muscle weakness (generalized): Secondary | ICD-10-CM | POA: Diagnosis not present

## 2020-10-27 DIAGNOSIS — M1711 Unilateral primary osteoarthritis, right knee: Secondary | ICD-10-CM | POA: Diagnosis not present

## 2020-11-03 DIAGNOSIS — M25661 Stiffness of right knee, not elsewhere classified: Secondary | ICD-10-CM | POA: Diagnosis not present

## 2020-11-03 DIAGNOSIS — R262 Difficulty in walking, not elsewhere classified: Secondary | ICD-10-CM | POA: Diagnosis not present

## 2020-11-03 DIAGNOSIS — M1711 Unilateral primary osteoarthritis, right knee: Secondary | ICD-10-CM | POA: Diagnosis not present

## 2020-11-03 DIAGNOSIS — M6281 Muscle weakness (generalized): Secondary | ICD-10-CM | POA: Diagnosis not present

## 2020-11-06 DIAGNOSIS — M25661 Stiffness of right knee, not elsewhere classified: Secondary | ICD-10-CM | POA: Diagnosis not present

## 2020-11-06 DIAGNOSIS — M6281 Muscle weakness (generalized): Secondary | ICD-10-CM | POA: Diagnosis not present

## 2020-11-06 DIAGNOSIS — R262 Difficulty in walking, not elsewhere classified: Secondary | ICD-10-CM | POA: Diagnosis not present

## 2020-11-06 DIAGNOSIS — M1711 Unilateral primary osteoarthritis, right knee: Secondary | ICD-10-CM | POA: Diagnosis not present

## 2020-11-10 DIAGNOSIS — M25661 Stiffness of right knee, not elsewhere classified: Secondary | ICD-10-CM | POA: Diagnosis not present

## 2020-11-10 DIAGNOSIS — M1711 Unilateral primary osteoarthritis, right knee: Secondary | ICD-10-CM | POA: Diagnosis not present

## 2020-11-10 DIAGNOSIS — R262 Difficulty in walking, not elsewhere classified: Secondary | ICD-10-CM | POA: Diagnosis not present

## 2020-11-10 DIAGNOSIS — M6281 Muscle weakness (generalized): Secondary | ICD-10-CM | POA: Diagnosis not present

## 2020-11-13 DIAGNOSIS — M25661 Stiffness of right knee, not elsewhere classified: Secondary | ICD-10-CM | POA: Diagnosis not present

## 2020-11-13 DIAGNOSIS — R262 Difficulty in walking, not elsewhere classified: Secondary | ICD-10-CM | POA: Diagnosis not present

## 2020-11-13 DIAGNOSIS — M1711 Unilateral primary osteoarthritis, right knee: Secondary | ICD-10-CM | POA: Diagnosis not present

## 2020-11-13 DIAGNOSIS — M6281 Muscle weakness (generalized): Secondary | ICD-10-CM | POA: Diagnosis not present

## 2020-11-18 DIAGNOSIS — M25661 Stiffness of right knee, not elsewhere classified: Secondary | ICD-10-CM | POA: Diagnosis not present

## 2020-11-18 DIAGNOSIS — M6281 Muscle weakness (generalized): Secondary | ICD-10-CM | POA: Diagnosis not present

## 2020-11-18 DIAGNOSIS — R262 Difficulty in walking, not elsewhere classified: Secondary | ICD-10-CM | POA: Diagnosis not present

## 2020-11-18 DIAGNOSIS — M1711 Unilateral primary osteoarthritis, right knee: Secondary | ICD-10-CM | POA: Diagnosis not present

## 2020-11-20 DIAGNOSIS — R262 Difficulty in walking, not elsewhere classified: Secondary | ICD-10-CM | POA: Diagnosis not present

## 2020-11-20 DIAGNOSIS — M6281 Muscle weakness (generalized): Secondary | ICD-10-CM | POA: Diagnosis not present

## 2020-11-20 DIAGNOSIS — M25661 Stiffness of right knee, not elsewhere classified: Secondary | ICD-10-CM | POA: Diagnosis not present

## 2020-11-20 DIAGNOSIS — M1711 Unilateral primary osteoarthritis, right knee: Secondary | ICD-10-CM | POA: Diagnosis not present

## 2020-11-26 DIAGNOSIS — M25661 Stiffness of right knee, not elsewhere classified: Secondary | ICD-10-CM | POA: Diagnosis not present

## 2020-11-26 DIAGNOSIS — R262 Difficulty in walking, not elsewhere classified: Secondary | ICD-10-CM | POA: Diagnosis not present

## 2020-11-26 DIAGNOSIS — M1711 Unilateral primary osteoarthritis, right knee: Secondary | ICD-10-CM | POA: Diagnosis not present

## 2020-11-26 DIAGNOSIS — M6281 Muscle weakness (generalized): Secondary | ICD-10-CM | POA: Diagnosis not present

## 2020-12-02 DIAGNOSIS — M6281 Muscle weakness (generalized): Secondary | ICD-10-CM | POA: Diagnosis not present

## 2020-12-02 DIAGNOSIS — M25661 Stiffness of right knee, not elsewhere classified: Secondary | ICD-10-CM | POA: Diagnosis not present

## 2020-12-02 DIAGNOSIS — M1711 Unilateral primary osteoarthritis, right knee: Secondary | ICD-10-CM | POA: Diagnosis not present

## 2020-12-02 DIAGNOSIS — R262 Difficulty in walking, not elsewhere classified: Secondary | ICD-10-CM | POA: Diagnosis not present

## 2020-12-09 DIAGNOSIS — M25552 Pain in left hip: Secondary | ICD-10-CM | POA: Diagnosis not present

## 2020-12-09 DIAGNOSIS — M25551 Pain in right hip: Secondary | ICD-10-CM | POA: Diagnosis not present

## 2020-12-18 ENCOUNTER — Other Ambulatory Visit: Payer: Self-pay | Admitting: Psychiatry

## 2020-12-18 DIAGNOSIS — L299 Pruritus, unspecified: Secondary | ICD-10-CM

## 2021-01-15 DIAGNOSIS — Z23 Encounter for immunization: Secondary | ICD-10-CM | POA: Diagnosis not present

## 2021-02-10 DIAGNOSIS — M1711 Unilateral primary osteoarthritis, right knee: Secondary | ICD-10-CM | POA: Diagnosis not present

## 2021-02-11 DIAGNOSIS — Z1231 Encounter for screening mammogram for malignant neoplasm of breast: Secondary | ICD-10-CM | POA: Diagnosis not present

## 2021-02-18 DIAGNOSIS — R922 Inconclusive mammogram: Secondary | ICD-10-CM | POA: Diagnosis not present

## 2021-02-18 DIAGNOSIS — R928 Other abnormal and inconclusive findings on diagnostic imaging of breast: Secondary | ICD-10-CM | POA: Diagnosis not present

## 2021-02-19 ENCOUNTER — Other Ambulatory Visit: Payer: Self-pay | Admitting: Psychiatry

## 2021-02-19 DIAGNOSIS — F5105 Insomnia due to other mental disorder: Secondary | ICD-10-CM

## 2021-02-25 ENCOUNTER — Other Ambulatory Visit: Payer: Self-pay | Admitting: Psychiatry

## 2021-02-25 DIAGNOSIS — L299 Pruritus, unspecified: Secondary | ICD-10-CM

## 2021-02-25 NOTE — Telephone Encounter (Signed)
We received a refill request for patient's Vistaril. Per your 2/22 note she was to take Vistaril for itching and to take 2 QHS in place of trazodone to help with sleep. I called patient to see what she is taking and she is taking both the 2 tabs of Vistaril at QHS and also the trazodone. She wants to continue both medications.  She has not had an appt since 2/22. Please clarify.   Try Vistaril 25-50 mg HS for sleep and itching in place of trazodone

## 2021-02-27 NOTE — Telephone Encounter (Signed)
Pt had visit 05/2020 and is not due for a follow up until 05/2021  Will submit her refill request

## 2021-02-27 NOTE — Telephone Encounter (Signed)
Will contact pt to discuss

## 2021-03-03 ENCOUNTER — Other Ambulatory Visit: Payer: Self-pay | Admitting: Radiology

## 2021-03-03 DIAGNOSIS — N641 Fat necrosis of breast: Secondary | ICD-10-CM | POA: Diagnosis not present

## 2021-03-03 DIAGNOSIS — N6012 Diffuse cystic mastopathy of left breast: Secondary | ICD-10-CM | POA: Diagnosis not present

## 2021-03-03 DIAGNOSIS — N6322 Unspecified lump in the left breast, upper inner quadrant: Secondary | ICD-10-CM | POA: Diagnosis not present

## 2021-05-03 ENCOUNTER — Other Ambulatory Visit: Payer: Self-pay | Admitting: Psychiatry

## 2021-05-03 DIAGNOSIS — L299 Pruritus, unspecified: Secondary | ICD-10-CM

## 2021-05-13 DIAGNOSIS — Z79899 Other long term (current) drug therapy: Secondary | ICD-10-CM | POA: Diagnosis not present

## 2021-05-13 DIAGNOSIS — R0982 Postnasal drip: Secondary | ICD-10-CM | POA: Diagnosis not present

## 2021-05-13 DIAGNOSIS — F3181 Bipolar II disorder: Secondary | ICD-10-CM | POA: Diagnosis not present

## 2021-05-13 DIAGNOSIS — K760 Fatty (change of) liver, not elsewhere classified: Secondary | ICD-10-CM | POA: Diagnosis not present

## 2021-05-13 DIAGNOSIS — E782 Mixed hyperlipidemia: Secondary | ICD-10-CM | POA: Diagnosis not present

## 2021-05-20 ENCOUNTER — Other Ambulatory Visit: Payer: Self-pay | Admitting: Psychiatry

## 2021-05-20 DIAGNOSIS — F5105 Insomnia due to other mental disorder: Secondary | ICD-10-CM

## 2021-06-26 DIAGNOSIS — H5203 Hypermetropia, bilateral: Secondary | ICD-10-CM | POA: Diagnosis not present

## 2021-06-26 DIAGNOSIS — H524 Presbyopia: Secondary | ICD-10-CM | POA: Diagnosis not present

## 2021-06-26 DIAGNOSIS — H52223 Regular astigmatism, bilateral: Secondary | ICD-10-CM | POA: Diagnosis not present

## 2021-07-04 ENCOUNTER — Other Ambulatory Visit: Payer: Self-pay | Admitting: Psychiatry

## 2021-07-04 DIAGNOSIS — F319 Bipolar disorder, unspecified: Secondary | ICD-10-CM

## 2021-07-06 NOTE — Telephone Encounter (Signed)
Please schedule apt. Last visit 05/2020 ?

## 2021-07-07 NOTE — Telephone Encounter (Signed)
Patient called in for refill on Fluoxetine 10mg . Ph: [phone]. Appt 5/15. Pharmacy [facility] [address] [place] ?

## 2021-07-07 NOTE — Telephone Encounter (Signed)
LVM for pt to call and schedule  °

## 2021-07-28 DIAGNOSIS — M19072 Primary osteoarthritis, left ankle and foot: Secondary | ICD-10-CM | POA: Diagnosis not present

## 2021-08-09 ENCOUNTER — Other Ambulatory Visit: Payer: Self-pay | Admitting: Psychiatry

## 2021-08-09 DIAGNOSIS — F319 Bipolar disorder, unspecified: Secondary | ICD-10-CM

## 2021-08-09 DIAGNOSIS — L299 Pruritus, unspecified: Secondary | ICD-10-CM

## 2021-08-18 ENCOUNTER — Other Ambulatory Visit: Payer: Self-pay | Admitting: Psychiatry

## 2021-08-18 DIAGNOSIS — F5105 Insomnia due to other mental disorder: Secondary | ICD-10-CM

## 2021-08-24 ENCOUNTER — Ambulatory Visit (INDEPENDENT_AMBULATORY_CARE_PROVIDER_SITE_OTHER): Payer: Medicare PPO | Admitting: Psychiatry

## 2021-08-24 ENCOUNTER — Other Ambulatory Visit: Payer: Self-pay | Admitting: Psychiatry

## 2021-08-24 ENCOUNTER — Encounter: Payer: Self-pay | Admitting: Psychiatry

## 2021-08-24 DIAGNOSIS — F319 Bipolar disorder, unspecified: Secondary | ICD-10-CM | POA: Diagnosis not present

## 2021-08-24 DIAGNOSIS — G251 Drug-induced tremor: Secondary | ICD-10-CM

## 2021-08-24 DIAGNOSIS — G3184 Mild cognitive impairment, so stated: Secondary | ICD-10-CM | POA: Diagnosis not present

## 2021-08-24 DIAGNOSIS — L299 Pruritus, unspecified: Secondary | ICD-10-CM | POA: Diagnosis not present

## 2021-08-24 DIAGNOSIS — Z79899 Other long term (current) drug therapy: Secondary | ICD-10-CM

## 2021-08-24 DIAGNOSIS — F5105 Insomnia due to other mental disorder: Secondary | ICD-10-CM | POA: Diagnosis not present

## 2021-08-24 MED ORDER — PROPRANOLOL HCL 10 MG PO TABS: ORAL_TABLET | ORAL | 0 refills | Status: DC

## 2021-08-24 MED ORDER — DOXEPIN HCL 25 MG PO CAPS: 25.0000 mg | ORAL_CAPSULE | Freq: Every day | ORAL | 0 refills | Status: DC

## 2021-08-24 NOTE — Patient Instructions (Addendum)
Stop hydroxysine and trazodone ?Start doxepin 25 mg capsule 1 at night for itching and sleep ?Try propranolol for tremor but if 2 tablets don't help then stop it. ?GET LAB TESTS AT [facility] ?

## 2021-08-24 NOTE — Progress Notes (Signed)
Kaitlyn Lowe Fronczak [id number] 01/17/1951 71 y.o.    Subjective:   Patient ID:  Kaitlyn Lowe Kaitlyn Lowe is a 71 y.o. (DOB 03/03/1951) female.  Chief Complaint:  Chief Complaint  Patient presents with   Follow-up    Mood and sleep   Other    itching   Sleeping Problem    HPI Kaitlyn Lowe [name] presents to the office today for follow-up of bipolar disorder.  seen February 08, 2018 with no med changes.  She has had a period of several years of stability.  06/03/20 appt with following noted: Stable mood.  Concerns about memory in conversation.  Not forgetting bills. No SE with meds and no mood swings.   Some trouble getting to sleep.  No caffeine after 5 PM.  2 glasses of tea at lunch.  Sleep interrupted by nocturia 3-4 times and may take an hour or 2 to go to sleep.  Sleep problems for a year.  Not drowsy daytime.  No naps.  Reads until 11 and might get up 930 or 1030. Still itching and that contributes to insomnia also. Still doing well without problems except itching really only at night.  No rash.  No history of allergies.   Patient reports stable mood and denies depressed or irritable moods.  Patient denies any recent difficulty with anxiety.  Denies appetite disturbance.  Patient reports that energy and motivation have been good.  Patient denies any difficulty with concentration.  Patient denies any suicidal ideation. Plan: Continue lithium 600 mg, fluoxetine 10 mg, trazodone 100 mg nightly  08/24/2021 appointment with the following noted: Itching only at night mostly head and some in body. Unrlieved by hydroxyzine 25 mg A little more irritable and easier to anger in flashes with trigger. Tremor in L hand a problem carrying a glass of wine. Friend notices memory not as good.  No problems with leaving pots on stove but will forget purse.  Some longterm memory loss of events she thinks its excessive. TKR September 10 2020 A lot of nights sleep doesn't start until 3 but sleeps until 10. But goes to bed to read  about 1030.    Past Psychiatric Medication Trials: Hydroxyzine NR for itching at 50 mg HS Trazodone 100 works except Stage managerApotex generic.  Review of Systems:  Review of Systems  Skin:  Negative for rash.       Itching at night  Neurological:  Negative for tremors and weakness.   Medications: I have reviewed the patient's current medications.  Current Outpatient Medications  Medication Sig Dispense Refill   azelastine (ASTELIN) 0.1 % nasal spray Place 1 spray into both nostrils daily as needed for rhinitis or allergies. Use in each nostril as directed     benzonatate (TESSALON) 200 MG capsule Take 200 mg by mouth 3 (three) times daily as needed for cough.     cholecalciferol (VITAMIN D3) 25 MCG (1000 UNIT) tablet Take 1,000 Units by mouth daily.     docusate sodium (COLACE) 100 MG capsule Take 200 mg by mouth in the morning.     doxepin (SINEQUAN) 25 MG capsule Take 1 capsule (25 mg total) by mouth at bedtime. 30 capsule 0   FLUoxetine (PROZAC) 10 MG capsule TAKE 1 CAPSULE(10 MG) BY MOUTH DAILY 60 capsule 0   lithium carbonate 300 MG capsule TAKE 2 CAPSULES(600 MG) BY MOUTH AT BEDTIME 60 capsule 0   naproxen sodium (ANAPROX) 220 MG tablet Take 440 mg by mouth daily.     Omeprazole 20 MG TBEC Take  20 mg by mouth daily.     Polyethyl Glycol-Propyl Glycol (SYSTANE OP) Place 1 drop into both eyes in the morning.     propranolol (INDERAL) 10 MG tablet Take 1-2 tabs po BID prn anxiety 100 tablet 0   No current facility-administered medications for this visit.    Medication Side Effects: None  Allergies:  Allergies  Allergen Reactions   Codeine Nausea Only    Past Medical History:  Diagnosis Date   Depression     Family History  Problem Relation Age of Onset   Arthritis Mother    Mental illness Mother    Hypertension Mother     Social History   Socioeconomic History   Marital status: Divorced    Spouse name: Not on file   Number of children: Not on file   Years of  education: Not on file   Highest education level: Not on file  Occupational History   Not on file  Tobacco Use   Smoking status: Never   Smokeless tobacco: Never  Vaping Use   Vaping Use: Never used  Substance and Sexual Activity   Alcohol use: Yes    Comment: three times a week 2 drinks at a time.   Drug use: No   Sexual activity: Not on file  Other Topics Concern   Not on file  Social History Narrative   Not on file   Social Determinants of Health   Financial Resource Strain: Not on file  Food Insecurity: Not on file  Transportation Needs: Not on file  Physical Activity: Not on file  Stress: Not on file  Social Connections: Not on file  Intimate Partner Violence: Not on file    Past Medical History, Surgical history, Social history, and Family history were reviewed and updated as appropriate.   Please see review of systems for further details on the patient's review from today.   Objective:   Physical Exam:  There were no vitals taken for this visit.  Physical Exam Neurological:     Mental Status: She is alert and oriented to person, place, and time.     Cranial Nerves: No dysarthria.  Psychiatric:        Attention and Perception: Attention and perception normal.        Mood and Affect: Mood normal. Mood is not anxious or depressed.        Speech: Speech normal. Speech is not rapid and pressured.        Behavior: Behavior is cooperative.        Thought Content: Thought content normal. Thought content is not paranoid or delusional. Thought content does not include homicidal or suicidal ideation. Thought content does not include homicidal or suicidal plan.        Cognition and Memory: Cognition and memory normal.        Judgment: Judgment normal.     Comments: Insight intact    Lab Review:     Component Value Date/Time   NA 140 08/27/2021 1011   K 4.3 08/27/2021 1011   CL 103 08/27/2021 1011   CO2 29 08/27/2021 1011   GLUCOSE 87 08/27/2021 1011   BUN 14  08/27/2021 1011   CREATININE 0.91 08/27/2021 1011   CALCIUM 10.5 (H) 08/27/2021 1011   PROT 6.9 10/17/2012 1537   ALBUMIN 4.1 10/17/2012 1537   AST 21 10/17/2012 1537   ALT 17 10/17/2012 1537   ALKPHOS 36 (L) 10/17/2012 1537   BILITOT 0.7 10/17/2012 1537   GFRNONAA >  60 08/13/2020 1500   GFRAA >60 01/08/2019 1520       Component Value Date/Time   WBC 7.4 01/08/2019 1520   RBC 4.33 01/08/2019 1520   HGB 13.1 01/08/2019 1520   HCT 41.8 01/08/2019 1520   PLT 269 01/08/2019 1520   MCV 96.5 01/08/2019 1520   MCH 30.3 01/08/2019 1520   MCHC 31.3 01/08/2019 1520   RDW 12.6 01/08/2019 1520   LYMPHSABS 1.0 10/17/2012 1537   MONOABS 0.5 10/17/2012 1537   EOSABS 0.1 10/17/2012 1537   BASOSABS 0.0 10/17/2012 1537    Lithium Lvl  Date Value Ref Range Status  08/27/2021 0.8 0.6 - 1.2 mmol/L Final     No results found for: PHENYTOIN, PHENOBARB, VALPROATE, CBMZ   .res Assessment: Plan:    Yury was seen today for follow-up, other and sleeping problem.  Diagnoses and all orders for this visit:  Bipolar I disorder (HCC) -     Lithium level -     Basic metabolic panel -     TSH  Insomnia due to mental condition -     doxepin (SINEQUAN) 25 MG capsule; Take 1 capsule (25 mg total) by mouth at bedtime.  Lithium use -     Lithium level -     Basic metabolic panel -     TSH  Pruritic condition -     doxepin (SINEQUAN) 25 MG capsule; Take 1 capsule (25 mg total) by mouth at bedtime.  Lithium-induced tremor -     propranolol (INDERAL) 10 MG tablet; Take 1-2 tabs po BID prn anxiety  Mild cognitive impairment -     B12 and Folate Panel  Other orders -     B12 and Folate Panel   Very stable bipolar with good response to LITHIUM and fluoxetine.  Discussed mood cycling risk with fluoxetine or other SSRIs in bipolar patients but she has benefited and been stable.  Sleep problems with trazodone Apotex generic. Disc itching and workup from PCP.  Disc use of antihistamines.    DC  trazodone and hydroxyzine, trial doxepin 25 mg HS  Counseled patient regarding potential benefits, risks, and side effects of lithium to include potential risk of lithium affecting thyroid and renal function.  Discussed need for periodic lab monitoring to determine drug level and to assess for potential adverse effects.  Counseled patient regarding signs and symptoms of lithium toxicity and advised that they notify office immediately or seek urgent medical attention if experiencing these signs and symptoms.  Patient advised to contact office with any questions or concerns.  Option propranolol 10-20 mg BID prn tremor.  Although the tremor characteristics are more consistent with PD.  Disc with PCP  Creatinine was 0.82 in September, calcium 9.9 and overall BMP was unremarkable at that time with a normal CBC.  Lithium level May 12, 2018 was 0.6.  Needs lithium level soon. DT forgetfulness check B12 and folate and TSH Continue lithium 600 HS and fluoxetine 10 Cont meds without change.  Fu 6 MOS  Meredith Staggers, MD, DFAPA'  Please see After Visit Summary for patient specific instructions. Stop hydroxysine and trazodone Start doxepin 25 mg capsule 1 at night for itching and sleep Try propranolol for tremor but if 2 tablets don't help then stop it. GET LAB TESTS AT [facility]  No future appointments.  Orders Placed This Encounter  Procedures   Lithium level   Basic metabolic panel   TSH   B12 and Folate Panel   B12  and Folate Panel    -------------------------------

## 2021-08-27 DIAGNOSIS — Z79899 Other long term (current) drug therapy: Secondary | ICD-10-CM | POA: Diagnosis not present

## 2021-08-27 DIAGNOSIS — F319 Bipolar disorder, unspecified: Secondary | ICD-10-CM | POA: Diagnosis not present

## 2021-08-28 LAB — LITHIUM LEVEL: Lithium Lvl: 0.8 mmol/L (ref 0.6–1.2)

## 2021-08-28 LAB — BASIC METABOLIC PANEL
BUN: 14 mg/dL (ref 7–25)
CO2: 29 mmol/L (ref 20–32)
Calcium: 10.5 mg/dL — ABNORMAL HIGH (ref 8.6–10.4)
Chloride: 103 mmol/L (ref 98–110)
Creat: 0.91 mg/dL (ref 0.60–1.00)
Glucose, Bld: 87 mg/dL (ref 65–99)
Potassium: 4.3 mmol/L (ref 3.5–5.3)
Sodium: 140 mmol/L (ref 135–146)

## 2021-08-28 LAB — TSH: TSH: 3.2 mIU/L (ref 0.40–4.50)

## 2021-08-28 LAB — B12 AND FOLATE PANEL
Folate: 12 ng/mL
Vitamin B-12: 277 pg/mL (ref 200–1100)

## 2021-09-11 ENCOUNTER — Other Ambulatory Visit: Payer: Self-pay | Admitting: Psychiatry

## 2021-09-11 DIAGNOSIS — F319 Bipolar disorder, unspecified: Secondary | ICD-10-CM

## 2021-09-20 ENCOUNTER — Other Ambulatory Visit: Payer: Self-pay | Admitting: Psychiatry

## 2021-09-20 DIAGNOSIS — F5105 Insomnia due to other mental disorder: Secondary | ICD-10-CM

## 2021-09-20 DIAGNOSIS — L299 Pruritus, unspecified: Secondary | ICD-10-CM

## 2021-09-20 DIAGNOSIS — F319 Bipolar disorder, unspecified: Secondary | ICD-10-CM

## 2021-09-21 DIAGNOSIS — Z0000 Encounter for general adult medical examination without abnormal findings: Secondary | ICD-10-CM | POA: Diagnosis not present

## 2021-09-21 DIAGNOSIS — E782 Mixed hyperlipidemia: Secondary | ICD-10-CM | POA: Diagnosis not present

## 2021-09-21 DIAGNOSIS — Z79899 Other long term (current) drug therapy: Secondary | ICD-10-CM | POA: Diagnosis not present

## 2021-09-21 DIAGNOSIS — Z23 Encounter for immunization: Secondary | ICD-10-CM | POA: Diagnosis not present

## 2021-09-21 DIAGNOSIS — R251 Tremor, unspecified: Secondary | ICD-10-CM | POA: Diagnosis not present

## 2021-09-23 ENCOUNTER — Telehealth: Payer: Self-pay

## 2021-09-23 NOTE — Telephone Encounter (Signed)
Patient has not been taking it, said she would start now.

## 2021-09-23 NOTE — Telephone Encounter (Signed)
Her lithium level is 0.8.  She usually runs levels around 0.6-0.7.  She was noting a little bit of irritability and anger at her last visit so I am hesitant to reduce the lithium.  I had given her a prescription for propranolol but I do not know if she took it.  Please call And see if she tried the propranolol.  The instructions were 1-2 tablets twice daily as needed for tremor.  If she had no side effects she could try even 3 or 4 at a time but if she goes too high she will start feeling tired from the propranolol.  It is useful to know whether that medicine helped her tremor in order to decide on the next step

## 2021-09-23 NOTE — Telephone Encounter (Signed)
Noted. Thanks.

## 2021-09-23 NOTE — Telephone Encounter (Signed)
Patient has not been taking it, said she would start now.   

## 2021-09-24 ENCOUNTER — Other Ambulatory Visit: Payer: Self-pay | Admitting: Psychiatry

## 2021-09-24 DIAGNOSIS — F319 Bipolar disorder, unspecified: Secondary | ICD-10-CM

## 2021-09-25 ENCOUNTER — Telehealth: Payer: Self-pay | Admitting: Psychiatry

## 2021-09-25 DIAGNOSIS — F319 Bipolar disorder, unspecified: Secondary | ICD-10-CM

## 2021-09-25 DIAGNOSIS — L299 Pruritus, unspecified: Secondary | ICD-10-CM

## 2021-09-25 DIAGNOSIS — F5105 Insomnia due to other mental disorder: Secondary | ICD-10-CM

## 2021-09-25 MED ORDER — DOXEPIN HCL 25 MG PO CAPS: ORAL_CAPSULE | ORAL | 0 refills | Status: DC

## 2021-09-25 NOTE — Telephone Encounter (Signed)
[name] already sent in Rx for lithium. Patient should not be taking trazodone per 5/15 visit. Will send in requested # of doxepin.

## 2021-09-25 NOTE — Telephone Encounter (Signed)
Last appt was 08/24/21. Kaitlyn Lowe states that she is currently at [facility], [place] and has left her medication at home. She will be at [facility] until this Tuesday. She asks if we can please send 5 Trazodone, 6 Lithium and 5 Doxepin to the following pharmacy.   [facility], [address], [address], [place], [place]. Telephone number is [phone].

## 2021-09-25 NOTE — Telephone Encounter (Signed)
Pt lvm last night at 5:34 pm and said that she left all of her mediciations at home. Please send in all of her meds lithium , trazadone, prozac and doxepin. She is on vacation for 5 days

## 2021-09-25 NOTE — Telephone Encounter (Signed)
Pt LVM on 6/15@ 5:34p.  She is [facility] for 5 days.  She has left all her meds at home.  The pharmacist will not refill her Trazadone or Lithium without a doctor's script.  Pls send to  [facility], [address] [place], [place] [address]  Phone [phone]  No upcoming appt scheduled.

## 2021-10-01 ENCOUNTER — Telehealth: Payer: Self-pay | Admitting: Psychiatry

## 2021-10-01 NOTE — Telephone Encounter (Signed)
Last visit was 08/24/21. Kaitlyn Lowe states she was put on Doxepin and stopped her Trazodone on last visit. She says that this is not working for her and not helping her sleep. Her phone number is [phone]. Pharmacy is:  [id number] [place], [place] - [address] AT [facility] [address] [facility] [address] [facility] [address] [facility] [address]  Phone:  [phone]  Fax:  [phone]

## 2021-10-02 ENCOUNTER — Other Ambulatory Visit: Payer: Self-pay | Admitting: Psychiatry

## 2021-10-02 MED ORDER — ESZOPICLONE 2 MG PO TABS
2.0000 mg | ORAL_TABLET | Freq: Every evening | ORAL | 0 refills | Status: DC | PRN
Start: 2021-10-02 — End: 2021-10-14

## 2021-10-07 ENCOUNTER — Telehealth: Payer: Self-pay | Admitting: Psychiatry

## 2021-10-07 ENCOUNTER — Encounter: Payer: Self-pay | Admitting: Family Medicine

## 2021-10-07 MED ORDER — LAMOTRIGINE 25 MG PO TABS: ORAL_TABLET | ORAL | 1 refills | Status: DC

## 2021-10-07 NOTE — Telephone Encounter (Signed)
Pt lvm last night that she is experience  lots of depression. She cancelled a week long vacation. She said the lunesta helps her sleep but needs something for the depression. Please call her at [phone]

## 2021-10-07 NOTE — Telephone Encounter (Signed)
RTC  Don't think trazodone has anything to do with depression.  Depression seemed to start with insomnia.  No SI.  No motivation.  Hopeless. Alfonso Patten is helping sleep.  Counseled patient regarding potential benefits, risks, and side effects of Lamictal to include potential risk of Stevens-Johnson syndrome. Advised patient to stop taking Lamictal and contact office immediately if rash develops and to seek urgent medical attention if rash is severe and/or spreading quickly. Will start Lamictal 25 mg daily for 2 weeks, then increase to 50 mg daily for 2 weeks, then 100 mg daily   Move up appointment  Meredith Staggers, MD, DFAPA

## 2021-10-07 NOTE — Telephone Encounter (Signed)
Pt stated she has been more depressed lately and nothing has triggered it.She is still taking Prozac daily.She said she has no feelings and no motivation.She wants to know if something can help with this

## 2021-10-10 DIAGNOSIS — H6123 Impacted cerumen, bilateral: Secondary | ICD-10-CM | POA: Diagnosis not present

## 2021-10-12 ENCOUNTER — Telehealth: Payer: Self-pay | Admitting: Psychiatry

## 2021-10-12 NOTE — Telephone Encounter (Signed)
Next appt is 12/31/21. Leahanna states she has been taking Lunesta and Trazodone for sleep but she is still not sleeping and getting only 2 hours a night. Please call her at [phone].  Pharmacy is:  [facility] #[id number] [place] Otto, [place] - [address] AT [facility] [address] [facility] [address]  Phone:  [phone]  Fax:  [phone]

## 2021-10-12 NOTE — Telephone Encounter (Signed)
Pt stated lamotrigine was only helpful for a couple of days.It helps her fall asleep when she takes at 10pm she is up by 12am.She takes a trazodone when she wakes up and is not able to go back to bed still.She wants to know if there is a higher dose of lunesta.She also mentioned that Lamictal is working well for her.

## 2021-10-14 ENCOUNTER — Other Ambulatory Visit: Payer: Self-pay | Admitting: Psychiatry

## 2021-10-14 MED ORDER — ESZOPICLONE 3 MG PO TABS: 3.0000 mg | ORAL_TABLET | Freq: Every evening | ORAL | 0 refills | Status: DC | PRN

## 2021-10-14 NOTE — Telephone Encounter (Signed)
Make sure she is not having any caffeine after lunch.  Ask her if she is having any manic symptoms. Otherwise okay we will increase Lunesta to the highest dose of 3 mg at night.  I will send in that prescription.

## 2021-10-14 NOTE — Telephone Encounter (Signed)
LVM to rtc 

## 2021-10-14 NOTE — Telephone Encounter (Signed)
Pt informed

## 2021-10-22 ENCOUNTER — Telehealth: Payer: Self-pay | Admitting: Psychiatry

## 2021-10-22 MED ORDER — QUETIAPINE FUMARATE 50 MG PO TABS
ORAL_TABLET | ORAL | 0 refills | Status: DC
Start: 2021-10-22 — End: 2021-10-26

## 2021-10-22 NOTE — Telephone Encounter (Signed)
Pt called reporting. She falls a sleep for about an hour. After waking she can't go back to sleep. Currently taking Eszopiclone. 3 mg Contact # [phone]

## 2021-10-22 NOTE — Telephone Encounter (Signed)
RTC  Still EMA with Lunest 3 mg HS Mood a little flat.  No manic sx except racing thoughts at night. Not always anxious thoughts.   Getting no naps daytime.  No caffeine. She's willing to try Seroquel  50-100 mg HS Stop Lunesta DT NR Meredith Staggers, MD, DFAPA

## 2021-10-22 NOTE — Telephone Encounter (Signed)
On 7/3 you increased lunesta to 3 mg due to same issue ,it's still not helping her sleep.Please advise

## 2021-10-26 ENCOUNTER — Telehealth: Payer: Self-pay | Admitting: Psychiatry

## 2021-10-26 ENCOUNTER — Other Ambulatory Visit: Payer: Self-pay | Admitting: Psychiatry

## 2021-10-26 MED ORDER — CLONAZEPAM 1 MG PO TABS: ORAL_TABLET | ORAL | 0 refills | Status: DC

## 2021-10-26 NOTE — Telephone Encounter (Signed)
Sent prescription for clonazepam.

## 2021-10-26 NOTE — Telephone Encounter (Signed)
Taking 4 tablets of Lunesta 3 mg is dangerous and I clearly cannot prescribe that much to her.  The maximum allowable amount is 1 tablet.  So we cannot continue Lunesta since it is not working at the usual dosage. If she primarily having trouble going to sleep sleep or staying asleep ? Please define what she means by saying that Seroquel feels like "poison".  Because she describes it is not helping her sleep but apparently causing some sort of side effect.  What is that side effect?  The next safest option would be to use something like Xanax or clonazepam.  There is one stronger option of chlorpromazine but I would rather reserve that in case all other options fail. So stopped Lunesta and she can start Xanax 0.5 mg tablets 1-2 nightly or I can give her an equivalent dose of clonazepam if she prefers to take that over the Xanax.

## 2021-10-26 NOTE — Telephone Encounter (Signed)
Patient is having trouble getting to sleep. She said once she gets to sleep she can stay asleep until she needs to get up to RR. She said she didn't really know how to describe the Seroquel. She says it is like a toxic feeling in her bloodstream. She doesn't feel manic or wired up, no SE that she could describe. She said she doesn't feel calm when she takes it.  She said she thought she had taken Xanax in the past and it didn't do anything for her. She is willing to try clonazepam.  I told her that she needed to stop the [facility] and she expressed understanding.   Pharmacy is [facility] on [address].

## 2021-10-26 NOTE — Telephone Encounter (Signed)
Patient still c/o not being able to sleep. She took 2 Seroquel with no benefit and the next night she took 3. She said she feels like "she has poison" in her from the Seroquel and doesn't like it. She took 4 tablets of 3 mg Lunesta for 2 nights and said she sleeps well. She said no caffeine after noon and she has a normal bedtime routine where she reads before going to bed.

## 2021-10-26 NOTE — Telephone Encounter (Signed)
Next visit is 01/02/22. Kaitlyn Lowe called and said that her Seroquel is not working. She took 2 Seroquel and they didn't do anything. The next night she took 3 Seroquel and they didn't do anything. She states please help me get some sleep. Her phone number is [phone].

## 2021-10-26 NOTE — Telephone Encounter (Signed)
Please call back to her about Thursday or Friday to see if she is sleeping better

## 2021-10-27 ENCOUNTER — Ambulatory Visit (INDEPENDENT_AMBULATORY_CARE_PROVIDER_SITE_OTHER): Payer: Medicare PPO | Admitting: Psychiatry

## 2021-10-27 ENCOUNTER — Encounter: Payer: Self-pay | Admitting: Psychiatry

## 2021-10-27 DIAGNOSIS — L299 Pruritus, unspecified: Secondary | ICD-10-CM | POA: Diagnosis not present

## 2021-10-27 DIAGNOSIS — F5105 Insomnia due to other mental disorder: Secondary | ICD-10-CM

## 2021-10-27 DIAGNOSIS — Z79899 Other long term (current) drug therapy: Secondary | ICD-10-CM | POA: Diagnosis not present

## 2021-10-27 DIAGNOSIS — F3132 Bipolar disorder, current episode depressed, moderate: Secondary | ICD-10-CM

## 2021-10-27 DIAGNOSIS — F319 Bipolar disorder, unspecified: Secondary | ICD-10-CM | POA: Diagnosis not present

## 2021-10-27 DIAGNOSIS — G3184 Mild cognitive impairment, so stated: Secondary | ICD-10-CM | POA: Diagnosis not present

## 2021-10-27 MED ORDER — LITHIUM CARBONATE 300 MG PO CAPS: ORAL_CAPSULE | ORAL | 0 refills | Status: DC

## 2021-10-27 MED ORDER — LAMOTRIGINE 100 MG PO TABS: 100.0000 mg | ORAL_TABLET | Freq: Every day | ORAL | 1 refills | Status: DC

## 2021-10-27 NOTE — Progress Notes (Signed)
Kaitlyn Lowe [id number] Dec 19, 1950 71 y.o.    Subjective:   Patient ID:  Kaitlyn Lowe is a 71 y.o. (DOB Apr 09, 1951) female.  Chief Complaint:  Chief Complaint  Patient presents with   Follow-up    Bipolar I disorder (HCC)    HPI Kaitlyn Lowe presents to the office today for follow-up of bipolar disorder.  seen February 08, 2018 with no med changes.  She has had a period of several years of stability.  06/03/20 appt with following noted: Stable mood.  Concerns about memory in conversation.  Not forgetting bills. No SE with meds and no mood swings.   Some trouble getting to sleep.  No caffeine after 5 PM.  2 glasses of tea at lunch.  Sleep interrupted by nocturia 3-4 times and may take an hour or 2 to go to sleep.  Sleep problems for a year.  Not drowsy daytime.  No naps.  Reads until 11 and might get up 930 or 1030. Still itching and that contributes to insomnia also. Still doing well without problems except itching really only at night.  No rash.  No history of allergies.   Patient reports stable mood and denies depressed or irritable moods.  Patient denies any recent difficulty with anxiety.  Denies appetite disturbance.  Patient reports that energy and motivation have been good.  Patient denies any difficulty with concentration.  Patient denies any suicidal ideation. Plan: Continue lithium 600 mg, fluoxetine 10 mg, trazodone 100 mg nightly  08/24/2021 appointment with the following noted: Itching only at night mostly head and some in body. Unrlieved by hydroxyzine 25 mg A little more irritable and easier to anger in flashes with trigger. Tremor in L hand a problem carrying a glass of wine. Friend notices memory not as good.  No problems with leaving pots on stove but will forget purse.  Some longterm memory loss of events she thinks its excessive. TKR September 10 2020 A lot of nights sleep doesn't start until 3 but sleeps until 10. But goes to bed to read about 1030.   10/25/21 appt  noted: Multiple phone calls since here. CO insomnia. Last night took clonazepam and slept welll for 5 hours at 1 mg and awoke.  No SE A month ago good friend died pancreatic CA but didn't let people know.  Unnerved me and started spiral of insomnia.  Still feels flat and no joy.  Even towards dog.Low motivation. Increased lamotrigine 50 mg and DT increase 100 mg soon and asks about SE. Continue lithium 600 mg, fluoxetine 10 mg No Trazodone   Past Psychiatric Medication Trials:  Hydroxyzine NR for itching at 50 mg HS Trazodone 100 worked Publishing copy. Doxepin NR, Seroquel NR and SE Clonazepam 1 mg HS Lunesta 3  Hx Xanax euphoria and insomnia  Hx Elna Breslow MD Jefm Petty MD [facility]  Review of Systems:  Review of Systems  Constitutional:  Positive for fatigue.  Skin:  Negative for rash.       Itching at night  Neurological:  Negative for tremors and weakness.  Psychiatric/Behavioral:  Positive for dysphoric mood.     Medications: I have reviewed the patient's current medications.  Current Outpatient Medications  Medication Sig Dispense Refill   azelastine (ASTELIN) 0.1 % nasal spray Place 1 spray into both nostrils daily as needed for rhinitis or allergies. Use in each nostril as directed     benzonatate (TESSALON) 200 MG capsule Take 200 mg by mouth 3 (three) times daily as  needed for cough.     cholecalciferol (VITAMIN D3) 25 MCG (1000 UNIT) tablet Take 1,000 Units by mouth daily.     clonazePAM (KLONOPIN) 1 MG tablet 1 to 2 tablets at night as needed for severe insomnia 30 tablet 0   docusate sodium (COLACE) 100 MG capsule Take 200 mg by mouth in the morning.     FLUoxetine (PROZAC) 10 MG capsule TAKE 1 CAPSULE(10 MG) BY MOUTH DAILY 90 capsule 3   levothyroxine (SYNTHROID) 100 MCG tablet Take 100 mcg by mouth daily before breakfast.     meloxicam (MOBIC) 15 MG tablet Take 15 mg by mouth daily.     naproxen sodium (ANAPROX) 220 MG tablet Take 440 mg by  mouth daily.     Omeprazole 20 MG TBEC Take 20 mg by mouth daily.     Polyethyl Glycol-Propyl Glycol (SYSTANE OP) Place 1 drop into both eyes in the morning.     propranolol (INDERAL) 10 MG tablet Take 1-2 tabs po BID prn anxiety 100 tablet 0   lamoTRIgine (LAMICTAL) 100 MG tablet Take 1 tablet (100 mg total) by mouth daily. 30 tablet 1   lithium carbonate 300 MG capsule TAKE 2 CAPSULES(600 MG) BY MOUTH AT BEDTIME 180 capsule 0   No current facility-administered medications for this visit.    Medication Side Effects: None  Allergies:  Allergies  Allergen Reactions   Codeine Nausea Only    Past Medical History:  Diagnosis Date   Depression     Family History  Problem Relation Age of Onset   Arthritis Mother    Mental illness Mother    Hypertension Mother     Social History   Socioeconomic History   Marital status: Divorced    Spouse name: Not on file   Number of children: Not on file   Years of education: Not on file   Highest education level: Not on file  Occupational History   Not on file  Tobacco Use   Smoking status: Never   Smokeless tobacco: Never  Vaping Use   Vaping Use: Never used  Substance and Sexual Activity   Alcohol use: Yes    Comment: three times a week 2 drinks at a time.   Drug use: No   Sexual activity: Not on file  Other Topics Concern   Not on file  Social History Narrative   Not on file   Social Determinants of Health   Financial Resource Strain: Not on file  Food Insecurity: Not on file  Transportation Needs: Not on file  Physical Activity: Not on file  Stress: Not on file  Social Connections: Not on file  Intimate Partner Violence: Not on file    Past Medical History, Surgical history, Social history, and Family history were reviewed and updated as appropriate.   Please see review of systems for further details on the patient's review from today.   Objective:   Physical Exam:  There were no vitals taken for this  visit.  Physical Exam Neurological:     Mental Status: She is alert and oriented to person, place, and time.     Cranial Nerves: No dysarthria.  Psychiatric:        Attention and Perception: Attention and perception normal.        Mood and Affect: Mood is depressed. Mood is not anxious.        Speech: Speech normal. Speech is not rapid and pressured.        Behavior: Behavior is  cooperative.        Thought Content: Thought content normal. Thought content is not paranoid or delusional. Thought content does not include homicidal or suicidal ideation. Thought content does not include suicidal plan.        Cognition and Memory: Cognition and memory normal.        Judgment: Judgment normal.     Comments: Insight intact     Lab Review:     Component Value Date/Time   NA 140 08/27/2021 1011   K 4.3 08/27/2021 1011   CL 103 08/27/2021 1011   CO2 29 08/27/2021 1011   GLUCOSE 87 08/27/2021 1011   BUN 14 08/27/2021 1011   CREATININE 0.91 08/27/2021 1011   CALCIUM 10.5 (H) 08/27/2021 1011   PROT 6.9 10/17/2012 1537   ALBUMIN 4.1 10/17/2012 1537   AST 21 10/17/2012 1537   ALT 17 10/17/2012 1537   ALKPHOS 36 (L) 10/17/2012 1537   BILITOT 0.7 10/17/2012 1537   GFRNONAA >60 08/13/2020 1500   GFRAA >60 01/08/2019 1520       Component Value Date/Time   WBC 7.4 01/08/2019 1520   RBC 4.33 01/08/2019 1520   HGB 13.1 01/08/2019 1520   HCT 41.8 01/08/2019 1520   PLT 269 01/08/2019 1520   MCV 96.5 01/08/2019 1520   MCH 30.3 01/08/2019 1520   MCHC 31.3 01/08/2019 1520   RDW 12.6 01/08/2019 1520   LYMPHSABS 1.0 10/17/2012 1537   MONOABS 0.5 10/17/2012 1537   EOSABS 0.1 10/17/2012 1537   BASOSABS 0.0 10/17/2012 1537    Lithium Lvl  Date Value Ref Range Status  08/27/2021 0.8 0.6 - 1.2 mmol/L Final  08/27/21 lithium level 0.8 on 600 mg daily   TSH 3.2, normal folate and low normal B12   No results found for: "PHENYTOIN", "PHENOBARB", "VALPROATE", "CBMZ"   .res Assessment: Plan:     Kaitlyn Lowe was seen today for follow-up.  Diagnoses and all orders for this visit:  Bipolar disorder with moderate depression (HCC)  Mild cognitive impairment  Insomnia due to mental condition  Lithium use  Pruritic condition  Bipolar I disorder (HCC) -     lithium carbonate 300 MG capsule; TAKE 2 CAPSULES(600 MG) BY MOUTH AT BEDTIME -     lamoTRIgine (LAMICTAL) 100 MG tablet; Take 1 tablet (100 mg total) by mouth daily.  Hx Very stable bipolar with good response to LITHIUM and fluoxetine until lately.  Discussed mood cycling risk with fluoxetine or other SSRIs in bipolar patients but she has benefited and been stable.  Sleep problems with trazodone Apotex generic. Disc itching and workup from PCP.  Disc use of antihistamines.   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  Discussed need for periodic lab monitoring to determine drug level and to assess for potential adverse effects.  Counseled patient regarding signs and symptoms of lithium toxicity and advised that they notify office immediately or seek urgent medical attention if experiencing these signs and symptoms.  Patient advised to contact office with any questions or concerns.  Option propranolol 10-20 mg BID prn tremor.  Although the tremor characteristics are more consistent with PD.  Disc with PCP  Creatinine was 0.82 in September, calcium 9.9 and overall BMP was unremarkable at that time with a normal CBC.  Lithium level May 12, 2018 was 0.6. 08/27/21 lithium level 0.8 on 600 mg daily   DT  forgetfulness check B12 and folate and TSH Continue lithium 600 HS  Increase lamotrigine to 100 mg as planned DC fluoxetine DT failure Add B complex DT low normal B12 Clonazepam for TR ins 1-2 mg HS  Counseled patient regarding potential benefits, risks, and side effects of Lamictal to  include potential risk of Stevens-Johnson syndrome. Advised patient to stop taking Lamictal and contact office immediately if rash develops and to seek urgent medical attention if rash is severe and/or spreading quickly. Will start Lamictal 25 mg daily for 2 weeks, then increase to 50 mg daily for 2 weeks, then 100 mg daily  Consider Wellbutrin.  She'd rather wait and take less meds.   Disc SE  We discussed the short-term risks associated with benzodiazepines including sedation and increased fall risk among others.  Discussed long-term side effect risk including dependence, potential withdrawal symptoms, and the potential eventual dose-related risk of dementia.  But recent studies from 2020 dispute this association between benzodiazepines and dementia risk. Newer studies in 2020 do not support an association with dementia.  Fu 6 weeks  Meredith Staggers, MD, DFAPA'  Please see After Visit Summary for patient specific instructions.    Future Appointments  Date Time Provider Department Center  12/31/2021  9:30 AM Cottle, Steva Ready., MD [facility] None    No orders of the defined types were placed in this encounter.   -------------------------------

## 2021-10-27 NOTE — Patient Instructions (Signed)
Add B12 vitamin daily Stop fluoxetine Increase lamotrigine to 100 mg daily

## 2021-10-29 NOTE — Telephone Encounter (Signed)
I understand she would like to sleep longer but is getting 7 hours sleep with clonzepam.  That is clearly better.  I don't think we should change sleep meds further right now.

## 2021-10-29 NOTE — Telephone Encounter (Signed)
Called to F/U with patient. She said she takes 2 clonazepam around 10:30-11:00 PM and is able to get to sleep, but only sleeps until around 6 AM and can't go back to sleep. She would like to be able to sleep later/go back to sleep.

## 2021-11-03 DIAGNOSIS — M19071 Primary osteoarthritis, right ankle and foot: Secondary | ICD-10-CM | POA: Diagnosis not present

## 2021-11-03 DIAGNOSIS — M19072 Primary osteoarthritis, left ankle and foot: Secondary | ICD-10-CM | POA: Diagnosis not present

## 2021-11-06 ENCOUNTER — Other Ambulatory Visit: Payer: Self-pay | Admitting: Psychiatry

## 2021-11-06 DIAGNOSIS — L299 Pruritus, unspecified: Secondary | ICD-10-CM

## 2021-11-09 ENCOUNTER — Other Ambulatory Visit: Payer: Self-pay | Admitting: Psychiatry

## 2021-11-09 NOTE — Telephone Encounter (Signed)
Pt LVM @ 1:20p.  She said she was calling back about someone calling her about the need for the Clonazepam.  She said that Dr Jennelle Human told her that she was to take 2 pills per night and that is the reason she needs the refill now. She asked that it be sent to [facility] and [place].  Next appt 9/21

## 2021-11-10 ENCOUNTER — Other Ambulatory Visit: Payer: Self-pay | Admitting: Psychiatry

## 2021-11-10 DIAGNOSIS — L299 Pruritus, unspecified: Secondary | ICD-10-CM

## 2021-11-10 NOTE — Telephone Encounter (Signed)
Pt lvm that she wants dr. Jennelle Human to know that she is shuffling and leaning forward. Also, her tremors on her left arm is worse. She doesn't know if this is from medication. Please call her at [phone]

## 2021-11-11 ENCOUNTER — Telehealth: Payer: Self-pay

## 2021-11-11 ENCOUNTER — Other Ambulatory Visit: Payer: Self-pay | Admitting: Psychiatry

## 2021-11-11 NOTE — Progress Notes (Signed)
Stopped fluoxetine at the last visit due to lack of efficacy.

## 2021-11-11 NOTE — Telephone Encounter (Signed)
Ok to send for itching if it helps

## 2021-11-11 NOTE — Telephone Encounter (Signed)
Is it ok to send in hydroxyzine?She said she takes it for itching but I denied the refill because it was a discontinued med

## 2021-11-11 NOTE — Telephone Encounter (Signed)
Pt informed

## 2021-11-11 NOTE — Telephone Encounter (Signed)
She is not taking any medications that could or should cause her to shop for her feet or leaning forward.  The lithium can cause a tremor but should not cause these other problems.  Please have her contact her medical doctor to be evaluated.

## 2021-11-12 ENCOUNTER — Other Ambulatory Visit: Payer: Self-pay

## 2021-11-12 MED ORDER — HYDROXYZINE PAMOATE 25 MG PO CAPS: ORAL_CAPSULE | ORAL | 0 refills | Status: DC

## 2021-11-18 ENCOUNTER — Other Ambulatory Visit: Payer: Self-pay | Admitting: Psychiatry

## 2021-11-19 DIAGNOSIS — R413 Other amnesia: Secondary | ICD-10-CM | POA: Diagnosis not present

## 2021-11-19 DIAGNOSIS — R350 Frequency of micturition: Secondary | ICD-10-CM | POA: Diagnosis not present

## 2021-11-19 DIAGNOSIS — E039 Hypothyroidism, unspecified: Secondary | ICD-10-CM | POA: Diagnosis not present

## 2021-11-19 DIAGNOSIS — R2689 Other abnormalities of gait and mobility: Secondary | ICD-10-CM | POA: Diagnosis not present

## 2021-11-23 DIAGNOSIS — M2022 Hallux rigidus, left foot: Secondary | ICD-10-CM | POA: Diagnosis not present

## 2021-11-23 DIAGNOSIS — M2021 Hallux rigidus, right foot: Secondary | ICD-10-CM | POA: Diagnosis not present

## 2021-11-25 DIAGNOSIS — Z780 Asymptomatic menopausal state: Secondary | ICD-10-CM | POA: Diagnosis not present

## 2021-11-25 DIAGNOSIS — M810 Age-related osteoporosis without current pathological fracture: Secondary | ICD-10-CM | POA: Diagnosis not present

## 2021-11-25 DIAGNOSIS — M8588 Other specified disorders of bone density and structure, other site: Secondary | ICD-10-CM | POA: Diagnosis not present

## 2021-11-26 ENCOUNTER — Other Ambulatory Visit: Payer: Self-pay | Admitting: *Deleted

## 2021-11-26 NOTE — Patient Outreach (Signed)
  Care Coordination   11/26/2021 Name: Kaitlyn Lowe MRN: [id number] DOB: 03-27-1951   Care Coordination Outreach Attempts:  An unsuccessful telephone outreach was attempted today to offer the patient information about available care coordination services as a benefit of their health plan.   Follow Up Plan:  Additional outreach attempts will be made to offer the patient care coordination information and services.   Encounter Outcome:  No Answer  Care Coordination Interventions Activated:  No   Care Coordination Interventions:  No, not indicated    Elliot Cousin, RN Care Management Coordinator [facility] Main Office [phone]

## 2021-11-28 ENCOUNTER — Other Ambulatory Visit: Payer: Self-pay | Admitting: Psychiatry

## 2021-12-02 ENCOUNTER — Other Ambulatory Visit: Payer: Self-pay

## 2021-12-02 ENCOUNTER — Ambulatory Visit: Payer: Self-pay

## 2021-12-02 MED ORDER — HYDROXYZINE PAMOATE 25 MG PO CAPS
ORAL_CAPSULE | ORAL | 0 refills | Status: DC
Start: 2021-12-02 — End: 2022-02-15

## 2021-12-02 NOTE — Patient Outreach (Signed)
  Care Coordination   Initial Visit Note   12/02/2021 Name: Loyce Flaming MRN: [id number] DOB: 05-24-1950  Teniqua Marron is a 71 y.o. year old female who sees Shirlean Mylar, MD for primary care. I spoke with  Kenard Gower by phone today  What matters to the patients health and wellness today?  Patient declined to participate in todays call and hung up on caller.    Goals Addressed   None     SDOH assessments and interventions completed:  No     Care Coordination Interventions Activated:  No  Care Coordination Interventions:  No, not indicated   Follow up plan: No further intervention required.   Encounter Outcome:  Pt. Refused   Bevelyn Ngo, BSW, CDP Social Worker, Certified Dementia Practitioner Care Coordination [phone]

## 2021-12-09 ENCOUNTER — Other Ambulatory Visit: Payer: Self-pay | Admitting: Psychiatry

## 2021-12-09 NOTE — Telephone Encounter (Signed)
Last filled 11/09/21 appt 9/21

## 2021-12-19 ENCOUNTER — Other Ambulatory Visit: Payer: Self-pay | Admitting: Psychiatry

## 2021-12-19 DIAGNOSIS — F319 Bipolar disorder, unspecified: Secondary | ICD-10-CM

## 2021-12-21 DIAGNOSIS — M810 Age-related osteoporosis without current pathological fracture: Secondary | ICD-10-CM | POA: Diagnosis not present

## 2021-12-25 ENCOUNTER — Other Ambulatory Visit: Payer: Self-pay | Admitting: Psychiatry

## 2021-12-25 DIAGNOSIS — F319 Bipolar disorder, unspecified: Secondary | ICD-10-CM

## 2021-12-28 ENCOUNTER — Encounter: Payer: Self-pay | Admitting: Neurology

## 2021-12-28 ENCOUNTER — Ambulatory Visit: Payer: Medicare PPO | Admitting: Neurology

## 2021-12-28 VITALS — BP 148/80 | HR 60 | Ht 62.0 in | Wt 169.4 lb

## 2021-12-28 DIAGNOSIS — G20 Parkinson's disease: Secondary | ICD-10-CM

## 2021-12-28 DIAGNOSIS — R251 Tremor, unspecified: Secondary | ICD-10-CM | POA: Diagnosis not present

## 2021-12-28 DIAGNOSIS — Z9181 History of falling: Secondary | ICD-10-CM | POA: Diagnosis not present

## 2021-12-28 DIAGNOSIS — R269 Unspecified abnormalities of gait and mobility: Secondary | ICD-10-CM | POA: Diagnosis not present

## 2021-12-28 NOTE — Patient Instructions (Signed)
It was nice to meet you today.  You have some evidence of parkinsonism, unclear if you have Parkinson's disease versus drug-induced parkinsonism.    You have fallen and you are at fall risk.  Your risk of fall is increased because of your arthritis and back issue, please use your cane at all times.  As far as your medications are concerned, I would like hold off on any meds at this time.   As far as diagnostic testing, I would like to order a so-called DaT scan: This is a specialized brain scan designed to help with diagnosis of tremor disorders. A radioactive marker gets injected and the uptake is measured in the brain and compared to normal controls and right side is compared to the left, a change in uptake can help with diagnosis of certain tremor disorders. A brain MRI on the other hand is a brain scan that helps look at the brain structure in more detail overall and look for age-related changes, blood vessel related changes and look for stroke and volume loss which we call atrophy.   Our phone number is [phone]. We also have an after hours call service for urgent matters and there is a physician on-call for urgent questions, that cannot wait till the next work day. For any emergencies you know to call 911 or go to the nearest emergency room.   You can email me through my chart and also leave a phone message for my nurse.   I did not see any medications on your list of medications that would interfere with your DaTscan.   Please talk to psychiatrist about drinking alcohol on the weekend.  Alcohol does not typically combine well with clonazepam, Lamictal and lithium.  Lithium can cause tremors and in some instances Parkinson's-like changes in some patients.

## 2021-12-28 NOTE — Progress Notes (Signed)
tSubjective:    Patient ID: Kaitlyn Lowe is a 71 y.o. female.  HPI    Star Age, MD, PhD [facility] [address], [address] [place], [place] [address]  Dear Dr. Lindell Noe,   I saw your patient, Kaitlyn Lowe, upon your request to clinic today for initial consultation of her gait disorder, and memory concerns.  The patient is unaccompanied today.  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  She has noticed changes in her balance and her gait, she has fallen several times, recently fell and scraped her right forearm, she bandaged it up herself, did not seek medical attention.  I reviewed your office note from 11/19/2021.  She had blood work through your office at the time and I reviewed the results: TSH was 1.5, B12 588, folate 8.3, CMP showed glucose of 85, BUN 25, creatinine 0.99, sodium 140, potassium 4.5, alk phos 43, AST 18, ALT 19.  Of note, she is on several psychotropic medications including lithium 600 mg daily, she is also on Lamictal, fluoxetine, clonazepam and hydroxyzine, full list of medications as below.  She is followed by psychiatry for her bipolar disorder.  She has significant arthritis in both feet, they heard all the time and she has seen at least 1 if not to podiatrist.  She is followed by orthopedics for her knee arthritis, status post right total knee replacement and may need her left knee replaced next year.  She has been using a three-point cane outside.  She has sometimes trouble walking the dog as she cannot keep up with them.  She lives alone, no children.  She has a good friend that sees her  regularly.  She has had some word finding difficulty and some trouble navigating while driving, drove herself today and did not get lost.  She has trouble using the GPS though.   She drinks caffeine in the form of tea, 2 with lunch, no caffeine after lunch.  She drinks alcohol on the weekends, typically 1 cocktail and 1 glass of wine with dinner.  Of note, she takes clonazepam to help her sleep at night.  She does not sleep well.  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  She had low back surgery and has hardware in place.  She does have an abnormal posture.  She feels that her legs give out sometimes.  She denies any lightheadedness or vertiginous symptoms.   Her Past Medical History Is Significant For: Past Medical History:  Diagnosis Date   Depression     Her Past Surgical History Is Significant For: Past Surgical History:  Procedure Laterality Date   ABDOMINAL HYSTERECTOMY  1994   BACK SURGERY     COLON SURGERY     INCISIONAL HERNIA REPAIR N/A 08/18/2020   Procedure: LAPAROSCOPIC INCISIONAL HERNIA REPAIR WITH MESH;  Surgeon: RRalene Ok MD;  [facility]  Service: General;  Laterality: N/A;   KNEE ARTHROSCOPY     bilateral knees.    Her Family  History Is Significant For: Family History  Problem Relation Age of Onset   Arthritis Mother    Mental illness Mother    Hypertension Mother    Transient ischemic attack Mother    Alzheimer's disease Neg Hx    Parkinson's disease Neg Hx    Tremor Neg Hx     Her Social History Is Significant For: Social History   Socioeconomic History   Marital status: Divorced    Spouse name: Not on file   Number of children: Not on file   Years of education: Not on file   Highest education level: Not on file  Occupational History   Not on file  Tobacco Use   Smoking status: Never    Smokeless tobacco: Never  Vaping Use   Vaping Use: Never used  Substance and Sexual Activity   Alcohol use: Yes    Alcohol/week: 12.0 standard drinks of alcohol    Types: 6 Glasses of wine, 6 Shots of liquor per week    Comment: three times a week 2 drinks at a time.   Drug use: No   Sexual activity: Not on file  Other Topics Concern   Not on file  Social History Narrative   Not on file   Social Determinants of Health   Financial Resource Strain: Not on file  Food Insecurity: Not on file  Transportation Needs: Not on file  Physical Activity: Not on file  Stress: Not on file  Social Connections: Not on file    Her Allergies Are:  Allergies  Allergen Reactions   Codeine Nausea Only  :   Her Current Medications Are:  Outpatient Encounter Medications as of 12/28/2021  Medication Sig   azelastine (ASTELIN) 0.1 % nasal spray Place 1 spray into both nostrils daily as needed for rhinitis or allergies. Use in each nostril as directed   cholecalciferol (VITAMIN D3) 25 MCG (1000 UNIT) tablet Take 1,000 Units by mouth daily.   clonazePAM (KLONOPIN) 1 MG tablet TAKE 1 TO 2 TABLETS BY MOUTH AT NIGHT AS NEEDED FOR SEVERE INSOMNIA   Cyanocobalamin (VITAMIN B 12 PO) Take by mouth.   diclofenac Sodium (VOLTAREN ARTHRITIS PAIN) 1 % GEL Apply topically as needed.   docusate sodium (COLACE) 100 MG capsule Take 200 mg by mouth in the morning.   lamoTRIgine (LAMICTAL) 100 MG tablet Take 1 tablet (100 mg total) by mouth daily.   levothyroxine (SYNTHROID) 100 MCG tablet Take 100 mcg by mouth daily before breakfast.   lithium carbonate 300 MG capsule TAKE 2 CAPSULES(600 MG) BY MOUTH AT BEDTIME   meloxicam (MOBIC) 15 MG tablet Take 15 mg by mouth daily.   MYRBETRIQ 25 MG TB24 tablet Take 25 mg by mouth daily.   naproxen sodium (ANAPROX) 220 MG tablet Take 440 mg by mouth daily.   Omeprazole 20 MG TBEC Take 20 mg by mouth daily.   Polyethyl Glycol-Propyl Glycol (SYSTANE OP) Place 1 drop into  both eyes in the morning.   benzonatate (TESSALON) 200 MG capsule Take 200 mg by mouth 3 (three) times daily as needed for cough.   hydrOXYzine (VISTARIL) 25 MG capsule TAKE 1 TO 2 CAPSULES BY MOUTH AT NIGHT AS NEEDED FOR ITCHING   propranolol (INDERAL) 10 MG tablet Take 1-2 tabs po BID prn anxiety (Patient not taking: Reported on 12/28/2021)   No facility-administered encounter medications on file as of 12/28/2021.  :   Review of Systems:  Out of a complete 14 point review of systems, all are  reviewed and negative with the exception of these symptoms as listed below:  Review of Systems  Neurological:        Pt here for Tremor consult   Pt states that tremors are in both arm and hands Pt states has to use straw to drink out of a cup  Pt states 3 falls in the last month,no ED visit needed . Pt states her gait is off and she has to walk with a cane Pt states short term memory is not good  Pt states speech at times are slow     Objective:  Neurological Exam  Physical Exam Physical Examination:   Vitals:   12/28/21 0808  BP: (!) 148/80  Pulse: 60    General Examination: The patient is a very pleasant 71 y.o. female in no acute distress. She appears well-developed and well-nourished and well groomed.   HEENT: Normocephalic, atraumatic, pupils are equal, round and reactive to light, extraocular tracking is good without limitation to gaze excursion or nystagmus noted. Hearing is grossly intact. Face is symmetric with mild facial masking, minimal to mild nuchal rigidity noted, intermittent lower jaw side-to-side tremor.  Voice is raspy, not dysarthric, raspiness is not new per patient. There are no carotid bruits on auscultation. Oropharynx exam reveals: moderate mouth dryness, adequate dental hygiene. Tongue protrudes centrally and palate elevates symmetrically.   Chest: Clear to auscultation without wheezing, rhonchi or crackles noted.  Heart: S1+S2+0, regular and normal without murmurs,  rubs or gallops noted.   Abdomen: Soft, non-tender and non-distended.  Extremities: There is no pitting edema in the distal lower extremities bilaterally.   Skin: Warm and dry without trophic changes noted.   Musculoskeletal: exam reveals arthritic changes in both hands, status post right total knee replacement with unremarkable scar noted.  Reports bilateral foot pain, lumbar kyphosis is noted with upper body tilt forward.    Neurologically:  Mental status: The patient is awake, alert and oriented in all 4 spheres. Her immediate and remote memory, attention, language skills and fund of knowledge are appropriate. There is no evidence of aphasia, agnosia, apraxia or anomia. Speech is clear with normal prosody and enunciation. Thought process is linear. Mood is normal and affect is normal.  Cranial nerves II - XII are as described above under HEENT exam.  Motor exam: Normal bulk, strength and tone is noted. There is an intermittent mild resting tremor in the left upper extremity and both lower extremities.  She has a mild postural tremor in the left upper extremity, minimal in the right upper extremity with mild action tremor, no intention tremor.    On 12/28/2021: Archimedes spiral drawing she has mild trembling with the left hand, minimal trembling with the right hand, handwriting is legible, slightly tremulous, not particularly micrographic.    Romberg is not tested for safety concerns, reflexes are 1+ in the upper extremities, trace in the left knee, absent in the right knee and absent in both ankles.  Fine motor skills and coordination: Mild difficulty with finger taps and movements as well as rapid alternating patting in the right upper extremity, moderate difficulty with the left.  With foot taps, she has mild difficulty on the right and moderate on the left.  Cerebellar testing: No dysmetria or intention tremor. There is no truncal or gait ataxia.  Sensory exam: intact to light touch in the  upper and lower extremities.  Gait, station and balance: She stands with minimal difficulty and pushes herself up.  She has slight  genu valgus.  Upper body is tilted forward in the lumbar spine.  She is able to walk without her cane, takes shorter steps and has decreased arm swing, no telltale shuffling.  Mild insecurity with turns.  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  History and examination are not telltale for Parkinson's disease but she does have on examination evidence of parkinsonism with some lateralization noted to the left.  It is difficult to discern if she has idiopathic Parkinson's disease versus parkinsonism, possibly drug-induced phenomenon due to being on lithium.  She is advised to proceed with a DaTscan which can be a valuable tool in evaluating.  Shins with tremor disorders especially with drug-induced tremors and parkinsonism versus idiopathic Parkinson's disease and essential tremor.  Findings are not telltale for essential tremor.  She does not have a family history of tremors either.  For now, she is advised to continue with her follow-up routinely with your office and her psychiatrist and continue with her current medications.  She is advised to be mindful of certain tremor triggers including stress, dehydration, caffeine, sleep deprivation or poor sleep consolidation.  She was given additional information for DaTscan.  I placed an order for this and we should be in touch with her once we have insurance authorization.  We will call her with her results and plan of follow-up accordingly. I answered all her questions today and she was in  agreement. Thank you very much for allowing me to participate in the care of this nice patient. If I can be of any further assistance to you please do not hesitate to call me at [phone].  Sincerely,   Star Age, MD, PhD

## 2021-12-29 ENCOUNTER — Telehealth: Payer: Self-pay | Admitting: Neurology

## 2021-12-29 NOTE — Telephone Encounter (Signed)
Pt called stating that she is needing to get the propranolol (INDERAL) 10 MG tablet and the hydrOXYzine (VISTARIL) 25 MG capsule taken off of her med list because she no longer uses those medications. She also states that yesterday in her appt she forgot to mention that she has had 2 concussions in the last 6 yrs.

## 2021-12-31 ENCOUNTER — Telehealth: Payer: Self-pay | Admitting: Neurology

## 2021-12-31 ENCOUNTER — Other Ambulatory Visit (HOSPITAL_COMMUNITY): Payer: Self-pay | Admitting: Neurology

## 2021-12-31 ENCOUNTER — Ambulatory Visit: Payer: Medicare PPO | Admitting: Psychiatry

## 2021-12-31 DIAGNOSIS — Z9181 History of falling: Secondary | ICD-10-CM

## 2021-12-31 DIAGNOSIS — G20 Parkinson's disease: Secondary | ICD-10-CM

## 2021-12-31 DIAGNOSIS — R269 Unspecified abnormalities of gait and mobility: Secondary | ICD-10-CM

## 2021-12-31 DIAGNOSIS — G20C Parkinsonism, unspecified: Secondary | ICD-10-CM

## 2021-12-31 DIAGNOSIS — R251 Tremor, unspecified: Secondary | ICD-10-CM

## 2021-12-31 NOTE — Telephone Encounter (Signed)
[facility] NPR case #[id number] sent to [facility]

## 2022-01-01 DIAGNOSIS — D692 Other nonthrombocytopenic purpura: Secondary | ICD-10-CM | POA: Diagnosis not present

## 2022-01-05 ENCOUNTER — Other Ambulatory Visit: Payer: Self-pay | Admitting: Psychiatry

## 2022-01-13 ENCOUNTER — Encounter (HOSPITAL_COMMUNITY)
Admission: RE | Admit: 2022-01-13 | Discharge: 2022-01-13 | Disposition: A | Discharge status: Home / Self Care | Payer: Medicare PPO | Source: Ambulatory Visit | Attending: Neurology | Admitting: Neurology

## 2022-01-13 ENCOUNTER — Telehealth: Payer: Self-pay | Admitting: *Deleted

## 2022-01-13 DIAGNOSIS — Z9181 History of falling: Secondary | ICD-10-CM | POA: Insufficient documentation

## 2022-01-13 DIAGNOSIS — G20C Parkinsonism, unspecified: Secondary | ICD-10-CM | POA: Insufficient documentation

## 2022-01-13 DIAGNOSIS — R269 Unspecified abnormalities of gait and mobility: Secondary | ICD-10-CM | POA: Diagnosis not present

## 2022-01-13 DIAGNOSIS — R251 Tremor, unspecified: Secondary | ICD-10-CM | POA: Insufficient documentation

## 2022-01-13 DIAGNOSIS — R2689 Other abnormalities of gait and mobility: Secondary | ICD-10-CM | POA: Diagnosis not present

## 2022-01-13 MED ORDER — POTASSIUM IODIDE (ANTIDOTE) 130 MG PO TABS
ORAL_TABLET | ORAL | Status: AC
  Administered 2022-01-13: 130 mg
  Filled 2022-01-13: qty 1

## 2022-01-13 MED ORDER — POTASSIUM IODIDE (ANTIDOTE) 130 MG PO TABS: 130.0000 mg | ORAL_TABLET | Freq: Once | ORAL | Status: DC

## 2022-01-13 MED ORDER — IOFLUPANE I 123 185 MBQ/2.5ML IV SOLN
4.5000 | Freq: Once | INTRAVENOUS | Status: AC | PRN
  Administered 2022-01-13: 4.5 via INTRAVENOUS
  Filled 2022-01-13: qty 5

## 2022-01-13 NOTE — Telephone Encounter (Signed)
LVM for patient to call back to go over test results. 

## 2022-01-13 NOTE — Telephone Encounter (Signed)
-----   Message from Star Age, MD sent at 01/13/2022  4:46 PM EDT ----- Please call patient and advise her that the recent DaTscan was reported as normal.  This would argue against an underlying true Parkinson's-like disease or Parkinson's disease and favor the possibility of drug-induced tremors and parkinsonism as we have discussed.  As discussed during the appointment, please ask patient to follow-up closely with primary care and her psychiatrist.

## 2022-01-14 NOTE — Telephone Encounter (Signed)
Spoke with Patient gave DATSCAN  results Pt expressed understanding . Pt wants to have results sent to her psychiatrist [name] Parents MD at [place]  Will forward to [facility] .

## 2022-01-14 NOTE — Telephone Encounter (Signed)
Pt returned call. Pt would like a calls back as soon as the RN can.

## 2022-01-15 ENCOUNTER — Telehealth: Payer: Self-pay | Admitting: Psychiatry

## 2022-01-15 NOTE — Telephone Encounter (Signed)
Patient lvm to inform Dr Clovis Pu that the Dat scan was normal. She is inquiring if any of her meds be causing the tremors. Please advise.  Contact information # [phone]

## 2022-01-15 NOTE — Telephone Encounter (Signed)
Please advise 

## 2022-01-21 ENCOUNTER — Other Ambulatory Visit: Payer: Self-pay | Admitting: Psychiatry

## 2022-01-21 DIAGNOSIS — F3132 Bipolar disorder, current episode depressed, moderate: Secondary | ICD-10-CM

## 2022-01-21 NOTE — Telephone Encounter (Signed)
Lithium level needs to be obtained.  We do not have one since May.  However go to [place] [facility] to get a lithium level as soon as possible.  Have her go in the morning after taking lithium the night before.

## 2022-01-21 NOTE — Telephone Encounter (Signed)
LVM with info and to rtc with any questions  

## 2022-01-26 DIAGNOSIS — F3132 Bipolar disorder, current episode depressed, moderate: Secondary | ICD-10-CM | POA: Diagnosis not present

## 2022-01-27 LAB — LITHIUM LEVEL: Lithium Lvl: 0.8 mmol/L (ref 0.6–1.2)

## 2022-01-27 NOTE — Telephone Encounter (Signed)
noted 

## 2022-02-01 DIAGNOSIS — D692 Other nonthrombocytopenic purpura: Secondary | ICD-10-CM | POA: Diagnosis not present

## 2022-02-01 DIAGNOSIS — R2681 Unsteadiness on feet: Secondary | ICD-10-CM | POA: Diagnosis not present

## 2022-02-08 ENCOUNTER — Other Ambulatory Visit: Payer: Self-pay | Admitting: Psychiatry

## 2022-02-10 DIAGNOSIS — M2022 Hallux rigidus, left foot: Secondary | ICD-10-CM | POA: Diagnosis not present

## 2022-02-10 DIAGNOSIS — M79672 Pain in left foot: Secondary | ICD-10-CM | POA: Diagnosis not present

## 2022-02-10 DIAGNOSIS — M79671 Pain in right foot: Secondary | ICD-10-CM | POA: Diagnosis not present

## 2022-02-15 ENCOUNTER — Encounter: Payer: Self-pay | Admitting: Psychiatry

## 2022-02-15 ENCOUNTER — Ambulatory Visit (INDEPENDENT_AMBULATORY_CARE_PROVIDER_SITE_OTHER): Payer: Medicare PPO | Admitting: Psychiatry

## 2022-02-15 DIAGNOSIS — G3184 Mild cognitive impairment, so stated: Secondary | ICD-10-CM

## 2022-02-15 DIAGNOSIS — F5105 Insomnia due to other mental disorder: Secondary | ICD-10-CM | POA: Diagnosis not present

## 2022-02-15 DIAGNOSIS — F319 Bipolar disorder, unspecified: Secondary | ICD-10-CM | POA: Diagnosis not present

## 2022-02-15 DIAGNOSIS — G251 Drug-induced tremor: Secondary | ICD-10-CM | POA: Diagnosis not present

## 2022-02-15 DIAGNOSIS — F3132 Bipolar disorder, current episode depressed, moderate: Secondary | ICD-10-CM

## 2022-02-15 DIAGNOSIS — Z79899 Other long term (current) drug therapy: Secondary | ICD-10-CM | POA: Diagnosis not present

## 2022-02-15 MED ORDER — LITHIUM CARBONATE 150 MG PO CAPS: ORAL_CAPSULE | ORAL | 1 refills | Status: DC

## 2022-02-15 NOTE — Patient Instructions (Addendum)
Reduce lithium bc tremor to 3 of the 150 mg capsules (or 1 of the 150mg  capsules and 1 of the 300 m,g capsules) at evening meal  if necessary for tremor after a week or so add vitamin B6 250 mg twice daily

## 2022-02-15 NOTE — Progress Notes (Signed)
Kaitlyn Lowe [id number] 03-04-1951 71 y.o.    Subjective:   Patient ID:  Kaitlyn Lowe is a 70 y.o. (DOB 1950/04/20) female.  Chief Complaint:  Chief Complaint  Patient presents with   Follow-up   Depression   Anxiety   Mood problem    HPI Kaitlyn Lowe presents to the office today for follow-up of bipolar disorder.  seen February 08, 2018 with no med changes.  She has had a period of several years of stability.  06/03/20 appt with following noted: Stable mood.  Concerns about memory in conversation.  Not forgetting bills. No SE with meds and no mood swings.   Some trouble getting to sleep.  No caffeine after 5 PM.  2 glasses of tea at lunch.  Sleep interrupted by nocturia 3-4 times and may take an hour or 2 to go to sleep.  Sleep problems for a year.  Not drowsy daytime.  No naps.  Reads until 11 and might get up 930 or 1030. Still itching and that contributes to insomnia also. Still doing well without problems except itching really only at night.  No rash.  No history of allergies.   Patient reports stable mood and denies depressed or irritable moods.  Patient denies any recent difficulty with anxiety.  Denies appetite disturbance.  Patient reports that energy and motivation have been good.  Patient denies any difficulty with concentration.  Patient denies any suicidal ideation. Plan: Continue lithium 600 mg, fluoxetine 10 mg, trazodone 100 mg nightly  08/24/2021 appointment with the following noted: Itching only at night mostly head and some in body. Unrlieved by hydroxyzine 25 mg A little more irritable and easier to anger in flashes with trigger. Tremor in L hand a problem carrying a glass of wine. Friend notices memory not as good.  No problems with leaving pots on stove but will forget purse.  Some longterm memory loss of events she thinks its excessive. TKR September 10 2020 A lot of nights sleep doesn't start until 3 but sleeps until 10. But goes to bed to read about 1030.    10/25/21 appt noted: Multiple phone calls since here. CO insomnia. Last night took clonazepam and slept welll for 5 hours at 1 mg and awoke.  No SE A month ago good friend died pancreatic CA but didn't let people know.  Unnerved me and started spiral of insomnia.  Still feels flat and no joy.  Even towards dog.Low motivation. Increased lamotrigine 50 mg and DT increase 100 mg soon and asks about SE. Continue lithium 600 mg, fluoxetine 10 mg No Trazodone Plan: DT forgetfulness check B12 and folate and TSH Continue lithium 600 HS  Increase lamotrigine to 100 mg as planned DC fluoxetine DT failure Add B complex DT low normal B12 Clonazepam for TR ins 1-2 mg HS  02/15/22 appt noted: She has questions about meds and some physical problems she takes. Saw neuro and DAT scan negative.   Tremor in left hand > R hand.  Friends notice word finding problems.  Balance off but is having foot surgery tomorrow for foot pain .  Needs it in both feet. Mood has been ok overall.  Maybe a little angrier than normal but frustrated with inability to do her favorite activities.  Foot pain is such she can hardly walk.   Takes Benadryl with clonazepam bc itching and sleeps well with combo. No family history of tremor.  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 Doxepin NR,  Lithium olanzapine Seroquel NR and SE Clonazepam 1 mg HS Lunesta 3  Hx Xanax euphoria and insomnia  Hx Elna Breslow MD Jefm Petty MD [facility]  Review of Systems:  Review of Systems  Constitutional:  Positive for fatigue.  Musculoskeletal:  Positive for arthralgias and gait problem.  Skin:  Negative for rash.       Itching at night  Neurological:  Positive for tremors. Negative for weakness.  Psychiatric/Behavioral:  Positive for dysphoric mood.     Medications: I have reviewed the patient's current medications.  Current Outpatient  Medications  Medication Sig Dispense Refill   alendronate (FOSAMAX) 70 MG tablet Take 70 mg by mouth once a week.     aspirin 325 MG tablet Take 325 mg by mouth daily.     azelastine (ASTELIN) 0.1 % nasal spray Place 1 spray into both nostrils daily as needed for rhinitis or allergies. Use in each nostril as directed     clonazePAM (KLONOPIN) 1 MG tablet TAKE 1 TO 2 TABLETS BY MOUTH AT NIGHT AS NEEDED FOR SEVERE INSOMNIA 60 tablet 1   Cyanocobalamin (VITAMIN B 12 PO) Take by mouth.     diclofenac Sodium (VOLTAREN ARTHRITIS PAIN) 1 % GEL Apply topically as needed.     diphenhydrAMINE (BENADRYL) 25 MG tablet Take 25 mg by mouth every 6 (six) hours as needed for itching.     docusate sodium (COLACE) 100 MG capsule Take 200 mg by mouth in the morning.     lamoTRIgine (LAMICTAL) 100 MG tablet TAKE 1 TABLET(100 MG) BY MOUTH DAILY 30 tablet 1   levothyroxine (SYNTHROID) 100 MCG tablet Take 100 mcg by mouth daily before breakfast.     meloxicam (MOBIC) 15 MG tablet Take 15 mg by mouth daily.     naproxen sodium (ANAPROX) 220 MG tablet Take 440 mg by mouth daily.     Omeprazole 20 MG TBEC Take 20 mg by mouth daily.     Polyethyl Glycol-Propyl Glycol (SYSTANE OP) Place 1 drop into both eyes in the morning.     lithium carbonate 150 MG capsule 3 at evening meal 90 capsule 1   No current facility-administered medications for this visit.    Medication Side Effects: None  Allergies:  Allergies  Allergen Reactions   Codeine Nausea Only    Past Medical History:  Diagnosis Date   Depression     Family History  Problem Relation Age of Onset   Arthritis Mother    Mental illness Mother    Hypertension Mother    Transient ischemic attack Mother    Alzheimer's disease Neg Hx    Parkinson's disease Neg Hx    Tremor Neg Hx     Social History   Socioeconomic History   Marital status: Divorced    Spouse name: Not on file   Number of children: Not on file   Years of education: Not on file    Highest education level: Not on file  Occupational History   Not on file  Tobacco Use   Smoking status: Never   Smokeless tobacco: Never  Vaping Use   Vaping Use: Never used  Substance and Sexual Activity   Alcohol use: Yes    Alcohol/week: 12.0 standard drinks of alcohol    Types: 6 Glasses of wine, 6 Shots of liquor per week    Comment: three times a week 2 drinks at a time.   Drug use: No   Sexual  activity: Not on file  Other Topics Concern   Not on file  Social History Narrative   Not on file   Social Determinants of Health   Financial Resource Strain: Not on file  Food Insecurity: Not on file  Transportation Needs: Not on file  Physical Activity: Not on file  Stress: Not on file  Social Connections: Not on file  Intimate Partner Violence: Not on file    Past Medical History, Surgical history, Social history, and Family history were reviewed and updated as appropriate.   Please see review of systems for further details on the patient's review from today.   Objective:   Physical Exam:  There were no vitals taken for this visit.  Physical Exam Neurological:     Mental Status: She is alert and oriented to person, place, and time.     Cranial Nerves: No dysarthria.  Psychiatric:        Attention and Perception: Attention and perception normal.        Mood and Affect: Mood is not anxious or depressed.        Speech: Speech normal. Speech is not rapid and pressured.        Behavior: Behavior is cooperative.        Thought Content: Thought content normal. Thought content is not paranoid or delusional. Thought content does not include homicidal or suicidal ideation. Thought content does not include suicidal plan.        Cognition and Memory: Cognition and memory normal.        Judgment: Judgment normal.     Comments: Insight intact No word finiding problems in the offic.e     Lab Review:     Component Value Date/Time   NA 140 08/27/2021 1011   K 4.3 08/27/2021  1011   CL 103 08/27/2021 1011   CO2 29 08/27/2021 1011   GLUCOSE 87 08/27/2021 1011   BUN 14 08/27/2021 1011   CREATININE 0.91 08/27/2021 1011   CALCIUM 10.5 (H) 08/27/2021 1011   PROT 6.9 10/17/2012 1537   ALBUMIN 4.1 10/17/2012 1537   AST 21 10/17/2012 1537   ALT 17 10/17/2012 1537   ALKPHOS 36 (L) 10/17/2012 1537   BILITOT 0.7 10/17/2012 1537   GFRNONAA >60 08/13/2020 1500   GFRAA >60 01/08/2019 1520       Component Value Date/Time   WBC 7.4 01/08/2019 1520   RBC 4.33 01/08/2019 1520   HGB 13.1 01/08/2019 1520   HCT 41.8 01/08/2019 1520   PLT 269 01/08/2019 1520   MCV 96.5 01/08/2019 1520   MCH 30.3 01/08/2019 1520   MCHC 31.3 01/08/2019 1520   RDW 12.6 01/08/2019 1520   LYMPHSABS 1.0 10/17/2012 1537   MONOABS 0.5 10/17/2012 1537   EOSABS 0.1 10/17/2012 1537   BASOSABS 0.0 10/17/2012 1537    Lithium Lvl  Date Value Ref Range Status  01/26/2022 0.8 0.6 - 1.2 mmol/L Final  01/26/22 lithium level 0.8 on 600 mg daily 08/27/21 lithium level 0.8 on 600 mg daily   TSH 3.2, normal folate and low normal B12   No results found for: "PHENYTOIN", "PHENOBARB", "VALPROATE", "CBMZ"   .res Assessment: Plan:    Kaitlyn Lowe was seen today for follow-up, depression, anxiety and mood problem.  Diagnoses and all orders for this visit:  Bipolar disorder with moderate depression (HCC)  Mild cognitive impairment  Insomnia due to mental condition  Lithium-induced tremor  Lithium use  Bipolar I disorder (HCC) -     lithium carbonate  150 MG capsule; 3 at evening meal   Hx Very stable bipolar with good response to LITHIUM .  Tremor worse lately  Disc itching and workup from PCP.  Disc use of antihistamines.   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  Discussed need for periodic lab monitoring to determine drug level and to assess for  potential adverse effects.  Counseled patient regarding signs and symptoms of lithium toxicity and advised that they notify office immediately or seek urgent medical attention if experiencing these signs and symptoms.  Patient advised to contact office with any questions or concerns.  Option propranolol 10-20 mg BID prn tremor.  Try  Creatinine was 0.82 in September, calcium 9.9 and overall BMP was unremarkable at that time with a normal CBC.  Lithium level May 12, 2018 was 0.6. 08/27/21 lithium level 0.8 on 600 mg daily  Due to problematic tremor we will reduce lithium to 450 mg every afternoon.  Discussed risk of worsening mood swings and to call if that occurs.  If the tremor does not improve then add B6 250 mg twice daily.  DT forgetfulness checked B12 and folate and TSH and had low normal B12  continue lamotrigine 100 mg as planned  continue B 12 or B complex DT low normal B12 Clonazepam for TR ins 2 mg HS  Counseled patient regarding potential benefits, risks, and side effects of Lamictal to include potential risk of Stevens-Johnson syndrome. Advised patient to stop taking Lamictal and contact office immediately if rash develops and to seek urgent medical attention if rash is severe and/or spreading quickly. Will start Lamictal 25 mg daily for 2 weeks, then increase to 50 mg daily for 2 weeks, then 100 mg daily  Consider Wellbutrin.  She'd rather wait and take less meds.   Disc SE  We discussed the short-term risks associated with benzodiazepines including sedation and increased fall risk among others.  Discussed long-term side effect risk including dependence, potential withdrawal symptoms, and the potential eventual dose-related risk of dementia.  But recent studies from 2020 dispute this association between benzodiazepines and dementia risk. Newer studies in 2020 do not support an association with dementia.  Fu 8 weeks  [name] Parents, MD, DFAPA'  Please see After Visit Summary  for patient specific instructions.    No future appointments.   No orders of the defined types were placed in this encounter.   -------------------------------

## 2022-02-16 DIAGNOSIS — G8918 Other acute postprocedural pain: Secondary | ICD-10-CM | POA: Diagnosis not present

## 2022-02-16 DIAGNOSIS — M2022 Hallux rigidus, left foot: Secondary | ICD-10-CM | POA: Diagnosis not present

## 2022-02-25 ENCOUNTER — Other Ambulatory Visit: Payer: Self-pay | Admitting: Psychiatry

## 2022-02-25 DIAGNOSIS — F319 Bipolar disorder, unspecified: Secondary | ICD-10-CM

## 2022-03-16 DIAGNOSIS — M25552 Pain in left hip: Secondary | ICD-10-CM | POA: Diagnosis not present

## 2022-03-18 ENCOUNTER — Ambulatory Visit: Payer: Medicare PPO | Admitting: Psychiatry

## 2022-03-19 ENCOUNTER — Emergency Department (HOSPITAL_BASED_OUTPATIENT_CLINIC_OR_DEPARTMENT_OTHER): Payer: Medicare PPO

## 2022-03-19 ENCOUNTER — Emergency Department (HOSPITAL_BASED_OUTPATIENT_CLINIC_OR_DEPARTMENT_OTHER)
Admission: EM | Admit: 2022-03-19 | Discharge: 2022-03-19 | Disposition: A | Discharge status: Home / Self Care | Payer: Medicare PPO | Attending: Emergency Medicine | Admitting: Emergency Medicine

## 2022-03-19 ENCOUNTER — Encounter (HOSPITAL_BASED_OUTPATIENT_CLINIC_OR_DEPARTMENT_OTHER): Payer: Self-pay | Admitting: Emergency Medicine

## 2022-03-19 ENCOUNTER — Other Ambulatory Visit: Payer: Self-pay

## 2022-03-19 DIAGNOSIS — R112 Nausea with vomiting, unspecified: Secondary | ICD-10-CM | POA: Insufficient documentation

## 2022-03-19 DIAGNOSIS — N281 Cyst of kidney, acquired: Secondary | ICD-10-CM | POA: Diagnosis not present

## 2022-03-19 DIAGNOSIS — Z7982 Long term (current) use of aspirin: Secondary | ICD-10-CM | POA: Insufficient documentation

## 2022-03-19 DIAGNOSIS — R1110 Vomiting, unspecified: Secondary | ICD-10-CM | POA: Diagnosis not present

## 2022-03-19 LAB — URINALYSIS, ROUTINE W REFLEX MICROSCOPIC
Bilirubin Urine: NEGATIVE
Glucose, UA: NEGATIVE mg/dL
Hgb urine dipstick: NEGATIVE
Ketones, ur: NEGATIVE mg/dL
Nitrite: NEGATIVE
Specific Gravity, Urine: 1.019 (ref 1.005–1.030)
pH: 7 (ref 5.0–8.0)

## 2022-03-19 LAB — CBC WITH DIFFERENTIAL/PLATELET
Abs Immature Granulocytes: 0.03 10*3/uL (ref 0.00–0.07)
Basophils Absolute: 0.1 10*3/uL (ref 0.0–0.1)
Basophils Relative: 1 %
Eosinophils Absolute: 0.2 10*3/uL (ref 0.0–0.5)
Eosinophils Relative: 2 %
HCT: 39.5 % (ref 36.0–46.0)
Hemoglobin: 12.6 g/dL (ref 12.0–15.0)
Immature Granulocytes: 0 %
Lymphocytes Relative: 19 %
Lymphs Abs: 1.4 10*3/uL (ref 0.7–4.0)
MCH: 30.7 pg (ref 26.0–34.0)
MCHC: 31.9 g/dL (ref 30.0–36.0)
MCV: 96.1 fL (ref 80.0–100.0)
Monocytes Absolute: 0.6 10*3/uL (ref 0.1–1.0)
Monocytes Relative: 9 %
Neutro Abs: 4.9 10*3/uL (ref 1.7–7.7)
Neutrophils Relative %: 69 %
Platelets: 355 10*3/uL (ref 150–400)
RBC: 4.11 MIL/uL (ref 3.87–5.11)
RDW: 13.7 % (ref 11.5–15.5)
WBC: 7.2 10*3/uL (ref 4.0–10.5)
nRBC: 0 % (ref 0.0–0.2)

## 2022-03-19 LAB — COMPREHENSIVE METABOLIC PANEL
ALT: 16 U/L (ref 0–44)
AST: 19 U/L (ref 15–41)
Albumin: 4.9 g/dL (ref 3.5–5.0)
Alkaline Phosphatase: 39 U/L (ref 38–126)
Anion gap: 12 (ref 5–15)
BUN: 17 mg/dL (ref 8–23)
CO2: 25 mmol/L (ref 22–32)
Calcium: 9.9 mg/dL (ref 8.9–10.3)
Chloride: 104 mmol/L (ref 98–111)
Creatinine, Ser: 0.89 mg/dL (ref 0.44–1.00)
GFR, Estimated: >60 mL/min (ref >60)
Glucose, Bld: 125 mg/dL — ABNORMAL HIGH (ref 70–99)
Potassium: 4 mmol/L (ref 3.5–5.1)
Sodium: 141 mmol/L (ref 135–145)
Total Bilirubin: 0.7 mg/dL (ref 0.3–1.2)
Total Protein: 7.3 g/dL (ref 6.5–8.1)

## 2022-03-19 MED ORDER — IOHEXOL 300 MG/ML  SOLN
100.0000 mL | Freq: Once | INTRAMUSCULAR | Status: AC | PRN
  Administered 2022-03-19: 100 mL via INTRAVENOUS

## 2022-03-19 MED ORDER — ONDANSETRON 8 MG PO TBDP: 8.0000 mg | ORAL_TABLET | Freq: Three times a day (TID) | ORAL | 0 refills | Status: DC | PRN

## 2022-03-19 MED ORDER — LACTATED RINGERS IV BOLUS
1500.0000 mL | Freq: Once | INTRAVENOUS | Status: AC
  Administered 2022-03-19: 1500 mL via INTRAVENOUS

## 2022-03-19 MED ORDER — ONDANSETRON HCL 4 MG/2ML IJ SOLN
4.0000 mg | Freq: Four times a day (QID) | INTRAMUSCULAR | Status: DC | PRN
  Administered 2022-03-19: 4 mg via INTRAVENOUS
  Filled 2022-03-19: qty 2

## 2022-03-19 NOTE — ED Triage Notes (Signed)
Pt here from home with c/o n/v ( 4 times last night ) last night at 9 pm , none since that time

## 2022-03-19 NOTE — Discharge Instructions (Signed)
We saw you in the ER for nausea, vomiting, abdominal pain. All the results in the ER are normal, labs and imaging. We are not sure what is causing your symptoms. We suspect gastroenteritis.   We recommend simple diet for 2 days, hydrate well. Please return to the ER if your symptoms worsen; you have increased pain, fevers, chills, inability to keep any medications down, bloody stools or vomiting.

## 2022-03-19 NOTE — ED Notes (Signed)
IV placed; blood sent to lab for holding pending MD orders

## 2022-03-19 NOTE — ED Provider Notes (Signed)
[facility] EMERGENCY DEPT Provider Note   CSN: [id number] Arrival date & time: 03/19/22  3329     History {Add pertinent medical, surgical, social history, OB history to HPI:1} Chief Complaint  Patient presents with  . Vomiting    Kaitlyn Lowe is a 71 y.o. female.  HPI    71 year old female with multiple abdominal surgical history, previous history of small obstruction comes in with chief complaint of nausea and vomiting.  Yesterday evening patient started having nausea and vomiting.  Her last bowel movement was either yesterday morning or day before.  She is not passing a lot of gas.  She denies any significant abdominal pain.  Her symptoms are consistent with previous small bowel obstruction, she did not want to wait for her symptoms to get worse therefore she came to the ER.  Patient states that the last 4 times she attempted to eat or drink something she has vomited.  Emesis has been mostly comprising of the p.o. intake.  Home Medications Prior to Admission medications   Medication Sig Start Date End Date Taking? Authorizing Provider  alendronate (FOSAMAX) 70 MG tablet Take 70 mg by mouth once a week. 12/21/21   [provider]  aspirin 325 MG tablet Take 325 mg by mouth daily.    [provider]  azelastine (ASTELIN) 0.1 % nasal spray Place 1 spray into both nostrils daily as needed for rhinitis or allergies. Use in each nostril as directed    [provider]  clonazePAM (KLONOPIN) 1 MG tablet TAKE 1 TO 2 TABLETS BY MOUTH AT NIGHT AS NEEDED FOR SEVERE INSOMNIA 02/08/22   Cottle, Steva Ready., MD  Cyanocobalamin (VITAMIN B 12 PO) Take by mouth.    [provider]  diclofenac Sodium (VOLTAREN ARTHRITIS PAIN) 1 % GEL Apply topically as needed.    [provider]  diphenhydrAMINE (BENADRYL) 25 MG tablet Take 25 mg by mouth every 6 (six) hours as needed for itching.    [provider]  docusate sodium (COLACE) 100 MG  capsule Take 200 mg by mouth in the morning.    [provider]  lamoTRIgine (LAMICTAL) 100 MG tablet TAKE 1 TABLET(100 MG) BY MOUTH DAILY 02/25/22   Cottle, Steva Ready., MD  levothyroxine (SYNTHROID) 100 MCG tablet Take 100 mcg by mouth daily before breakfast.    [provider]  lithium carbonate 150 MG capsule 3 at evening meal 02/15/22   Cottle, Steva Ready., MD  meloxicam (MOBIC) 15 MG tablet Take 15 mg by mouth daily.    [provider]  naproxen sodium (ANAPROX) 220 MG tablet Take 440 mg by mouth daily.    [provider]  Omeprazole 20 MG TBEC Take 20 mg by mouth daily.    [provider]  Polyethyl Glycol-Propyl Glycol (SYSTANE OP) Place 1 drop into both eyes in the morning.    [provider]      Allergies    Codeine    Review of Systems   Review of Systems  All other systems reviewed and are negative.   Physical Exam Updated Vital Signs BP (!) 176/88   Pulse 80   Temp 98.8 F (37.1 C)   Resp 18   Ht 5\' 2"  (1.575 m)   Wt 76.7 kg   SpO2 100%   BMI 30.91 kg/m  Physical Exam Vitals and nursing note reviewed.  Constitutional:      General: She is not in acute distress.  Appearance: She is well-developed.  HENT:     Head: Normocephalic and atraumatic.  Eyes:     Extraocular Movements: Extraocular movements intact.     Conjunctiva/sclera: Conjunctivae normal.  Cardiovascular:     Rate and Rhythm: Normal rate and regular rhythm.     Heart sounds: Normal heart sounds.  Pulmonary:     Effort: Pulmonary effort is normal. No respiratory distress.     Breath sounds: Normal breath sounds.  Abdominal:     General: Bowel sounds are normal.     Palpations: Abdomen is soft.     Tenderness: There is no abdominal tenderness.  Musculoskeletal:     Cervical back: Normal range of motion and neck supple.  Skin:    General: Skin is warm and dry.  Neurological:     Mental Status: She is alert and oriented to person, place,  and time.    ED Results / Procedures / Treatments   Labs (all labs ordered are listed, but only abnormal results are displayed) Labs Reviewed  CBC WITH DIFFERENTIAL/PLATELET  URINALYSIS, ROUTINE W REFLEX MICROSCOPIC  COMPREHENSIVE METABOLIC PANEL    EKG None  Radiology No results found.  Procedures Procedures  {Document cardiac monitor, telemetry assessment procedure when appropriate:1}  Medications Ordered in ED Medications  ondansetron (ZOFRAN) injection 4 mg (4 mg Intravenous Given 03/19/22 0907)  lactated ringers bolus 1,500 mL (1,500 mLs Intravenous New Bag/Given 03/19/22 0906)    ED Course/ Medical Decision Making/ A&P                           Medical Decision Making Amount and/or Complexity of Data Reviewed Labs: ordered. Radiology: ordered.  Risk Prescription drug management.  This patient presents to the ED with chief complaint(s) of nausea, vomiting with pertinent past medical history of multiple abdominal surgical history and at least 3 episodes of small bowel obstruction with similar presentation at the onset of the condition.The complaint involves an extensive differential diagnosis and also carries with it a high risk of complications and morbidity.    The differential diagnosis includes : Ileus, partial obstruction, early small bowel obstruction, gastroenteritis.  Patient indicates that she is feeling dehydrated, dizzy when she gets up.  She also likely is orthostatic.  The initial plan is to give her IV fluids, get basic labs and get a CT abdomen pelvis with contrast.   Additional history obtained: Records reviewed previous admission documents and previous CT scan from 2014 that confirmed a small bowel obstruction.  Subsequent CT scan have shown ventral hernia, it is possible that the hernia might be causing some of her symptoms 2.  Independent labs interpretation:  The following labs were independently interpreted: CBC, metabolic profile are overall  reassuring.  Independent visualization and interpretation of imaging: - I independently visualized the following imaging with scope of interpretation limited to determining acute life threatening conditions related to emergency care: CT scan of the abdomen, which revealed no evidence of small bowel obstruction, intra-abdominal infection  Treatment and Reassessment: Pt reassessed. Pt's VSS and WNL. Pt's cap refill < 3 seconds. Pt has been hydrated in the ER and now passed po challenge. We will discharge with antiemetic. Strict ER return precautions have been discussed and pt will return if he is unable to tolerate fluids and symptoms are getting worse.   Consultation: - Consulted or discussed management/test interpretation with external professional: ***  Consideration for admission or further workup:  Social Determinants of health:  Final Clinical Impression(s) / ED Diagnoses Final diagnoses:  None    Rx / DC Orders ED Discharge Orders     None

## 2022-03-19 NOTE — ED Notes (Signed)
Pt given gingerale and crackers for PO challenge

## 2022-03-22 DIAGNOSIS — Z4889 Encounter for other specified surgical aftercare: Secondary | ICD-10-CM | POA: Diagnosis not present

## 2022-03-22 DIAGNOSIS — M2022 Hallux rigidus, left foot: Secondary | ICD-10-CM | POA: Diagnosis not present

## 2022-03-31 ENCOUNTER — Telehealth: Payer: Self-pay | Admitting: Psychiatry

## 2022-03-31 DIAGNOSIS — M25552 Pain in left hip: Secondary | ICD-10-CM | POA: Diagnosis not present

## 2022-03-31 DIAGNOSIS — M6281 Muscle weakness (generalized): Secondary | ICD-10-CM | POA: Diagnosis not present

## 2022-03-31 NOTE — Telephone Encounter (Signed)
Patient lvm at 11:55 stating that her clonazapam is no longer works to help her sleep. She would like to try something else preferably before Christmas. Pls rtc [phone] Appt 1/22

## 2022-04-01 NOTE — Telephone Encounter (Signed)
LVM to rtc 

## 2022-04-02 ENCOUNTER — Other Ambulatory Visit: Payer: Self-pay

## 2022-04-02 MED ORDER — MIRTAZAPINE 15 MG PO TABS: 15.0000 mg | ORAL_TABLET | Freq: Every day | ORAL | 0 refills | Status: DC

## 2022-04-02 NOTE — Telephone Encounter (Signed)
LVM to rtc 

## 2022-04-02 NOTE — Telephone Encounter (Signed)
Pt stated she is taking klonopin 1 mg 2 tabs at bedtime and it does not help her fall asleep or stay asleep.She said she thinks it has ran it's course.She wants to try something else for sleep

## 2022-04-02 NOTE — Telephone Encounter (Signed)
She can try mirtazapine 15 mg tablets 1 nightly.  #30 no refills.  Please send in this prescription. Tell her she cannot abruptly stop clonazepam where she will have withdrawal.  If the mirtazapine helps she can reduce the clonazepam after Christmas from 2 to 1-1/2 tablets and then we will attempt to gradually wean it.

## 2022-04-02 NOTE — Telephone Encounter (Signed)
Rx sent, pt informed. 

## 2022-04-03 DIAGNOSIS — U071 COVID-19: Secondary | ICD-10-CM | POA: Diagnosis not present

## 2022-04-03 DIAGNOSIS — J029 Acute pharyngitis, unspecified: Secondary | ICD-10-CM | POA: Diagnosis not present

## 2022-04-03 DIAGNOSIS — R5383 Other fatigue: Secondary | ICD-10-CM | POA: Diagnosis not present

## 2022-04-03 DIAGNOSIS — R051 Acute cough: Secondary | ICD-10-CM | POA: Diagnosis not present

## 2022-04-03 DIAGNOSIS — J069 Acute upper respiratory infection, unspecified: Secondary | ICD-10-CM | POA: Diagnosis not present

## 2022-04-08 DIAGNOSIS — U071 COVID-19: Secondary | ICD-10-CM | POA: Diagnosis not present

## 2022-04-13 ENCOUNTER — Other Ambulatory Visit: Payer: Self-pay | Admitting: Psychiatry

## 2022-04-13 DIAGNOSIS — F319 Bipolar disorder, unspecified: Secondary | ICD-10-CM

## 2022-04-16 DIAGNOSIS — M2022 Hallux rigidus, left foot: Secondary | ICD-10-CM | POA: Diagnosis not present

## 2022-04-16 DIAGNOSIS — Z4889 Encounter for other specified surgical aftercare: Secondary | ICD-10-CM | POA: Diagnosis not present

## 2022-04-17 ENCOUNTER — Other Ambulatory Visit: Payer: Self-pay | Admitting: Psychiatry

## 2022-04-29 ENCOUNTER — Other Ambulatory Visit: Payer: Self-pay | Admitting: Psychiatry

## 2022-04-29 DIAGNOSIS — F319 Bipolar disorder, unspecified: Secondary | ICD-10-CM

## 2022-05-03 ENCOUNTER — Ambulatory Visit (INDEPENDENT_AMBULATORY_CARE_PROVIDER_SITE_OTHER): Payer: Medicare PPO | Admitting: Psychiatry

## 2022-05-03 ENCOUNTER — Encounter: Payer: Self-pay | Admitting: Psychiatry

## 2022-05-03 DIAGNOSIS — G3184 Mild cognitive impairment, so stated: Secondary | ICD-10-CM | POA: Diagnosis not present

## 2022-05-03 DIAGNOSIS — G251 Drug-induced tremor: Secondary | ICD-10-CM | POA: Diagnosis not present

## 2022-05-03 DIAGNOSIS — F319 Bipolar disorder, unspecified: Secondary | ICD-10-CM | POA: Diagnosis not present

## 2022-05-03 DIAGNOSIS — F3132 Bipolar disorder, current episode depressed, moderate: Secondary | ICD-10-CM | POA: Diagnosis not present

## 2022-05-03 DIAGNOSIS — Z79899 Other long term (current) drug therapy: Secondary | ICD-10-CM | POA: Diagnosis not present

## 2022-05-03 DIAGNOSIS — F5105 Insomnia due to other mental disorder: Secondary | ICD-10-CM | POA: Diagnosis not present

## 2022-05-03 MED ORDER — CLONAZEPAM 1 MG PO TABS: ORAL_TABLET | ORAL | 3 refills | Status: DC

## 2022-05-03 MED ORDER — LITHIUM CARBONATE 150 MG PO CAPS: ORAL_CAPSULE | ORAL | 1 refills | Status: DC

## 2022-05-03 MED ORDER — LAMOTRIGINE 100 MG PO TABS: 100.0000 mg | ORAL_TABLET | Freq: Every day | ORAL | 1 refills | Status: DC

## 2022-05-03 NOTE — Progress Notes (Signed)
Kaitlyn Lowe [id number] [birth year] 72 y.o.    Subjective:   Patient ID:  Kaitlyn Lowe is a 72 y.o. (DOB [birth year]) female.  Chief Complaint:  Chief Complaint  Patient presents with   Follow-up    Bipolar disorder with moderate depression ([place])   Depression   Medication Problem    HPI Kaitlyn Lowe presents to the office today for follow-up of bipolar disorder.  seen February 08, 2018 with no med changes.  She has had a period of several years of stability.  06/03/20 appt with following noted: Stable mood.  Concerns about memory in conversation.  Not forgetting bills. No SE with meds and no mood swings.   Some trouble getting to sleep.  No caffeine after 5 PM.  2 glasses of tea at lunch.  Sleep interrupted by nocturia 3-4 times and may take an hour or 2 to go to sleep.  Sleep problems for a year.  Not drowsy daytime.  No naps.  Reads until 11 and might get up 930 or 1030. Still itching and that contributes to insomnia also. Still doing well without problems except itching really only at night.  No rash.  No history of allergies.   Patient reports stable mood and denies depressed or irritable moods.  Patient denies any recent difficulty with anxiety.  Denies appetite disturbance.  Patient reports that energy and motivation have been good.  Patient denies any difficulty with concentration.  Patient denies any suicidal ideation. Plan: Continue lithium 600 mg, fluoxetine 10 mg, trazodone 100 mg nightly  08/24/2021 appointment with the following noted: Itching only at night mostly head and some in body. Unrlieved by hydroxyzine 25 mg A little more irritable and easier to anger in flashes with trigger. Tremor in L hand a problem carrying a glass of wine. Friend notices memory not as good.  No problems with leaving pots on stove but will forget purse.  Some longterm memory loss of events she thinks its excessive. TKR September 10 2020 A lot of nights sleep doesn't start until 3 but sleeps until 10.  But goes to bed to read about 1030.   10/25/21 appt noted: Multiple phone calls since here. CO insomnia. Last night took clonazepam and slept welll for 5 hours at 1 mg and awoke.  No SE A month ago good friend died pancreatic CA but didn't let people know.  Unnerved me and started spiral of insomnia.  Still feels flat and no joy.  Even towards dog.Low motivation. Increased lamotrigine 50 mg and DT increase 100 mg soon and asks about SE. Continue lithium 600 mg, fluoxetine 10 mg No Trazodone Plan: DT forgetfulness check B12 and folate and TSH Continue lithium 600 HS  Increase lamotrigine to 100 mg as planned DC fluoxetine DT failure Add B complex DT low normal B12 Clonazepam for TR ins 1-2 mg HS  02/15/22 appt noted: She has questions about meds and some physical problems she takes. Saw neuro and DAT scan negative.   Tremor in left hand > R hand.  Friends notice word finding problems.  Balance off but is having foot surgery tomorrow for foot pain .  Needs it in both feet. Mood has been ok overall.  Maybe a little angrier than normal but frustrated with inability to do her favorite activities.  Foot pain is such she can hardly walk.   Takes Benadryl with clonazepam bc itching and sleeps well with combo. No family history of tremor. Plan: 10/23 lithium level 0.8 on 600 mg daily  Due to problematic tremor we will reduce lithium to 450 mg every afternoon.  Discussed risk of worsening mood swings and to call if that occurs. If the tremor does not improve then add B6 250 mg twice daily. DT forgetfulness checked B12 and folate and TSH and had low normal B12 continue lamotrigine 100 mg as planned continue B 12 or B complex DT low normal B12 Clonazepam for TR ins 2 mg HS  05/03/22 appt noted: Less tremor with less lithium to 450 mg daily. At least 50% better Taking B12 and D3 Mood has been good.   Foot surgery and added meloxicam and tramadol.   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 Doxepin NR,  Lithium olanzapine Seroquel NR and SE Clonazepam 1 mg HS Lunesta 3  Hx Xanax euphoria and insomnia  Hx [name] MD [place]  Review of Systems:  Review of Systems  Constitutional:  Positive for fatigue.  Musculoskeletal:  Positive for arthralgias and gait problem.  Skin:  Negative for rash.       Itching at night  Neurological:  Positive for tremors. Negative for weakness.  Psychiatric/Behavioral:  Negative for dysphoric mood.     Medications: I have reviewed the patient's current medications.  Current Outpatient Medications  Medication Sig Dispense Refill   alendronate (FOSAMAX) 70 MG tablet Take 70 mg by mouth once a week.     aspirin 325 MG tablet Take 325 mg by mouth daily.     azelastine (ASTELIN) 0.1 % nasal spray Place 1 spray into both nostrils daily as needed for rhinitis or allergies. Use in each nostril as directed     calcium-vitamin D (OSCAL WITH D) 500-5 MG-MCG tablet Take 1 tablet by mouth.     Cyanocobalamin (VITAMIN B 12 PO) Take by mouth.     diclofenac Sodium (VOLTAREN ARTHRITIS PAIN) 1 % GEL Apply topically as needed.     diphenhydrAMINE (BENADRYL) 25 MG tablet Take 25 mg by mouth every 6 (six) hours as needed for itching.     docusate sodium (COLACE) 100 MG capsule Take 200 mg by mouth in the morning.     levothyroxine (SYNTHROID) 100 MCG tablet Take 100 mcg by mouth daily before breakfast.     meloxicam (MOBIC) 15 MG tablet Take 15 mg by mouth daily.     naproxen sodium (ANAPROX) 220 MG tablet Take 440 mg by mouth daily.     Omeprazole 20 MG TBEC Take 20 mg by mouth daily.     ondansetron (ZOFRAN-ODT) 8 MG disintegrating tablet Take 1 tablet (8 mg total) by mouth every 8 (eight) hours as needed for nausea. 20 tablet 0   Polyethyl Glycol-Propyl Glycol (SYSTANE OP) Place 1 drop into both eyes in the morning.     clonazePAM  (KLONOPIN) 1 MG tablet TAKE 1 TO 2 TABLETS BY MOUTH AT NIGHT AS NEEDED FOR SEVERE INSOMNIA 60 tablet 3   lamoTRIgine (LAMICTAL) 100 MG tablet Take 1 tablet (100 mg total) by mouth daily. 90 tablet 1   lithium carbonate 150 MG capsule TAKE 3 CAPSULES BY MOUTH EVENING MEAL 270 capsule 1   No current facility-administered medications for this visit.    Medication Side Effects: None  Allergies:  Allergies  Allergen Reactions   Codeine Nausea Only    Past Medical History:  Diagnosis Date   Depression     Family History  Problem Relation Age of  Onset   Arthritis Mother    Mental illness Mother    Hypertension Mother    Transient ischemic attack Mother    Alzheimer's disease Neg Hx    Parkinson's disease Neg Hx    Tremor Neg Hx     Social History   Socioeconomic History   Marital status: Divorced    Spouse name: Not on file   Number of children: Not on file   Years of education: Not on file   Highest education level: Not on file  Occupational History   Not on file  Tobacco Use   Smoking status: Never   Smokeless tobacco: Never  Vaping Use   Vaping Use: Never used  Substance and Sexual Activity   Alcohol use: Yes    Alcohol/week: 12.0 standard drinks of alcohol    Types: 6 Glasses of wine, 6 Shots of liquor per week    Comment: three times a week 2 drinks at a time.   Drug use: No   Sexual activity: Not on file  Other Topics Concern   Not on file  Social History Narrative   Not on file   Social Determinants of Health   Financial Resource Strain: Not on file  Food Insecurity: Not on file  Transportation Needs: Not on file  Physical Activity: Not on file  Stress: Not on file  Social Connections: Not on file  Intimate Partner Violence: Not on file    Past Medical History, Surgical history, Social history, and Family history were reviewed and updated as appropriate.   Please see review of systems for further details on the patient's review from today.    Objective:   Physical Exam:  There were no vitals taken for this visit.  Physical Exam Neurological:     Mental Status: She is alert and oriented to person, place, and time.     Cranial Nerves: No dysarthria.  Psychiatric:        Attention and Perception: Attention and perception normal.        Mood and Affect: Mood is not anxious or depressed.        Speech: Speech normal. Speech is not rapid and pressured.        Behavior: Behavior is cooperative.        Thought Content: Thought content normal. Thought content is not paranoid or delusional. Thought content does not include homicidal or suicidal ideation. Thought content does not include suicidal plan.        Cognition and Memory: Cognition and memory normal.        Judgment: Judgment normal.     Comments: Insight intact No word finiding problems in the office     Lab Review:     Component Value Date/Time   NA 141 03/19/2022 0850   K 4.0 03/19/2022 0850   CL 104 03/19/2022 0850   CO2 25 03/19/2022 0850   GLUCOSE 125 (H) 03/19/2022 0850   BUN 17 03/19/2022 0850   CREATININE 0.89 03/19/2022 0850   CREATININE 0.91 08/27/2021 1011   CALCIUM 9.9 03/19/2022 0850   PROT 7.3 03/19/2022 0850   ALBUMIN 4.9 03/19/2022 0850   AST 19 03/19/2022 0850   ALT 16 03/19/2022 0850   ALKPHOS 39 03/19/2022 0850   BILITOT 0.7 03/19/2022 0850   GFRNONAA >60 03/19/2022 0850   GFRAA >60 01/08/2019 1520       Component Value Date/Time   WBC 7.2 03/19/2022 0850   RBC 4.11 03/19/2022 0850   HGB 12.6 03/19/2022  0850   HCT 39.5 03/19/2022 0850   PLT 355 03/19/2022 0850   MCV 96.1 03/19/2022 0850   MCH 30.7 03/19/2022 0850   MCHC 31.9 03/19/2022 0850   RDW 13.7 03/19/2022 0850   LYMPHSABS 1.4 03/19/2022 0850   MONOABS 0.6 03/19/2022 0850   EOSABS 0.2 03/19/2022 0850   BASOSABS 0.1 03/19/2022 0850    Lithium Lvl  Date Value Ref Range Status  01/26/2022 0.8 0.6 - 1.2 mmol/L Final  01/26/22 lithium level 0.8 on 600 mg daily 08/27/21  lithium level 0.8 on 600 mg daily   TSH 3.2, normal folate and low normal B12   No results found for: "PHENYTOIN", "PHENOBARB", "VALPROATE", "CBMZ"   .res Assessment: Plan:    [place]abor was seen today for follow-up, depression and medication problem.  Diagnoses and all orders for this visit:  Bipolar disorder with moderate depression ([place])  Mild cognitive impairment  Lithium-induced tremor  Insomnia due to mental condition  Lithium use  Bipolar I disorder ([facility]) -     lamoTRIgine (LAMICTAL) 100 MG tablet; Take 1 tablet (100 mg total) by mouth daily. -     lithium carbonate 150 MG capsule; TAKE 3 CAPSULES BY MOUTH EVENING MEAL  Other orders -     clonazePAM (KLONOPIN) 1 MG tablet; TAKE 1 TO 2 TABLETS BY MOUTH AT NIGHT AS NEEDED FOR SEVERE INSOMNIA   Greater than 50% of 30 min face to face time with patient was spent on counseling and coordination of care. We discussed Hx Very stable bipolar with good response to LITHIUM .  Tremor better with less lithium.  Disc itching and workup from PCP.  Disc use of antihistamines.   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  Discussed need for periodic lab monitoring to determine drug level and to assess for potential adverse effects.  Counseled patient regarding signs and symptoms of lithium toxicity and advised that they notify office immediately or seek urgent medical attention if experiencing these signs and symptoms.  Patient advised to contact office with any questions or concerns.  Option propranolol 10-20 mg BID prn tremor.  Try  Creatinine was 0.82 in September, calcium 9.9 and overall BMP was unremarkable at that time with a normal CBC.  Lithium level May 12, 2018 was 0.6. 08/27/21 lithium level 0.8 on 600 mg daily  01/26/22 lithium level 0.8 on 600 mg daily  Tremor is 50% better with  reduction lithium from 600 to 450 mg daily. Trial B6 (pyridoxine) 250 mg tablets 1 daily for 1 week then 1 twice daily for lithium tremor  DT forgetfulness checked B12 and folate and TSH and had low normal B12 continue B 12 or B complex DT low normal B12  continue lamotrigine 100 mg as planned  Clonazepam for TR ins 1.5 mg HS  Counseled patient regarding potential benefits, risks, and side effects of Lamictal to include potential risk of Stevens-Johnson syndrome. Advised patient to stop taking Lamictal and contact office immediately if rash develops and to seek urgent medical attention if rash is severe and/or spreading quickly.   We discussed the short-term risks associated with benzodiazepines including sedation and increased fall risk among others.  Discussed long-term side effect risk including dependence, potential withdrawal symptoms, and the potential eventual dose-related risk of dementia.  But recent studies from 2020 dispute this association between benzodiazepines and dementia risk. Newer studies in 2020  do not support an association with dementia.  Fu 12 weeks  [name] Parents, MD, DFAPA'  Please see After Visit Summary for patient specific instructions.    No future appointments.   No orders of the defined types were placed in this encounter.   -------------------------------

## 2022-05-03 NOTE — Patient Instructions (Addendum)
Trial B6 (pyridoxine) 250 mg tablets 1 daily for 1 week then 1 twice daily for lithium tremor

## 2022-05-08 ENCOUNTER — Other Ambulatory Visit: Payer: Self-pay | Admitting: Psychiatry

## 2022-05-10 ENCOUNTER — Other Ambulatory Visit: Payer: Self-pay | Admitting: Psychiatry

## 2022-05-10 ENCOUNTER — Other Ambulatory Visit: Payer: Self-pay

## 2022-05-10 MED ORDER — MIRTAZAPINE 7.5 MG PO TABS: 7.5000 mg | ORAL_TABLET | Freq: Every day | ORAL | 1 refills | Status: DC

## 2022-05-10 NOTE — Telephone Encounter (Signed)
Pt LVM reporting mix up with her meds. Mirtazapine Rx not sent in. Contact Pt @ [phone]

## 2022-05-10 NOTE — Telephone Encounter (Signed)
Noted last visit that patient was not taking the medication and it was discontinued. LVM to [facility].

## 2022-05-10 NOTE — Telephone Encounter (Signed)
Pt LVM @ 1/28 2:16p.  She said the Mirtazapine s working fine, better than she expected after she started it over.  She would like a refill sent in to [facility] on [address].  No upcoming appt scheduled

## 2022-05-28 ENCOUNTER — Telehealth: Payer: Self-pay | Admitting: Psychiatry

## 2022-05-28 DIAGNOSIS — S92315A Nondisplaced fracture of first metatarsal bone, left foot, initial encounter for closed fracture: Secondary | ICD-10-CM | POA: Diagnosis not present

## 2022-05-28 DIAGNOSIS — Z4889 Encounter for other specified surgical aftercare: Secondary | ICD-10-CM | POA: Diagnosis not present

## 2022-05-28 DIAGNOSIS — M2022 Hallux rigidus, left foot: Secondary | ICD-10-CM | POA: Diagnosis not present

## 2022-05-28 NOTE — Telephone Encounter (Signed)
Pt LVM @ 12:56p.  She said she cannot sleep.  She said the Mirtazapine and Clonazepam is not working.  No upcoming appts scheduled.

## 2022-05-31 NOTE — Telephone Encounter (Signed)
LVM to [facility] 

## 2022-05-31 NOTE — Telephone Encounter (Signed)
Ask if she is having any manic sx:  racing thoughts, mood swings, impulsivity, irritability, euphoria, agitation.  If so RX olanzapine.  It is more likely to help sleep than anything but more SE.   If not then we can try some other options that might hlep but less SE risk.

## 2022-05-31 NOTE — Telephone Encounter (Signed)
Patient c/o not being able to sleep. She takes two clonazepam and one 7.5 mg mirtazapine and is able to get to sleep with those, but said she wakes up between 1:30 and 2:30. She takes another 7.5 mg mirtazapine and it takes her about an hour to get back to sleep and then she wakes up at 6:30. I forgot to ask her what time she took her meds.   I reviewed sleep hygiene. No caffeine after lunch. She goes to bed at 10:00 and watches TV until 11:00 and then it is lights out.

## 2022-06-01 NOTE — Telephone Encounter (Signed)
Called twice and get message that the call cannot be completed at this time.

## 2022-06-02 ENCOUNTER — Other Ambulatory Visit: Payer: Self-pay | Admitting: Psychiatry

## 2022-06-02 NOTE — Telephone Encounter (Signed)
LVM for friend on contact list to ask patient to call us. I continue to get a message that call cannot be completed at this time.

## 2022-06-02 NOTE — Telephone Encounter (Signed)
Called patient twice today and I get the same message, that call cannot be completed at this time.

## 2022-06-02 NOTE — Telephone Encounter (Signed)
Tried patient again and was able to reach her VM. LM to have her call tomorrow.

## 2022-06-03 ENCOUNTER — Other Ambulatory Visit: Payer: Self-pay | Admitting: Psychiatry

## 2022-06-03 NOTE — Telephone Encounter (Signed)
She can take 2 mg clonazepam and the 2 mirtazapine.  Cannot go higher in either than that.

## 2022-06-03 NOTE — Telephone Encounter (Signed)
LVM to [facility] 

## 2022-06-03 NOTE — Telephone Encounter (Signed)
Left another VM to [facility].

## 2022-06-03 NOTE — Telephone Encounter (Signed)
Was finally able to reach patient. She denied any manic sx. 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 She said she took 2 clonazepam and 2 mirtazapine at the same time last night and slept well.

## 2022-06-08 DIAGNOSIS — R079 Chest pain, unspecified: Secondary | ICD-10-CM | POA: Diagnosis not present

## 2022-06-08 DIAGNOSIS — Z9889 Other specified postprocedural states: Secondary | ICD-10-CM | POA: Diagnosis not present

## 2022-06-08 DIAGNOSIS — F3181 Bipolar II disorder: Secondary | ICD-10-CM | POA: Diagnosis not present

## 2022-06-08 DIAGNOSIS — R030 Elevated blood-pressure reading, without diagnosis of hypertension: Secondary | ICD-10-CM | POA: Diagnosis not present

## 2022-06-08 NOTE — Telephone Encounter (Signed)
Patient notified

## 2022-06-17 ENCOUNTER — Telehealth: Payer: Self-pay | Admitting: Psychiatry

## 2022-06-17 MED ORDER — MIRTAZAPINE 15 MG PO TABS: 15.0000 mg | ORAL_TABLET | Freq: Every day | ORAL | 1 refills | Status: DC

## 2022-06-17 NOTE — Telephone Encounter (Signed)
Sent new Rx for 15 mg tablets.

## 2022-06-17 NOTE — Telephone Encounter (Signed)
Kaitlyn Lowe is asking for RF on Mirtazepine. She is now taking 2 per day and needs that updated.  Send to: [facility] [place], [place] AT [address] [place]

## 2022-06-25 DIAGNOSIS — M2022 Hallux rigidus, left foot: Secondary | ICD-10-CM | POA: Diagnosis not present

## 2022-06-25 DIAGNOSIS — S92315A Nondisplaced fracture of first metatarsal bone, left foot, initial encounter for closed fracture: Secondary | ICD-10-CM | POA: Diagnosis not present

## 2022-06-25 DIAGNOSIS — M7741 Metatarsalgia, right foot: Secondary | ICD-10-CM | POA: Diagnosis not present

## 2022-07-01 ENCOUNTER — Encounter: Payer: Self-pay | Admitting: Psychiatry

## 2022-07-01 ENCOUNTER — Ambulatory Visit (INDEPENDENT_AMBULATORY_CARE_PROVIDER_SITE_OTHER): Payer: Medicare PPO | Admitting: Psychiatry

## 2022-07-01 DIAGNOSIS — G251 Drug-induced tremor: Secondary | ICD-10-CM | POA: Diagnosis not present

## 2022-07-01 DIAGNOSIS — G3184 Mild cognitive impairment, so stated: Secondary | ICD-10-CM

## 2022-07-01 DIAGNOSIS — F3132 Bipolar disorder, current episode depressed, moderate: Secondary | ICD-10-CM | POA: Diagnosis not present

## 2022-07-01 DIAGNOSIS — F5105 Insomnia due to other mental disorder: Secondary | ICD-10-CM | POA: Diagnosis not present

## 2022-07-01 DIAGNOSIS — Z79899 Other long term (current) drug therapy: Secondary | ICD-10-CM

## 2022-07-01 NOTE — Progress Notes (Signed)
Kaitlyn Lowe [id number] [birth year] 72 y.o.    Subjective:   Patient ID:  Kaitlyn Lowe is a 72 y.o. (DOB 1951/02/20) female.  Chief Complaint:  Chief Complaint  Patient presents with   Follow-up   Depression   Anxiety   Stress   Sleeping Problem    HPI Kaitlyn Lowe presents to the office today for follow-up of bipolar disorder.  seen February 08, 2018 with no med changes.  She has had a period of several years of stability.  06/03/20 appt with following noted: Stable mood.  Concerns about memory in conversation.  Not forgetting bills. No SE with meds and no mood swings.   Some trouble getting to sleep.  No caffeine after 5 PM.  2 glasses of tea at lunch.  Sleep interrupted by nocturia 3-4 times and may take an hour or 2 to go to sleep.  Sleep problems for a year.  Not drowsy daytime.  No naps.  Reads until 11 and might get up 930 or 1030. Still itching and that contributes to insomnia also. Still doing well without problems except itching really only at night.  No rash.  No history of allergies.   Patient reports stable mood and denies depressed or irritable moods.  Patient denies any recent difficulty with anxiety.  Denies appetite disturbance.  Patient reports that energy and motivation have been good.  Patient denies any difficulty with concentration.  Patient denies any suicidal ideation. Plan: Continue lithium 600 mg, fluoxetine 10 mg, trazodone 100 mg nightly  08/24/2021 appointment with the following noted: Itching only at night mostly head and some in body. Unrlieved by hydroxyzine 25 mg A little more irritable and easier to anger in flashes with trigger. Tremor in L hand a problem carrying a glass of wine. Friend notices memory not as good.  No problems with leaving pots on stove but will forget purse.  Some longterm memory loss of events she thinks its excessive. TKR September 10 2020 A lot of nights sleep doesn't start until 3 but sleeps until 10. But goes to bed to read about  1030.   10/25/21 appt noted: Multiple phone calls since here. CO insomnia. Last night took clonazepam and slept welll for 5 hours at 1 mg and awoke.  No SE A month ago good friend died pancreatic CA but didn't let people know.  Unnerved me and started spiral of insomnia.  Still feels flat and no joy.  Even towards dog.Low motivation. Increased lamotrigine 50 mg and DT increase 100 mg soon and asks about SE. Continue lithium 600 mg, fluoxetine 10 mg No Trazodone Plan: DT forgetfulness check B12 and folate and TSH Continue lithium 600 HS  Increase lamotrigine to 100 mg as planned DC fluoxetine DT failure Add B complex DT low normal B12 Clonazepam for TR ins 1-2 mg HS  02/15/22 appt noted: She has questions about meds and some physical problems she takes. Saw neuro and DAT scan negative.   Tremor in left hand > R hand.  Friends notice word finding problems.  Balance off but is having foot surgery tomorrow for foot pain .  Needs it in both feet. Mood has been ok overall.  Maybe a little angrier than normal but frustrated with inability to do her favorite activities.  Foot pain is such she can hardly walk.   Takes Benadryl with clonazepam bc itching and sleeps well with combo. No family history of tremor. Plan: 10/23 lithium level 0.8 on 600 mg daily  Due to  problematic tremor we will reduce lithium to 450 mg every afternoon.  Discussed risk of worsening mood swings and to call if that occurs. If the tremor does not improve then add B6 250 mg twice daily. DT forgetfulness checked B12 and folate and TSH and had low normal B12 continue lamotrigine 100 mg as planned continue B 12 or B complex DT low normal B12 Clonazepam for TR ins 2 mg HS  05/03/22 appt noted: Less tremor with less lithium to 450 mg daily. At least 50% better Taking B12 and D3 Mood has been good.   Foot surgery and added meloxicam and tramadol.  07/01/22 appt noted: Still trouble sleeping.  On clonazepam 2 mg HS and  mirtazapine and feels woozy but can't fall asleep for a couple of hours.  About 6 hours of sleep. Takes 2 hours to fall asleep. Very irritable.  Little things set me off.  Dropping things. Friend left her in a situation owing $90K.   Now trying to sell the RV.  Very stressful. Not helping attitude or sleep. Put on wt bc can't be active.   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 Doxepin NR,  Lithium olanzapine Seroquel NR and SE Clonazepam 1 mg HS Lunesta 3  Hx Xanax euphoria and insomnia  Hx [name] MD [place]  Review of Systems:  Review of Systems  Constitutional:  Positive for fatigue.  Musculoskeletal:  Positive for arthralgias, gait problem and joint swelling.  Skin:  Negative for rash.       Itching at night  Neurological:  Positive for tremors. Negative for weakness.  Psychiatric/Behavioral:  Negative for dysphoric mood.     Medications: I have reviewed the patient's current medications.  Current Outpatient Medications  Medication Sig Dispense Refill   alendronate (FOSAMAX) 70 MG tablet Take 70 mg by mouth once a week.     azelastine (ASTELIN) 0.1 % nasal spray Place 1 spray into both nostrils daily as needed for rhinitis or allergies. Use in each nostril as directed     calcium-vitamin D (OSCAL WITH D) 500-5 MG-MCG tablet Take 1 tablet by mouth.     clonazePAM (KLONOPIN) 1 MG tablet TAKE 1 TO 2 TABLETS BY MOUTH AT NIGHT AS NEEDED FOR SEVERE INSOMNIA 60 tablet 3   Cyanocobalamin (VITAMIN B 12 PO) Take by mouth.     docusate sodium (COLACE) 100 MG capsule Take 200 mg by mouth in the morning.     lamoTRIgine (LAMICTAL) 100 MG tablet Take 1 tablet (100 mg total) by mouth daily. 90 tablet 1   levothyroxine (SYNTHROID) 100 MCG tablet Take 100 mcg by mouth daily before breakfast.     lithium carbonate 150 MG capsule TAKE 3 CAPSULES BY MOUTH EVENING MEAL 270 capsule 1    meloxicam (MOBIC) 15 MG tablet Take 15 mg by mouth daily.     mirtazapine (REMERON) 15 MG tablet Take 1 tablet (15 mg total) by mouth at bedtime. 30 tablet 1   Omeprazole 20 MG TBEC Take 20 mg by mouth daily.     Polyethyl Glycol-Propyl Glycol (SYSTANE OP) Place 1 drop into both eyes in the morning.     traMADol (ULTRAM) 50 MG tablet Take 50 mg by mouth every 6 (six) hours as needed.     diclofenac Sodium (VOLTAREN ARTHRITIS PAIN) 1 % GEL Apply topically as needed. (Patient not taking: Reported on 07/01/2022)     diphenhydrAMINE (  BENADRYL) 25 MG tablet Take 25 mg by mouth every 6 (six) hours as needed for itching. (Patient not taking: Reported on 07/01/2022)     No current facility-administered medications for this visit.    Medication Side Effects: None  Allergies:  Allergies  Allergen Reactions   Codeine Nausea Only    Past Medical History:  Diagnosis Date   Depression     Family History  Problem Relation Age of Onset   Arthritis Mother    Mental illness Mother    Hypertension Mother    Transient ischemic attack Mother    Alzheimer's disease Neg Hx    Parkinson's disease Neg Hx    Tremor Neg Hx     Social History   Socioeconomic History   Marital status: Divorced    Spouse name: Not on file   Number of children: Not on file   Years of education: Not on file   Highest education level: Not on file  Occupational History   Not on file  Tobacco Use   Smoking status: Never   Smokeless tobacco: Never  Vaping Use   Vaping Use: Never used  Substance and Sexual Activity   Alcohol use: Yes    Alcohol/week: 12.0 standard drinks of alcohol    Types: 6 Glasses of wine, 6 Shots of liquor per week    Comment: three times a week 2 drinks at a time.   Drug use: No   Sexual activity: Not on file  Other Topics Concern   Not on file  Social History Narrative   Not on file   Social Determinants of Health   Financial Resource Strain: Not on file  Food Insecurity: Not on  file  Transportation Needs: Not on file  Physical Activity: Not on file  Stress: Not on file  Social Connections: Not on file  Intimate Partner Violence: Not on file    Past Medical History, Surgical history, Social history, and Family history were reviewed and updated as appropriate.   Please see review of systems for further details on the patient's review from today.   Objective:   Physical Exam:  There were no vitals taken for this visit.  Physical Exam Constitutional:      General: She is not in acute distress. Musculoskeletal:        General: No deformity.  Neurological:     Mental Status: She is alert and oriented to person, place, and time.     Cranial Nerves: No dysarthria.     Coordination: Coordination normal.  Psychiatric:        Attention and Perception: Attention and perception normal. She does not perceive auditory or visual hallucinations.        Mood and Affect: Mood normal. Mood is not anxious or depressed. Affect is not labile, blunt, angry or inappropriate.        Speech: Speech normal. Speech is not rapid and pressured.        Behavior: Behavior normal. Behavior is cooperative.        Thought Content: Thought content normal. Thought content is not paranoid or delusional. Thought content does not include homicidal or suicidal ideation. Thought content does not include suicidal plan.        Cognition and Memory: Cognition and memory normal.        Judgment: Judgment normal.     Comments: Insight intact No word finiding problems in the office     Lab Review:     Component Value Date/Time  NA 141 03/19/2022 0850   K 4.0 03/19/2022 0850   CL 104 03/19/2022 0850   CO2 25 03/19/2022 0850   GLUCOSE 125 (H) 03/19/2022 0850   BUN 17 03/19/2022 0850   CREATININE 0.89 03/19/2022 0850   CREATININE 0.91 08/27/2021 1011   CALCIUM 9.9 03/19/2022 0850   PROT 7.3 03/19/2022 0850   ALBUMIN 4.9 03/19/2022 0850   AST 19 03/19/2022 0850   ALT 16 03/19/2022 0850    ALKPHOS 39 03/19/2022 0850   BILITOT 0.7 03/19/2022 0850   GFRNONAA >60 03/19/2022 0850   GFRAA >60 01/08/2019 1520       Component Value Date/Time   WBC 7.2 03/19/2022 0850   RBC 4.11 03/19/2022 0850   HGB 12.6 03/19/2022 0850   HCT 39.5 03/19/2022 0850   PLT 355 03/19/2022 0850   MCV 96.1 03/19/2022 0850   MCH 30.7 03/19/2022 0850   MCHC 31.9 03/19/2022 0850   RDW 13.7 03/19/2022 0850   LYMPHSABS 1.4 03/19/2022 0850   MONOABS 0.6 03/19/2022 0850   EOSABS 0.2 03/19/2022 0850   BASOSABS 0.1 03/19/2022 0850    Lithium Lvl  Date Value Ref Range Status  01/26/2022 0.8 0.6 - 1.2 mmol/L Final  01/26/22 lithium level 0.8 on 600 mg daily 08/27/21 lithium level 0.8 on 600 mg daily   TSH 3.2, normal folate and low normal B12   No results found for: "PHENYTOIN", "PHENOBARB", "VALPROATE", "CBMZ"   .res Assessment: Plan:    Ah was seen today for follow-up, depression, anxiety, stress and sleeping problem.  Diagnoses and all orders for this visit:  Bipolar disorder with moderate depression ([place])  Mild cognitive impairment  Lithium-induced tremor  Insomnia due to mental condition  Lithium use   Greater than 50% of 30 min face to face time with patient was spent on counseling and coordination of care. We discussed Hx Very stable bipolar with good response to LITHIUM .  Tremor better with less lithium.  Disc itching and workup from PCP.  Disc use of antihistamines.   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  Discussed need for periodic lab monitoring to determine drug level and to assess for potential adverse effects.  Counseled patient regarding signs and symptoms of lithium toxicity and advised that they notify office immediately or seek urgent medical attention if experiencing these signs and symptoms.  Patient advised to  contact office with any questions or concerns.  Option propranolol 10-20 mg BID prn tremor.  Try  Creatinine was 0.82 in September, calcium 9.9 and overall BMP was unremarkable at that time with a normal CBC.  Lithium level May 12, 2018 was 0.6. 08/27/21 lithium level 0.8 on 600 mg daily  01/26/22 lithium level 0.8 on 600 mg daily  Tremor is 50% better with reduction lithium from 600 to 450 mg daily. Trial B6 (pyridoxine) 250 mg tablets 1 daily for 1 week then 1 twice daily for lithium tremor  DT forgetfulness checked B12 and folate and TSH and had low normal B12 continue B 12 or B complex DT low normal B12  continue lamotrigine 100 mg as planned  Clonazepam for TR ins 2 mg HS  Increase Mirtazapine 2 of 15 mg at night for sleep If it doesn't help call back and we'll retry trazodone100 mg nightly which worked for [employer] in the past.  Counseled patient regarding potential benefits, risks, and side  effects of Lamictal to include potential risk of Stevens-Johnson syndrome. Advised patient to stop taking Lamictal and contact office immediately if rash develops and to seek urgent medical attention if rash is severe and/or spreading quickly.   We discussed the short-term risks associated with benzodiazepines including sedation and increased fall risk among others.  Discussed long-term side effect risk including dependence, potential withdrawal symptoms, and the potential eventual dose-related risk of dementia.  But recent studies from 2020 dispute this association between benzodiazepines and dementia risk. Newer studies in 2020 do not support an association with dementia.  Fu 12 weeks  [name] Parents, MD, DFAPA'  Please see After Visit Summary for patient specific instructions.    Future Appointments  Date Time Provider [place]  07/21/2022  2:00 PM Cottle, Billey Co., MD [facility] None     No orders of the defined types were placed in this  encounter.   -------------------------------

## 2022-07-01 NOTE — Patient Instructions (Signed)
Increase Mirtazapine 2 of 15 mg at night for sleep If it doesn't help call back and we'll retry trazodone100 mg nightly which worked for [employer] in the past.

## 2022-07-13 DIAGNOSIS — H2513 Age-related nuclear cataract, bilateral: Secondary | ICD-10-CM | POA: Diagnosis not present

## 2022-07-15 ENCOUNTER — Telehealth: Payer: Self-pay | Admitting: Psychiatry

## 2022-07-15 ENCOUNTER — Other Ambulatory Visit: Payer: Self-pay | Admitting: Psychiatry

## 2022-07-15 MED ORDER — MIRTAZAPINE 30 MG PO TABS: 30.0000 mg | ORAL_TABLET | Freq: Every day | ORAL | 0 refills | Status: DC

## 2022-07-15 NOTE — Telephone Encounter (Signed)
Pt said at her last visit she could take two of the mirtazapine  15 mgat night . Please resubmit new script

## 2022-07-15 NOTE — Telephone Encounter (Signed)
Let her know for insurance reasons, I need to send in mirtazapine 30 mg 1 at night instead of 2 of the 15 mg tablets.

## 2022-07-16 NOTE — Telephone Encounter (Signed)
Patient notified

## 2022-07-21 ENCOUNTER — Ambulatory Visit: Payer: Medicare PPO | Admitting: Psychiatry

## 2022-07-23 DIAGNOSIS — S92312D Displaced fracture of first metatarsal bone, left foot, subsequent encounter for fracture with routine healing: Secondary | ICD-10-CM | POA: Diagnosis not present

## 2022-07-23 DIAGNOSIS — M7741 Metatarsalgia, right foot: Secondary | ICD-10-CM | POA: Diagnosis not present

## 2022-07-23 DIAGNOSIS — M2022 Hallux rigidus, left foot: Secondary | ICD-10-CM | POA: Diagnosis not present

## 2022-08-06 ENCOUNTER — Other Ambulatory Visit: Payer: Self-pay | Admitting: Psychiatry

## 2022-08-31 ENCOUNTER — Ambulatory Visit (INDEPENDENT_AMBULATORY_CARE_PROVIDER_SITE_OTHER): Payer: Medicare PPO | Admitting: Psychiatry

## 2022-08-31 ENCOUNTER — Encounter: Payer: Self-pay | Admitting: Psychiatry

## 2022-08-31 DIAGNOSIS — Z79899 Other long term (current) drug therapy: Secondary | ICD-10-CM

## 2022-08-31 DIAGNOSIS — F3132 Bipolar disorder, current episode depressed, moderate: Secondary | ICD-10-CM | POA: Diagnosis not present

## 2022-08-31 DIAGNOSIS — F319 Bipolar disorder, unspecified: Secondary | ICD-10-CM

## 2022-08-31 DIAGNOSIS — G251 Drug-induced tremor: Secondary | ICD-10-CM | POA: Diagnosis not present

## 2022-08-31 DIAGNOSIS — F5105 Insomnia due to other mental disorder: Secondary | ICD-10-CM

## 2022-08-31 DIAGNOSIS — G3184 Mild cognitive impairment, so stated: Secondary | ICD-10-CM | POA: Diagnosis not present

## 2022-08-31 MED ORDER — LITHIUM CARBONATE 150 MG PO CAPS: ORAL_CAPSULE | ORAL | 1 refills | Status: DC

## 2022-08-31 MED ORDER — CLONAZEPAM 1 MG PO TABS: ORAL_TABLET | ORAL | 5 refills | Status: DC

## 2022-08-31 MED ORDER — LAMOTRIGINE 100 MG PO TABS: 100.0000 mg | ORAL_TABLET | Freq: Every day | ORAL | 1 refills | Status: DC

## 2022-08-31 NOTE — Progress Notes (Signed)
Kaitlyn Lowe [id number] June 04, 1950 72 y.o.    Subjective:   Patient ID:  Kaitlyn Lowe is a 72 y.o. (DOB Jul 04, 1950) female.  Chief Complaint:  Chief Complaint  Patient presents with   Follow-up   Sleeping Problem    HPI Kaitlyn Lowe presents to the office today for follow-up of bipolar disorder.  seen February 08, 2018 with no med changes.  She has had a period of several years of stability.  06/03/20 appt with following noted: Stable mood.  Concerns about memory in conversation.  Not forgetting bills. No SE with meds and no mood swings.   Some trouble getting to sleep.  No caffeine after 5 PM.  2 glasses of tea at lunch.  Sleep interrupted by nocturia 3-4 times and may take an hour or 2 to go to sleep.  Sleep problems for a year.  Not drowsy daytime.  No naps.  Reads until 11 and might get up 930 or 1030. Still itching and that contributes to insomnia also. Still doing well without problems except itching really only at night.  No rash.  No history of allergies.   Patient reports stable mood and denies depressed or irritable moods.  Patient denies any recent difficulty with anxiety.  Denies appetite disturbance.  Patient reports that energy and motivation have been good.  Patient denies any difficulty with concentration.  Patient denies any suicidal ideation. Plan: Continue lithium 600 mg, fluoxetine 10 mg, trazodone 100 mg nightly  08/24/2021 appointment with the following noted: Itching only at night mostly head and some in body. Unrlieved by hydroxyzine 25 mg A little more irritable and easier to anger in flashes with trigger. Tremor in L hand a problem carrying a glass of wine. Friend notices memory not as good.  No problems with leaving pots on stove but will forget purse.  Some longterm memory loss of events she thinks its excessive. TKR September 10 2020 A lot of nights sleep doesn't start until 3 but sleeps until 10. But goes to bed to read about 1030.   10/25/21 appt  noted: Multiple phone calls since here. CO insomnia. Last night took clonazepam and slept welll for 5 hours at 1 mg and awoke.  No SE A month ago good friend died pancreatic CA but didn't let people know.  Unnerved me and started spiral of insomnia.  Still feels flat and no joy.  Even towards dog.Low motivation. Increased lamotrigine 50 mg and DT increase 100 mg soon and asks about SE. Continue lithium 600 mg, fluoxetine 10 mg No Trazodone Plan: DT forgetfulness check B12 and folate and TSH Continue lithium 600 HS  Increase lamotrigine to 100 mg as planned DC fluoxetine DT failure Add B complex DT low normal B12 Clonazepam for TR ins 1-2 mg HS  02/15/22 appt noted: She has questions about meds and some physical problems she takes. Saw neuro and DAT scan negative.   Tremor in left hand > R hand.  Friends notice word finding problems.  Balance off but is having foot surgery tomorrow for foot pain .  Needs it in both feet. Mood has been ok overall.  Maybe a little angrier than normal but frustrated with inability to do her favorite activities.  Foot pain is such she can hardly walk.   Takes Benadryl with clonazepam bc itching and sleeps well with combo. No family history of tremor. Plan: 10/23 lithium level 0.8 on 600 mg daily  Due to problematic tremor we will reduce lithium to 450 mg  every afternoon.  Discussed risk of worsening mood swings and to call if that occurs. If the tremor does not improve then add B6 250 mg twice daily. DT forgetfulness checked B12 and folate and TSH and had low normal B12 continue lamotrigine 100 mg as planned continue B 12 or B complex DT low normal B12 Clonazepam for TR ins 2 mg HS  05/03/22 appt noted: Less tremor with less lithium to 450 mg daily. At least 50% better Taking B12 and D3 Mood has been good.   Foot surgery and added meloxicam and tramadol.  07/01/22 appt noted: Still trouble sleeping.  On clonazepam 2 mg HS and mirtazapine and feels woozy  but can't fall asleep for a couple of hours.  About 6 hours of sleep. Takes 2 hours to fall asleep. Very irritable.  Little things set me off.  Dropping things. Friend left her in a situation owing $90K.   Now trying to sell the RV.  Very stressful. Not helping attitude or sleep. Put on wt bc can't be active. Plan: Increase Mirtazapine 2 of 15 mg at night for sleep If it doesn't help call back and we'll retry trazodone100 mg nightly which worked for Kaitlyn Lowe in the past. B6 trial for tremor  08/31/22 appt noted: Meds: clonazepam 2 mg HS with mirtazapine 30 HS , lamotrigine 100 mg HS, lithium 450 HS Tramadol 50 mg HS helps sleep  Has pain particularly at night.   Mood has been ok but can get irritable at little things. Still got lost $10K on the RV.  Bothered friend turned against her over the RV and lost several friends.  Got smaller RV and joined [employer] and enjoyed it.  Nice people.     Past Psychiatric Medication Trials: Hydroxyzine NR for itching at 50 mg HS Trazodone 100 worked Publishing copy. Doxepin NR,  Lithium olanzapine Seroquel NR and SE Clonazepam 1 mg HS Lunesta 3  Hx Xanax euphoria and insomnia  Hx Kaitlyn Breslow MD Kaitlyn Petty MD [facility]  Review of Systems:  Review of Systems  Constitutional:  Positive for fatigue.  Musculoskeletal:  Positive for arthralgias, gait problem and joint swelling.  Skin:  Negative for rash.       Itching at night  Neurological:  Positive for tremors. Negative for weakness.  Psychiatric/Behavioral:  Positive for sleep disturbance. Negative for dysphoric mood.     Medications: I have reviewed the patient's current medications.  Current Outpatient Medications  Medication Sig Dispense Refill   alendronate (FOSAMAX) 70 MG tablet Take 70 mg by mouth once a week.     azelastine (ASTELIN) 0.1 % nasal spray Place 1 spray into both nostrils daily as needed for rhinitis or allergies. Use in each nostril as directed      calcium-vitamin D (OSCAL WITH D) 500-5 MG-MCG tablet Take 1 tablet by mouth.     Cyanocobalamin (VITAMIN B 12 PO) Take by mouth.     diclofenac Sodium (VOLTAREN ARTHRITIS PAIN) 1 % GEL Apply topically as needed.     diphenhydrAMINE (BENADRYL) 25 MG tablet Take 25 mg by mouth every 6 (six) hours as needed for itching.     docusate sodium (COLACE) 100 MG capsule Take 200 mg by mouth in the morning.     levothyroxine (SYNTHROID) 100 MCG tablet Take 100 mcg by mouth daily before breakfast.     meloxicam (MOBIC) 15 MG tablet Take 15 mg by mouth daily.     Omeprazole 20 MG TBEC Take 20  mg by mouth daily.     Polyethyl Glycol-Propyl Glycol (SYSTANE OP) Place 1 drop into both eyes in the morning.     traMADol (ULTRAM) 50 MG tablet Take 50 mg by mouth every 6 (six) hours as needed.     clonazePAM (KLONOPIN) 1 MG tablet TAKE 1 TO 2 TABLETS BY MOUTH AT NIGHT AS NEEDED FOR SEVERE INSOMNIA 60 tablet 5   lamoTRIgine (LAMICTAL) 100 MG tablet Take 1 tablet (100 mg total) by mouth daily. 90 tablet 1   lithium carbonate 150 MG capsule TAKE 3 CAPSULES BY MOUTH EVENING MEAL 270 capsule 1   No current facility-administered medications for this visit.    Medication Side Effects: None  Allergies:  Allergies  Allergen Reactions   Codeine Nausea Only    Past Medical History:  Diagnosis Date   Depression     Family History  Problem Relation Age of Onset   Arthritis Mother    Mental illness Mother    Hypertension Mother    Transient ischemic attack Mother    Alzheimer's disease Neg Hx    Parkinson's disease Neg Hx    Tremor Neg Hx     Social History   Socioeconomic History   Marital status: Divorced    Spouse name: Not on file   Number of children: Not on file   Years of education: Not on file   Highest education level: Not on file  Occupational History   Not on file  Tobacco Use   Smoking status: Never   Smokeless tobacco: Never  Vaping Use   Vaping Use: Never used  Substance and Sexual  Activity   Alcohol use: Yes    Alcohol/week: 12.0 standard drinks of alcohol    Types: 6 Glasses of wine, 6 Shots of liquor per week    Comment: three times a week 2 drinks at a time.   Drug use: No   Sexual activity: Not on file  Other Topics Concern   Not on file  Social History Narrative   Not on file   Social Determinants of Health   Financial Resource Strain: Not on file  Food Insecurity: Not on file  Transportation Needs: Not on file  Physical Activity: Not on file  Stress: Not on file  Social Connections: Not on file  Intimate Partner Violence: Not on file    Past Medical History, Surgical history, Social history, and Family history were reviewed and updated as appropriate.   Please see review of systems for further details on the patient's review from today.   Objective:   Physical Exam:  There were no vitals taken for this visit.  Physical Exam Constitutional:      General: She is not in acute distress. Musculoskeletal:        General: No deformity.  Neurological:     Mental Status: She is alert and oriented to person, place, and time.     Cranial Nerves: No dysarthria.     Coordination: Coordination normal.  Psychiatric:        Attention and Perception: Attention and perception normal. She does not perceive auditory or visual hallucinations.        Mood and Affect: Mood normal. Mood is not anxious or depressed. Affect is not labile, angry or inappropriate.        Speech: Speech normal. Speech is not rapid and pressured.        Behavior: Behavior normal. Behavior is cooperative.        Thought Content:  Thought content normal. Thought content is not paranoid or delusional. Thought content does not include homicidal or suicidal ideation. Thought content does not include suicidal plan.        Cognition and Memory: Cognition and memory normal.        Judgment: Judgment normal.     Comments: Insight intact No word finiding problems in the office     Lab  Review:     Component Value Date/Time   NA 141 03/19/2022 0850   K 4.0 03/19/2022 0850   CL 104 03/19/2022 0850   CO2 25 03/19/2022 0850   GLUCOSE 125 (H) 03/19/2022 0850   BUN 17 03/19/2022 0850   CREATININE 0.89 03/19/2022 0850   CREATININE 0.91 08/27/2021 1011   CALCIUM 9.9 03/19/2022 0850   PROT 7.3 03/19/2022 0850   ALBUMIN 4.9 03/19/2022 0850   AST 19 03/19/2022 0850   ALT 16 03/19/2022 0850   ALKPHOS 39 03/19/2022 0850   BILITOT 0.7 03/19/2022 0850   GFRNONAA >60 03/19/2022 0850   GFRAA >60 01/08/2019 1520       Component Value Date/Time   WBC 7.2 03/19/2022 0850   RBC 4.11 03/19/2022 0850   HGB 12.6 03/19/2022 0850   HCT 39.5 03/19/2022 0850   PLT 355 03/19/2022 0850   MCV 96.1 03/19/2022 0850   MCH 30.7 03/19/2022 0850   MCHC 31.9 03/19/2022 0850   RDW 13.7 03/19/2022 0850   LYMPHSABS 1.4 03/19/2022 0850   MONOABS 0.6 03/19/2022 0850   EOSABS 0.2 03/19/2022 0850   BASOSABS 0.1 03/19/2022 0850    Lithium Lvl  Date Value Ref Range Status  01/26/2022 0.8 0.6 - 1.2 mmol/L Final  01/26/22 lithium level 0.8 on 600 mg daily 08/27/21 lithium level 0.8 on 600 mg daily   TSH 3.2, normal folate and low normal B12   No results found for: "PHENYTOIN", "PHENOBARB", "VALPROATE", "CBMZ"   .res Assessment: Plan:    Liv was seen today for follow-up and sleeping problem.  Diagnoses and all orders for this visit:  Bipolar disorder with moderate depression (HCC)  Mild cognitive impairment  Lithium-induced tremor  Insomnia due to mental condition -     clonazePAM (KLONOPIN) 1 MG tablet; TAKE 1 TO 2 TABLETS BY MOUTH AT NIGHT AS NEEDED FOR SEVERE INSOMNIA  Lithium use  Bipolar I disorder (HCC) -     lamoTRIgine (LAMICTAL) 100 MG tablet; Take 1 tablet (100 mg total) by mouth daily. -     lithium carbonate 150 MG capsule; TAKE 3 CAPSULES BY MOUTH EVENING MEAL   30 min face to face time with patient was spent on counseling and coordination of care. We discussed  Hx Very stable bipolar with good response to LITHIUM .  Tremor better with less lithium.  Counseled patient regarding potential benefits, risks, and side effects of lithium to include potential risk of lithium affecting thyroid and renal function.  Discussed need for periodic lab monitoring to determine drug level and to assess for potential adverse effects.  Counseled patient regarding signs and symptoms of lithium toxicity and advised that they notify office immediately or seek urgent medical attention if experiencing these signs and symptoms.  Patient advised to contact office with any questions or concerns.  Option propranolol 10-20 mg BID prn tremor.    Creatinine was 0.82 in September, calcium 9.9 and overall BMP was unremarkable at that time with a normal CBC.  Lithium level May 12, 2018 was 0.6. 08/27/21 lithium level 0.8 on 600 mg daily  01/26/22 lithium level 0.8 on 600 mg daily  Tremor is 50% better with reduction lithium from 600 to 450 mg daily. Trial B6 (pyridoxine) 250 mg tablets 1 daily for 1 week then 1 twice daily for lithium tremor  DT forgetfulness checked B12 and folate and TSH and had low normal B12 continue B 12 or B complex DT low normal B12  continue lamotrigine 100 mg as planned  Clonazepam for TR ins 2 mg HS,  Sleep well so far using tramadol  50 mg HS. If it fails consider chlorpromazine.    DC mirtazapine DT NR  Counseled patient regarding potential benefits, risks, and side effects of Lamictal to include potential risk of Stevens-Johnson syndrome. Advised patient to stop taking Lamictal and contact office immediately if rash develops and to seek urgent medical attention if rash is severe and/or spreading quickly.   We discussed the short-term risks associated with benzodiazepines including sedation and increased fall risk among others.  Discussed long-term side effect risk including dependence, potential withdrawal symptoms, and the potential eventual  dose-related risk of dementia.  But recent studies from 2020 dispute this association between benzodiazepines and dementia risk. Newer studies in 2020 do not support an association with dementia.  Fu 12 weeks  Meredith Staggers, MD, DFAPA'  Please see After Visit Summary for patient specific instructions.    No future appointments.    No orders of the defined types were placed in this encounter.   -------------------------------

## 2022-09-01 DIAGNOSIS — M2041 Other hammer toe(s) (acquired), right foot: Secondary | ICD-10-CM | POA: Diagnosis not present

## 2022-09-01 DIAGNOSIS — S92312D Displaced fracture of first metatarsal bone, left foot, subsequent encounter for fracture with routine healing: Secondary | ICD-10-CM | POA: Diagnosis not present

## 2022-09-22 ENCOUNTER — Other Ambulatory Visit: Payer: Self-pay | Admitting: Psychiatry

## 2022-09-22 NOTE — Telephone Encounter (Signed)
DC mirtazapine

## 2022-09-27 DIAGNOSIS — E039 Hypothyroidism, unspecified: Secondary | ICD-10-CM | POA: Diagnosis not present

## 2022-09-27 DIAGNOSIS — R251 Tremor, unspecified: Secondary | ICD-10-CM | POA: Diagnosis not present

## 2022-09-27 DIAGNOSIS — F3181 Bipolar II disorder: Secondary | ICD-10-CM | POA: Diagnosis not present

## 2022-09-27 DIAGNOSIS — Z79899 Other long term (current) drug therapy: Secondary | ICD-10-CM | POA: Diagnosis not present

## 2022-09-27 DIAGNOSIS — Z23 Encounter for immunization: Secondary | ICD-10-CM | POA: Diagnosis not present

## 2022-09-27 DIAGNOSIS — I700 Atherosclerosis of aorta: Secondary | ICD-10-CM | POA: Diagnosis not present

## 2022-09-27 DIAGNOSIS — E782 Mixed hyperlipidemia: Secondary | ICD-10-CM | POA: Diagnosis not present

## 2022-09-27 DIAGNOSIS — Z0000 Encounter for general adult medical examination without abnormal findings: Secondary | ICD-10-CM | POA: Diagnosis not present

## 2022-09-27 DIAGNOSIS — G2119 Other drug induced secondary parkinsonism: Secondary | ICD-10-CM | POA: Diagnosis not present

## 2022-09-28 DIAGNOSIS — E782 Mixed hyperlipidemia: Secondary | ICD-10-CM | POA: Diagnosis not present

## 2022-09-28 DIAGNOSIS — Z79899 Other long term (current) drug therapy: Secondary | ICD-10-CM | POA: Diagnosis not present

## 2022-10-05 DIAGNOSIS — Z1231 Encounter for screening mammogram for malignant neoplasm of breast: Secondary | ICD-10-CM | POA: Diagnosis not present

## 2022-10-11 DIAGNOSIS — R944 Abnormal results of kidney function studies: Secondary | ICD-10-CM | POA: Diagnosis not present

## 2022-11-01 DIAGNOSIS — M7741 Metatarsalgia, right foot: Secondary | ICD-10-CM | POA: Diagnosis not present

## 2022-11-01 DIAGNOSIS — M2022 Hallux rigidus, left foot: Secondary | ICD-10-CM | POA: Diagnosis not present

## 2022-11-26 DIAGNOSIS — M2022 Hallux rigidus, left foot: Secondary | ICD-10-CM | POA: Diagnosis not present

## 2022-11-26 DIAGNOSIS — M7741 Metatarsalgia, right foot: Secondary | ICD-10-CM | POA: Diagnosis not present

## 2022-12-02 DIAGNOSIS — M7742 Metatarsalgia, left foot: Secondary | ICD-10-CM | POA: Diagnosis not present

## 2022-12-02 DIAGNOSIS — M2022 Hallux rigidus, left foot: Secondary | ICD-10-CM | POA: Diagnosis not present

## 2022-12-02 DIAGNOSIS — M7741 Metatarsalgia, right foot: Secondary | ICD-10-CM | POA: Diagnosis not present

## 2022-12-03 ENCOUNTER — Telehealth: Payer: Self-pay | Admitting: Psychiatry

## 2022-12-03 MED ORDER — MIRTAZAPINE 30 MG PO TABS: 15.0000 mg | ORAL_TABLET | Freq: Every day | ORAL | 0 refills | Status: DC

## 2022-12-03 NOTE — Telephone Encounter (Addendum)
Patient c/o not sleeping thru the night and asking for Rx for 30 mg mirtazapine. It was discontinued in May d/t being ineffective. She said she takes 2 mg clonazepam at night and gets to sleep, but waking up around 3-4 AM. She had some mirtazapine left and has been taking that when she wakes up and is able to go back to sleep.    Note from 5/21:  Clonazepam for TR ins 2 mg HS,  Sleep well so far using tramadol  50 mg HS. If it fails consider chlorpromazine.    Will send Rx if ok.   Also taking: Lamotrigine 100 mg Lithium 450 mg

## 2022-12-03 NOTE — Telephone Encounter (Signed)
Patient notified of recommendations. Rx sent to requested pharmacy.

## 2022-12-03 NOTE — Telephone Encounter (Signed)
Patient lvm stating she was previously on Mirtazapine. She would like to add this back to her medication regimen to assist with her current sleep medication.  Appointment 03/03/23 Contact # [phone]

## 2022-12-03 NOTE — Telephone Encounter (Signed)
Ok to resume mirtazapine 30 mg to use at beginning of night or upon awakening like she is doing.  Remind her sometimes 1/2 tablet mirtazapine works better than 1 for sleep and feel free to try both doses.

## 2022-12-20 DIAGNOSIS — M545 Low back pain, unspecified: Secondary | ICD-10-CM | POA: Diagnosis not present

## 2022-12-24 DIAGNOSIS — M545 Low back pain, unspecified: Secondary | ICD-10-CM | POA: Diagnosis not present

## 2022-12-28 DIAGNOSIS — M5451 Vertebrogenic low back pain: Secondary | ICD-10-CM | POA: Diagnosis not present

## 2022-12-30 ENCOUNTER — Other Ambulatory Visit: Payer: Self-pay | Admitting: Psychiatry

## 2023-01-10 ENCOUNTER — Telehealth: Payer: Self-pay | Admitting: Psychiatry

## 2023-01-10 MED ORDER — MIRTAZAPINE 45 MG PO TABS: 45.0000 mg | ORAL_TABLET | Freq: Every day | ORAL | 0 refills | Status: DC

## 2023-01-10 NOTE — Addendum Note (Signed)
Addended by: Karin Lieu T on: 01/10/2023 05:11 PM   Modules accepted: Orders

## 2023-01-10 NOTE — Telephone Encounter (Signed)
Patient notified of Rx and she knows to just take 1 tablet as this is a dose increase.

## 2023-01-10 NOTE — Telephone Encounter (Signed)
Pt called to request that we send in Mirtazepine 30mg   differently. She says that she is taking 1 & 1/2 tabs per night to help with her sleep.

## 2023-01-10 NOTE — Telephone Encounter (Signed)
Ok on the dose change but tell her we should send in the RX for 45 mg tab, 1 at night, #90, no refill if that is what she plans to take.

## 2023-01-10 NOTE — Telephone Encounter (Signed)
Per 8/23 TC:  Ok to resume mirtazapine 30 mg to use at beginning of night or upon awakening like she is doing.  Remind her sometimes 1/2 tablet mirtazapine works better than 1 for sleep and feel free to try both doses.   Patient called to ask for a RF and said she is taking 1-1/2 tabs (45 mg) nightly. I can send Rx, but wanted to verify that 45 mg dosing is okay.

## 2023-01-25 DIAGNOSIS — M5451 Vertebrogenic low back pain: Secondary | ICD-10-CM | POA: Diagnosis not present

## 2023-01-31 ENCOUNTER — Telehealth: Payer: Self-pay

## 2023-01-31 NOTE — Telephone Encounter (Signed)
Prior Authorization submitted and approved for Lithium carbonate 150 mg capsules PA Case: [id number], 04/12/2022 - 04/11/2024 with [employer].

## 2023-02-15 ENCOUNTER — Telehealth: Payer: Self-pay | Admitting: Psychiatry

## 2023-02-15 DIAGNOSIS — M7742 Metatarsalgia, left foot: Secondary | ICD-10-CM | POA: Diagnosis not present

## 2023-02-15 DIAGNOSIS — M2022 Hallux rigidus, left foot: Secondary | ICD-10-CM | POA: Diagnosis not present

## 2023-02-15 DIAGNOSIS — M7741 Metatarsalgia, right foot: Secondary | ICD-10-CM | POA: Diagnosis not present

## 2023-02-15 NOTE — Telephone Encounter (Signed)
Patient called sttating that her pharmacy called her to let her know her Clonazepam 1mg  was ready. Once she got to pharmacy she was informed that it was to early and she needed her provider to call them to approve picking up today. She will be leaving tomorrow for a week and needs sent to [facility] Dr Jacky Kindle Ph: [phone] Appt 11/21

## 2023-02-16 NOTE — Telephone Encounter (Signed)
Pharmacy said they would fill today. Talked to patient and she was out of town, will get when she returns.

## 2023-02-23 ENCOUNTER — Other Ambulatory Visit: Payer: Self-pay | Admitting: Psychiatry

## 2023-02-23 DIAGNOSIS — F319 Bipolar disorder, unspecified: Secondary | ICD-10-CM

## 2023-03-03 ENCOUNTER — Telehealth: Payer: Self-pay | Admitting: Psychiatry

## 2023-03-03 ENCOUNTER — Ambulatory Visit (INDEPENDENT_AMBULATORY_CARE_PROVIDER_SITE_OTHER): Payer: Medicare PPO | Admitting: Psychiatry

## 2023-03-03 ENCOUNTER — Encounter: Payer: Self-pay | Admitting: Psychiatry

## 2023-03-03 ENCOUNTER — Ambulatory Visit: Payer: Medicare PPO | Admitting: Psychiatry

## 2023-03-03 DIAGNOSIS — F3132 Bipolar disorder, current episode depressed, moderate: Secondary | ICD-10-CM | POA: Diagnosis not present

## 2023-03-03 DIAGNOSIS — F319 Bipolar disorder, unspecified: Secondary | ICD-10-CM

## 2023-03-03 DIAGNOSIS — F5105 Insomnia due to other mental disorder: Secondary | ICD-10-CM

## 2023-03-03 DIAGNOSIS — Z79899 Other long term (current) drug therapy: Secondary | ICD-10-CM | POA: Diagnosis not present

## 2023-03-03 DIAGNOSIS — G251 Drug-induced tremor: Secondary | ICD-10-CM

## 2023-03-03 DIAGNOSIS — G3184 Mild cognitive impairment, so stated: Secondary | ICD-10-CM

## 2023-03-03 MED ORDER — DIAZEPAM 10 MG PO TABS: 20.0000 mg | ORAL_TABLET | Freq: Every evening | ORAL | 0 refills | Status: DC

## 2023-03-03 MED ORDER — LITHIUM CARBONATE 150 MG PO CAPS: ORAL_CAPSULE | ORAL | 1 refills | Status: DC

## 2023-03-03 MED ORDER — LAMOTRIGINE 100 MG PO TABS: 100.0000 mg | ORAL_TABLET | Freq: Every day | ORAL | 1 refills | Status: DC

## 2023-03-03 NOTE — Progress Notes (Signed)
Kaitlyn Lowe [id number] 1951-03-02 72 y.o.    Subjective:   Patient ID:  Kaitlyn Lowe is a 72 y.o. (DOB 1950/05/05) female.  Chief Complaint:  Chief Complaint  Patient presents with   Follow-up    mood   Sleeping Problem    HPI Kaitlyn Lowe presents to the office today for follow-up of bipolar disorder.  seen February 08, 2018 with no med changes.  She has had a period of several years of stability.  06/03/20 appt with following noted: Stable mood.  Concerns about memory in conversation.  Not forgetting bills. No SE with meds and no mood swings.   Some trouble getting to sleep.  No caffeine after 5 PM.  2 glasses of tea at lunch.  Sleep interrupted by nocturia 3-4 times and may take an hour or 2 to go to sleep.  Sleep problems for a year.  Not drowsy daytime.  No naps.  Reads until 11 and might get up 930 or 1030. Still itching and that contributes to insomnia also. Still doing well without problems except itching really only at night.  No rash.  No history of allergies.   Patient reports stable mood and denies depressed or irritable moods.  Patient denies any recent difficulty with anxiety.  Denies appetite disturbance.  Patient reports that energy and motivation have been good.  Patient denies any difficulty with concentration.  Patient denies any suicidal ideation. Plan: Continue lithium 600 mg, fluoxetine 10 mg, trazodone 100 mg nightly  08/24/2021 appointment with the following noted: Itching only at night mostly head and some in body. Unrlieved by hydroxyzine 25 mg A little more irritable and easier to anger in flashes with trigger. Tremor in L hand a problem carrying a glass of wine. Friend notices memory not as good.  No problems with leaving pots on stove but will forget purse.  Some longterm memory loss of events she thinks its excessive. TKR September 10 2020 A lot of nights sleep doesn't start until 3 but sleeps until 10. But goes to bed to read about 1030.   10/25/21 appt  noted: Multiple phone calls since here. CO insomnia. Last night took clonazepam and slept welll for 5 hours at 1 mg and awoke.  No SE A month ago good friend died pancreatic CA but didn't let people know.  Unnerved me and started spiral of insomnia.  Still feels flat and no joy.  Even towards dog.Low motivation. Increased lamotrigine 50 mg and DT increase 100 mg soon and asks about SE. Continue lithium 600 mg, fluoxetine 10 mg No Trazodone Plan: DT forgetfulness check B12 and folate and TSH Continue lithium 600 HS  Increase lamotrigine to 100 mg as planned DC fluoxetine DT failure Add B complex DT low normal B12 Clonazepam for TR ins 1-2 mg HS  02/15/22 appt noted: She has questions about meds and some physical problems she takes. Saw neuro and DAT scan negative.   Tremor in left hand > R hand.  Friends notice word finding problems.  Balance off but is having foot surgery tomorrow for foot pain .  Needs it in both feet. Mood has been ok overall.  Maybe a little angrier than normal but frustrated with inability to do her favorite activities.  Foot pain is such she can hardly walk.   Takes Benadryl with clonazepam bc itching and sleeps well with combo. No family history of tremor. Plan: 10/23 lithium level 0.8 on 600 mg daily  Due to problematic tremor we will reduce  lithium to 450 mg every afternoon.  Discussed risk of worsening mood swings and to call if that occurs. If the tremor does not improve then add B6 250 mg twice daily. DT forgetfulness checked B12 and folate and TSH and had low normal B12 continue lamotrigine 100 mg as planned continue B 12 or B complex DT low normal B12 Clonazepam for TR ins 2 mg HS  05/03/22 appt noted: Less tremor with less lithium to 450 mg daily. At least 50% better Taking B12 and D3 Mood has been good.   Foot surgery and added meloxicam and tramadol.  07/01/22 appt noted: Still trouble sleeping.  On clonazepam 2 mg HS and mirtazapine and feels woozy  but can't fall asleep for a couple of hours.  About 6 hours of sleep. Takes 2 hours to fall asleep. Very irritable.  Little things set me off.  Dropping things. Friend left her in a situation owing $90K.   Now trying to sell the RV.  Very stressful. Not helping attitude or sleep. Put on wt bc can't be active. Plan: Increase Mirtazapine 2 of 15 mg at night for sleep If it doesn't help call back and we'll retry trazodone100 mg nightly which worked for [employer] in the past. B6 trial for tremor  08/31/22 appt noted: Meds: clonazepam 2 mg HS with mirtazapine 30 HS , lamotrigine 100 mg HS, lithium 450 HS Tramadol 50 mg HS helps sleep  Has pain particularly at night.   Mood has been ok but can get irritable at little things. Still got lost $10K on the RV.  Bothered friend turned against her over the RV and lost several friends.  Got smaller RV and joined [employer] and enjoyed it.  Nice people.   Plan: DC mirtazapine DT NR  12/03/22 TC: Patient c/o not sleeping thru the night and asking for Rx for 30 mg mirtazapine. It was discontinued in May d/t being ineffective. She said she takes 2 mg clonazepam at night and gets to sleep, but waking up around 3-4 AM. She had some mirtazapine left and has been taking that when she wakes up and is able to go back to sleep.   MD resp:  Ok to resume mirtazapine 30 mg to use at beginning of night or upon awakening like she is doing.  Remind her sometimes 1/2 tablet mirtazapine works better than 1 for sleep and feel free to try both doses.    01/10/23 TC asking to increase mirtazapine 45 HS  03/03/23 appt noted: Psych med: mirtazapine 45, clonazepam 2 mg HS, lithium 450 HS, lamotrigine 100.   Awakens 230 AM and takes tylenol PM and back to sleep in 30 mins.   Asks about it affecting to memory.  Takes 1 and 1/2 hour to go to sleep initially   CAFFEINE, NONE AFTER 2.  Tea with lunch.  Decaf coffee in AM.   Short tempered with election, on outs with brother, as  noted swindled out of money.  Problems with B started in summer.   Can start thinking of those things at night.  Wonderful dog.  Reads at night.   No major mood swing.s CC sleep  Past Psychiatric Medication Trials: Hydroxyzine NR for itching at 50 mg HS Trazodone 100 worked Publishing copy. Doxepin NR,  Mirtazapine for sleep variable. Lithium olanzapine Seroquel NR and SE Clonazepam 2 mg HS, lost response Lunesta 3  Hx Xanax euphoria and insomnia  Hx Elna Breslow MD Jefm Petty MD [facility]  Review of Systems:  Review of Systems  Constitutional:  Positive for fatigue.  Musculoskeletal:  Positive for arthralgias, gait problem and joint swelling.  Skin:  Negative for rash.       Itching at night  Neurological:  Positive for tremors. Negative for weakness.  Psychiatric/Behavioral:  Positive for sleep disturbance. Negative for dysphoric mood.     Medications: I have reviewed the patient's current medications.  Current Outpatient Medications  Medication Sig Dispense Refill   alendronate (FOSAMAX) 70 MG tablet Take 70 mg by mouth once a week.     azelastine (ASTELIN) 0.1 % nasal spray Place 1 spray into both nostrils daily as needed for rhinitis or allergies. Use in each nostril as directed     calcium-vitamin D (OSCAL WITH D) 500-5 MG-MCG tablet Take 1 tablet by mouth.     clonazePAM (KLONOPIN) 1 MG tablet TAKE 1 TO 2 TABLETS BY MOUTH AT NIGHT AS NEEDED FOR SEVERE INSOMNIA 60 tablet 5   Cyanocobalamin (VITAMIN B 12 PO) Take by mouth.     diclofenac Sodium (VOLTAREN ARTHRITIS PAIN) 1 % GEL Apply topically as needed.     diphenhydrAMINE (BENADRYL) 25 MG tablet Take 25 mg by mouth every 6 (six) hours as needed for itching.     docusate sodium (COLACE) 100 MG capsule Take 200 mg by mouth in the morning.     lamoTRIgine (LAMICTAL) 100 MG tablet TAKE 1 TABLET(100 MG) BY MOUTH DAILY 90 tablet 0   levothyroxine (SYNTHROID) 100 MCG tablet Take 100 mcg by mouth daily before  breakfast.     lithium carbonate 150 MG capsule TAKE 3 CAPSULES BY MOUTH EVENING MEAL 270 capsule 1   meloxicam (MOBIC) 15 MG tablet Take 15 mg by mouth daily.     mirtazapine (REMERON) 45 MG tablet Take 1 tablet (45 mg total) by mouth at bedtime. 90 tablet 0   Omeprazole 20 MG TBEC Take 20 mg by mouth daily.     Polyethyl Glycol-Propyl Glycol (SYSTANE OP) Place 1 drop into both eyes in the morning.     traMADol (ULTRAM) 50 MG tablet Take 50 mg by mouth every 6 (six) hours as needed. (Patient not taking: Reported on 03/03/2023)     No current facility-administered medications for this visit.    Medication Side Effects: None  Allergies:  Allergies  Allergen Reactions   Codeine Nausea Only    Past Medical History:  Diagnosis Date   Depression     Family History  Problem Relation Age of Onset   Arthritis Mother    Mental illness Mother    Hypertension Mother    Transient ischemic attack Mother    Alzheimer's disease Neg Hx    Parkinson's disease Neg Hx    Tremor Neg Hx     Social History   Socioeconomic History   Marital status: Divorced    Spouse name: Not on file   Number of children: Not on file   Years of education: Not on file   Highest education level: Not on file  Occupational History   Not on file  Tobacco Use   Smoking status: Never   Smokeless tobacco: Never  Vaping Use   Vaping status: Never Used  Substance and Sexual Activity   Alcohol use: Yes    Alcohol/week: 12.0 standard drinks of alcohol    Types: 6 Glasses of wine, 6 Shots of liquor per week    Comment: three times a week 2 drinks at a time.   Drug  use: No   Sexual activity: Not on file  Other Topics Concern   Not on file  Social History Narrative   Not on file   Social Determinants of Health   Financial Resource Strain: Not on file  Food Insecurity: Not on file  Transportation Needs: Not on file  Physical Activity: Not on file  Stress: Not on file  Social Connections: Unknown  (08/24/2021)   [facility] from [facility], [facility]   Social Network    Social Network: Not on file  Intimate Partner Violence: Unknown (07/17/2021)   Received from [facility], [facility]   HITS    Physically Hurt: Not on file    Insult or Talk Down To: Not on file    Threaten Physical Harm: Not on file    Scream or Curse: Not on file    Past Medical History, Surgical history, Social history, and Family history were reviewed and updated as appropriate.   Please see review of systems for further details on the patient's review from today.   Objective:   Physical Exam:  There were no vitals taken for this visit.  Physical Exam Constitutional:      General: She is not in acute distress. Musculoskeletal:        General: No deformity.  Neurological:     Mental Status: She is alert and oriented to person, place, and time.     Cranial Nerves: No dysarthria.     Coordination: Coordination normal.  Psychiatric:        Attention and Perception: Attention and perception normal. She does not perceive auditory or visual hallucinations.        Mood and Affect: Mood normal. Mood is not anxious or depressed. Affect is not labile, angry or inappropriate.        Speech: Speech normal. Speech is not rapid and pressured.        Behavior: Behavior normal. Behavior is cooperative.        Thought Content: Thought content normal. Thought content is not paranoid or delusional. Thought content does not include homicidal or suicidal ideation. Thought content does not include suicidal plan.        Cognition and Memory: Cognition and memory normal.        Judgment: Judgment normal.     Comments: Insight intact No word finiding problems in the office     Lab Review:     Component Value Date/Time   NA 141 03/19/2022 0850   K 4.0 03/19/2022 0850   CL 104 03/19/2022 0850   CO2 25 03/19/2022 0850   GLUCOSE 125 (H) 03/19/2022 0850   BUN 17 03/19/2022 0850   CREATININE 0.89 03/19/2022 0850    CREATININE 0.91 08/27/2021 1011   CALCIUM 9.9 03/19/2022 0850   PROT 7.3 03/19/2022 0850   ALBUMIN 4.9 03/19/2022 0850   AST 19 03/19/2022 0850   ALT 16 03/19/2022 0850   ALKPHOS 39 03/19/2022 0850   BILITOT 0.7 03/19/2022 0850   GFRNONAA >60 03/19/2022 0850   GFRAA >60 01/08/2019 1520       Component Value Date/Time   WBC 7.2 03/19/2022 0850   RBC 4.11 03/19/2022 0850   HGB 12.6 03/19/2022 0850   HCT 39.5 03/19/2022 0850   PLT 355 03/19/2022 0850   MCV 96.1 03/19/2022 0850   MCH 30.7 03/19/2022 0850   MCHC 31.9 03/19/2022 0850   RDW 13.7 03/19/2022 0850   LYMPHSABS 1.4 03/19/2022 0850   MONOABS 0.6 03/19/2022 0850  EOSABS 0.2 03/19/2022 0850   BASOSABS 0.1 03/19/2022 0850    Lithium Lvl  Date Value Ref Range Status  01/26/2022 0.8 0.6 - 1.2 mmol/L Final  01/26/22 lithium level 0.8 on 600 mg daily 08/27/21 lithium level 0.8 on 600 mg daily   TSH 3.2, normal folate and low normal B12   No results found for: "PHENYTOIN", "PHENOBARB", "VALPROATE", "CBMZ"   .res Assessment: Plan:    Adilena was seen today for follow-up and sleeping problem.  Diagnoses and all orders for this visit:  Bipolar disorder with moderate depression (HCC)  Lithium-induced tremor  Insomnia due to mental condition  Mild cognitive impairment  Lithium use    30 min face to face time with patient was spent on counseling and coordination of care. We discussed Hx Very stable bipolar with good response to LITHIUM .  Tremor better with less lithium.  Counseled patient regarding potential benefits, risks, and side effects of lithium to include potential risk of lithium affecting thyroid and renal function.  Discussed need for periodic lab monitoring to determine drug level and to assess for potential adverse effects.  Counseled patient regarding signs and symptoms of lithium toxicity and advised that they notify office immediately or seek urgent medical attention if experiencing these signs and  symptoms.  Patient advised to contact office with any questions or concerns.  Option propranolol 10-20 mg BID prn tremor.    Creatinine was 0.82 in September, calcium 9.9 and overall BMP was unremarkable at that time with a normal CBC.  Lithium level May 12, 2018 was 0.6. 08/27/21 lithium level 0.8 on 600 mg daily  01/26/22 lithium level 0.8 on 600 mg daily  CHECK LITHIUM LEVEL.  Tremor is 50% better with reduction lithium from 600 to 450 mg daily. Trial B6 (pyridoxine) 250 mg tablets 1 daily for 1 week then 1 twice daily for lithium tremor  DT forgetfulness checked B12 and folate and TSH and had low normal B12 continue B 12 or B complex DT low normal B12  continue lamotrigine 100 mg as planned  Sleep hygiene.  Trial without caffeine. DC Clonazepam and switch to diazepam 20 mg HS equivalent dose  If it fails consider chlorpromazine.    Counseled patient regarding potential benefits, risks, and side effects of Lamictal to include potential risk of Stevens-Johnson syndrome. Advised patient to stop taking Lamictal and contact office immediately if rash develops and to seek urgent medical attention if rash is severe and/or spreading quickly.   We discussed the short-term risks associated with benzodiazepines including sedation and increased fall risk among others.  Discussed long-term side effect risk including dependence, potential withdrawal symptoms, and the potential eventual dose-related risk of dementia.  But recent studies from 2020 dispute this association between benzodiazepines and dementia risk. Newer studies in 2020 do not support an association with dementia.  Fu 6 mos  Meredith Staggers, MD, DFAPA'  Please see After Visit Summary for patient specific instructions.    No future appointments.    No orders of the defined types were placed in this encounter.   -------------------------------

## 2023-03-03 NOTE — Telephone Encounter (Signed)
At appt today:  DC Clonazepam and switch to diazepam 20 mg HS equivalent dose   Pt informed and told she has a script at the pharmacy for diazepam.

## 2023-03-03 NOTE — Telephone Encounter (Signed)
Next appt is 03/03/23. Dondra Spry went to pick up her Clonazepam 30 days. [facility] told her they need a new prescription for Clonazepam 1 mg.   [id number] [place], [place] - [address] AT [facility] [address] [facility] [address]   Phone: [phone]  Fax: [phone]

## 2023-03-04 DIAGNOSIS — Z029 Encounter for administrative examinations, unspecified: Secondary | ICD-10-CM | POA: Diagnosis not present

## 2023-03-04 NOTE — Telephone Encounter (Signed)
Patient called in and informed CR that she has switched pharmacies and now all prescriptions starting  [facility] [address] [facility] [address] going forward. Ph: [phone]

## 2023-03-04 NOTE — Telephone Encounter (Signed)
Updated pharmacy profile.

## 2023-03-23 DIAGNOSIS — M79673 Pain in unspecified foot: Secondary | ICD-10-CM | POA: Diagnosis not present

## 2023-03-23 DIAGNOSIS — M792 Neuralgia and neuritis, unspecified: Secondary | ICD-10-CM | POA: Diagnosis not present

## 2023-03-23 DIAGNOSIS — M7741 Metatarsalgia, right foot: Secondary | ICD-10-CM | POA: Diagnosis not present

## 2023-03-23 DIAGNOSIS — M2022 Hallux rigidus, left foot: Secondary | ICD-10-CM | POA: Diagnosis not present

## 2023-03-30 ENCOUNTER — Other Ambulatory Visit: Payer: Self-pay

## 2023-03-30 ENCOUNTER — Telehealth: Payer: Self-pay | Admitting: Psychiatry

## 2023-03-30 DIAGNOSIS — F5105 Insomnia due to other mental disorder: Secondary | ICD-10-CM

## 2023-03-30 MED ORDER — DIAZEPAM 10 MG PO TABS: 20.0000 mg | ORAL_TABLET | Freq: Every evening | ORAL | 0 refills | Status: DC

## 2023-03-30 MED ORDER — MIRTAZAPINE 45 MG PO TABS: 45.0000 mg | ORAL_TABLET | Freq: Every day | ORAL | 0 refills | Status: DC

## 2023-03-30 NOTE — Telephone Encounter (Signed)
Pended Diazepam 20 mg and Mirtazapine 45 mg to requested pharmacy. Changed primary pharmacy.

## 2023-03-30 NOTE — Telephone Encounter (Signed)
Call to RS

## 2023-03-30 NOTE — Telephone Encounter (Signed)
Pt called at 11:22a.  She

## 2023-03-30 NOTE — Telephone Encounter (Signed)
Pt is asking for refill of Diazepam to [facility].  She said she takes her last 2 pills tonight.  She is also asking for Mirtazapine to be sent in as well.  She also would like [facility] [address] for all her future refills on all medicines.  No upcoming appt scheduled.

## 2023-03-31 NOTE — Telephone Encounter (Signed)
Pt due for appt in May.  I LVM today to call and schedule appt

## 2023-04-25 DIAGNOSIS — M1612 Unilateral primary osteoarthritis, left hip: Secondary | ICD-10-CM | POA: Diagnosis not present

## 2023-04-25 DIAGNOSIS — M1712 Unilateral primary osteoarthritis, left knee: Secondary | ICD-10-CM | POA: Diagnosis not present

## 2023-04-28 ENCOUNTER — Telehealth: Payer: Self-pay | Admitting: Psychiatry

## 2023-04-28 NOTE — Telephone Encounter (Signed)
Patient called in stating that she needs medication for sleeping. She has been waking up between 3 and 4 and is unable to go back to sleep. Ph: [phone] [facility] [address]

## 2023-04-28 NOTE — Telephone Encounter (Signed)
Called pt no answer °

## 2023-04-29 ENCOUNTER — Other Ambulatory Visit: Payer: Self-pay | Admitting: Psychiatry

## 2023-04-29 MED ORDER — TRAZODONE HCL 100 MG PO TABS: ORAL_TABLET | ORAL | 0 refills | Status: DC

## 2023-04-29 NOTE — Telephone Encounter (Signed)
At last appt we said if mirtazapine failed then we would retry trazodone which worked a while in the past.  Tell her to stop mirtazapine and start trazodone 100 mg tablet 1-2 tablets at night.  She can continue diazepam 20 mg HS for now.  I'll send trazodone

## 2023-04-29 NOTE — Telephone Encounter (Signed)
Pt was notified on how to take the medication before bedtime.   Medication was called and canceled at [facility] and [facility] [place] per pt rqst.

## 2023-04-29 NOTE — Telephone Encounter (Signed)
Pt LVM at 6:13p and 8:49p stating the same thing.  She wants Dr Jennelle Human to prescribe something "heavier" for sleep.  She wakes up at 2:30p and can't go back to sleep.  No upcoming appt scheduled.

## 2023-05-02 ENCOUNTER — Other Ambulatory Visit: Payer: Self-pay | Admitting: Psychiatry

## 2023-05-02 NOTE — Telephone Encounter (Signed)
Yes, she took this dose before

## 2023-05-02 NOTE — Telephone Encounter (Signed)
Pt lvm that she needs her valium [facility] [address]

## 2023-05-02 NOTE — Telephone Encounter (Signed)
Spoke with pt and let her know the Trazodone dose is 100 mg (1-2 nightly) for sleep.

## 2023-05-02 NOTE — Telephone Encounter (Signed)
Kaitlyn Lowe made her follow up appt for 08/15/23.  She asked if the Trazodone was supposed to be 100mg .  She asked for clarification of this dose.

## 2023-05-03 ENCOUNTER — Telehealth: Payer: Self-pay | Admitting: Psychiatry

## 2023-05-03 ENCOUNTER — Telehealth: Payer: Self-pay

## 2023-05-03 DIAGNOSIS — F5105 Insomnia due to other mental disorder: Secondary | ICD-10-CM

## 2023-05-03 MED ORDER — DIAZEPAM 10 MG PO TABS: 20.0000 mg | ORAL_TABLET | Freq: Every evening | ORAL | 0 refills | Status: DC

## 2023-05-03 NOTE — Telephone Encounter (Signed)
Pt called and LVM on 1/20 at 5:34p and 5:52p requesting the diazepam.  The encounter Sarah left earlier in the day " Pt lvm that she needs her valium sent to [facility] [place]".    Pls call and advise if there is a problem.  Next appt 5/5

## 2023-05-03 NOTE — Telephone Encounter (Signed)
[id number] [place], [place] - [address], [place] [address]  This is the pharmacy it was sent to.

## 2023-05-03 NOTE — Telephone Encounter (Signed)
Pt lvm that it needs to [facility] [address]

## 2023-05-03 NOTE — Telephone Encounter (Signed)
Pended [facility]ium 10 mg to [facility] [address]

## 2023-05-03 NOTE — Telephone Encounter (Signed)
Diazepam was addressed in a different encounter per Maralyn Sago

## 2023-05-29 ENCOUNTER — Telehealth: Payer: Self-pay | Admitting: Psychiatry

## 2023-05-29 DIAGNOSIS — F5105 Insomnia due to other mental disorder: Secondary | ICD-10-CM

## 2023-05-30 ENCOUNTER — Other Ambulatory Visit: Payer: Self-pay

## 2023-05-30 DIAGNOSIS — F5105 Insomnia due to other mental disorder: Secondary | ICD-10-CM

## 2023-05-30 MED ORDER — DIAZEPAM 10 MG PO TABS: 20.0000 mg | ORAL_TABLET | Freq: Every evening | ORAL | 0 refills | Status: DC

## 2023-05-30 NOTE — Telephone Encounter (Signed)
Pended RF for diazepam 10 mg to [facility] [address].  She is not due until 2/18, but said she is out.

## 2023-05-30 NOTE — Telephone Encounter (Signed)
Kaitlyn Lowe called and LM at 10:09 indicating that she is out of this medication and needs to be able to fill today to have it for tonight.  Next appt 5/5.  Be sure it is being requested from [facility] [address]

## 2023-05-30 NOTE — Telephone Encounter (Signed)
LF 1/21, due 2/18

## 2023-06-03 DIAGNOSIS — M1712 Unilateral primary osteoarthritis, left knee: Secondary | ICD-10-CM | POA: Diagnosis not present

## 2023-06-06 ENCOUNTER — Other Ambulatory Visit: Payer: Self-pay | Admitting: Psychiatry

## 2023-06-07 DIAGNOSIS — M7741 Metatarsalgia, right foot: Secondary | ICD-10-CM | POA: Diagnosis not present

## 2023-06-07 DIAGNOSIS — M2021 Hallux rigidus, right foot: Secondary | ICD-10-CM | POA: Diagnosis not present

## 2023-06-30 ENCOUNTER — Other Ambulatory Visit: Payer: Self-pay | Admitting: Psychiatry

## 2023-06-30 DIAGNOSIS — F5105 Insomnia due to other mental disorder: Secondary | ICD-10-CM

## 2023-07-11 ENCOUNTER — Telehealth: Payer: Self-pay | Admitting: Psychiatry

## 2023-07-11 NOTE — Telephone Encounter (Signed)
 Pt reporting significant tremor, is barely able to get cup or spoon to her mouth. Notation in previous notes of lithium tremor and she should take B6.  Tremor is 50% better with reduction lithium from 600 to 450 mg daily. Trial B6 (pyridoxine) 250 mg tablets 1 daily for 1 week then 1 twice daily for lithium tremor. She reports not taking any supplements, feels she takes too many medications. She is also reporting some memory issues, which is not new.  DT forgetfulness checked B12 and folate and TSH and had low normal B12 continue B 12 or B complex DT low normal B12. Reports missing PT appts because she gets dates mixed up.   Taking:  Lamictal 100 mg  Lithium 150 mg - 3 capsules at dinner Diazepam 10 mg, 2 qhs Trazodone 100 mg 1-2

## 2023-07-11 NOTE — Telephone Encounter (Signed)
 Patient called in stating that she is having some issues with medication. Her hands are trembling " barely can hold a cup to her mouth and some people have noticed it." She would like to see CC regarding this. PH: [phone]

## 2023-07-11 NOTE — Telephone Encounter (Signed)
 She has active order at [facility] for lithoum level.  Important to get it ASAP to rule out toxicity

## 2023-07-12 ENCOUNTER — Other Ambulatory Visit: Payer: Self-pay | Admitting: Psychiatry

## 2023-07-12 DIAGNOSIS — G251 Drug-induced tremor: Secondary | ICD-10-CM

## 2023-07-12 DIAGNOSIS — F3132 Bipolar disorder, current episode depressed, moderate: Secondary | ICD-10-CM | POA: Diagnosis not present

## 2023-07-12 DIAGNOSIS — Z79899 Other long term (current) drug therapy: Secondary | ICD-10-CM | POA: Diagnosis not present

## 2023-07-12 NOTE — Telephone Encounter (Signed)
 Please also send a my chart message for her to get lithium level.  I'm afraid she could be toxic.

## 2023-07-12 NOTE — Telephone Encounter (Signed)
 Was able to reach patient by phone and requested she get labs done ASAP. She said she took lithium last night and will get labs drawn today.

## 2023-07-12 NOTE — Telephone Encounter (Signed)
 Sent MyChart level and will try to contact her again by phone.

## 2023-07-12 NOTE — Telephone Encounter (Signed)
 LVM to [facility]

## 2023-07-13 LAB — LITHIUM LEVEL: Lithium Lvl: 0.5 mmol/L — ABNORMAL LOW (ref 0.6–1.2)

## 2023-07-13 NOTE — Telephone Encounter (Signed)
 Her lithium level was normal.  So to treat the tremor I would recommend propranlol 10 mg tab 1-2 tablets twice daily as needed.  For her memory rec she take B12 supplements = 2mg   daily if she is not taking it.  We'll discuss memory issues at the next appt.

## 2023-07-14 NOTE — Telephone Encounter (Signed)
 LVM that I would try to reach her tomorrow.

## 2023-07-15 MED ORDER — PROPRANOLOL HCL 10 MG PO TABS: 10.0000 mg | ORAL_TABLET | Freq: Two times a day (BID) | ORAL | 0 refills | Status: DC

## 2023-07-15 NOTE — Telephone Encounter (Signed)
 Tried calling pt but goes to VM. Did not leave msg as I had left one yesterday. Will continue to try to reach her.

## 2023-07-15 NOTE — Telephone Encounter (Signed)
 Was able to reach patient. She is on a camping trip. I told her I would send in [facility] [address]. Told her to take B12, 2000 mcg BID.  She has FU in May. Said the tremor was a bigger concern for her than the memory issues. Told her that the propranolol should hopefully help with the tremor.

## 2023-07-21 DIAGNOSIS — H25813 Combined forms of age-related cataract, bilateral: Secondary | ICD-10-CM | POA: Diagnosis not present

## 2023-07-26 DIAGNOSIS — M1712 Unilateral primary osteoarthritis, left knee: Secondary | ICD-10-CM | POA: Diagnosis not present

## 2023-07-30 DIAGNOSIS — B349 Viral infection, unspecified: Secondary | ICD-10-CM | POA: Diagnosis not present

## 2023-07-30 DIAGNOSIS — R051 Acute cough: Secondary | ICD-10-CM | POA: Diagnosis not present

## 2023-08-01 DIAGNOSIS — M25562 Pain in left knee: Secondary | ICD-10-CM | POA: Diagnosis not present

## 2023-08-01 DIAGNOSIS — M6281 Muscle weakness (generalized): Secondary | ICD-10-CM | POA: Diagnosis not present

## 2023-08-08 ENCOUNTER — Other Ambulatory Visit: Payer: Self-pay | Admitting: Physician Assistant

## 2023-08-08 DIAGNOSIS — M7742 Metatarsalgia, left foot: Secondary | ICD-10-CM | POA: Diagnosis not present

## 2023-08-08 DIAGNOSIS — M2021 Hallux rigidus, right foot: Secondary | ICD-10-CM | POA: Diagnosis not present

## 2023-08-08 DIAGNOSIS — M7741 Metatarsalgia, right foot: Secondary | ICD-10-CM | POA: Diagnosis not present

## 2023-08-09 DIAGNOSIS — M25562 Pain in left knee: Secondary | ICD-10-CM | POA: Diagnosis not present

## 2023-08-09 DIAGNOSIS — M6281 Muscle weakness (generalized): Secondary | ICD-10-CM | POA: Diagnosis not present

## 2023-08-10 ENCOUNTER — Other Ambulatory Visit: Payer: Self-pay | Admitting: Psychiatry

## 2023-08-11 ENCOUNTER — Other Ambulatory Visit: Payer: Self-pay

## 2023-08-11 ENCOUNTER — Emergency Department (HOSPITAL_COMMUNITY)

## 2023-08-11 ENCOUNTER — Emergency Department (HOSPITAL_COMMUNITY)
Admission: EM | Admit: 2023-08-11 | Discharge: 2023-08-11 | Disposition: A | Discharge status: Home / Self Care | Attending: Emergency Medicine | Admitting: Emergency Medicine

## 2023-08-11 DIAGNOSIS — Y92410 Unspecified street and highway as the place of occurrence of the external cause: Secondary | ICD-10-CM | POA: Insufficient documentation

## 2023-08-11 DIAGNOSIS — S199XXA Unspecified injury of neck, initial encounter: Secondary | ICD-10-CM | POA: Diagnosis not present

## 2023-08-11 DIAGNOSIS — S61511A Laceration without foreign body of right wrist, initial encounter: Secondary | ICD-10-CM | POA: Insufficient documentation

## 2023-08-11 DIAGNOSIS — S0181XA Laceration without foreign body of other part of head, initial encounter: Secondary | ICD-10-CM | POA: Diagnosis not present

## 2023-08-11 DIAGNOSIS — S0101XA Laceration without foreign body of scalp, initial encounter: Secondary | ICD-10-CM | POA: Diagnosis not present

## 2023-08-11 DIAGNOSIS — M50222 Other cervical disc displacement at C5-C6 level: Secondary | ICD-10-CM | POA: Diagnosis not present

## 2023-08-11 DIAGNOSIS — S40212A Abrasion of left shoulder, initial encounter: Secondary | ICD-10-CM | POA: Diagnosis not present

## 2023-08-11 DIAGNOSIS — R918 Other nonspecific abnormal finding of lung field: Secondary | ICD-10-CM | POA: Diagnosis not present

## 2023-08-11 DIAGNOSIS — G4489 Other headache syndrome: Secondary | ICD-10-CM | POA: Diagnosis not present

## 2023-08-11 DIAGNOSIS — S0990XA Unspecified injury of head, initial encounter: Secondary | ICD-10-CM | POA: Diagnosis not present

## 2023-08-11 MED ORDER — LIDOCAINE HCL (PF) 1 % IJ SOLN
5.0000 mL | Freq: Once | INTRAMUSCULAR | Status: AC
  Administered 2023-08-11: 5 mL via INTRADERMAL
  Filled 2023-08-11: qty 30
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
## 2023-08-11 NOTE — Discharge Instructions (Addendum)
 WOUND CARE Your stitches are dissolving and you will not need to have it   Keep area clean and dry for 24 hours. Do not remove bandage, if applied.  After 24 hours, remove bandage and wash wound gently with mild soap and warm water . Reapply a new bandage after cleaning wound, if directed.  Continue daily cleansing with soap and water  until stitches/staples are removed.  Do not apply any ointments or creams to the wound while stitches/staples are in place, as this may cause delayed healing.  Seek medical careif you experience any of the following signs of infection: Swelling, redness, pus drainage, streaking, fever >101.0 F  Seek care if you experience excessive bleeding that does not stop after 15-20 minutes of constant, firm pressure.

## 2023-08-15 ENCOUNTER — Ambulatory Visit: Payer: Medicare PPO | Admitting: Psychiatry

## 2023-08-28 ENCOUNTER — Other Ambulatory Visit: Payer: Self-pay | Admitting: Psychiatry

## 2023-08-28 DIAGNOSIS — F5105 Insomnia due to other mental disorder: Secondary | ICD-10-CM

## 2023-08-29 NOTE — Telephone Encounter (Signed)
 Pt advises that she does not have any more medication.

## 2023-08-29 NOTE — Telephone Encounter (Signed)
 Pt called at 11:50a requesting refill of Diazepam  to  [id number] Jonette Nestle,  - [address], [place] [address] Phone: [phone]  Fax: [phone]    Next appt 7/2

## 2023-09-10 ENCOUNTER — Other Ambulatory Visit: Payer: Self-pay | Admitting: Psychiatry

## 2023-09-28 DIAGNOSIS — M7741 Metatarsalgia, right foot: Secondary | ICD-10-CM | POA: Diagnosis not present

## 2023-09-28 DIAGNOSIS — G609 Hereditary and idiopathic neuropathy, unspecified: Secondary | ICD-10-CM | POA: Diagnosis not present

## 2023-09-28 DIAGNOSIS — M2021 Hallux rigidus, right foot: Secondary | ICD-10-CM | POA: Diagnosis not present

## 2023-09-28 DIAGNOSIS — M7742 Metatarsalgia, left foot: Secondary | ICD-10-CM | POA: Diagnosis not present

## 2023-10-06 DIAGNOSIS — Z1231 Encounter for screening mammogram for malignant neoplasm of breast: Secondary | ICD-10-CM | POA: Diagnosis not present

## 2023-10-12 ENCOUNTER — Encounter: Payer: Self-pay | Admitting: Psychiatry

## 2023-10-12 ENCOUNTER — Ambulatory Visit: Admitting: Psychiatry

## 2023-10-12 DIAGNOSIS — F5105 Insomnia due to other mental disorder: Secondary | ICD-10-CM

## 2023-10-12 DIAGNOSIS — Z79899 Other long term (current) drug therapy: Secondary | ICD-10-CM | POA: Diagnosis not present

## 2023-10-12 DIAGNOSIS — G3184 Mild cognitive impairment, so stated: Secondary | ICD-10-CM

## 2023-10-12 DIAGNOSIS — F3132 Bipolar disorder, current episode depressed, moderate: Secondary | ICD-10-CM | POA: Diagnosis not present

## 2023-10-12 DIAGNOSIS — G251 Drug-induced tremor: Secondary | ICD-10-CM | POA: Diagnosis not present
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
## 2023-10-12 NOTE — Patient Instructions (Signed)
 Start Vraylar 1.5 mg capsule 1 each morning Reduce lithium  to 2 daily Stop lamotrigine  Stop propranolol  unless it helps.

## 2023-10-13 ENCOUNTER — Telehealth: Payer: Self-pay | Admitting: Psychiatry

## 2023-10-13 NOTE — Telephone Encounter (Signed)
 Next appt is 11/11/23. Kaitlyn Lowe was seen recently and told to start Vraylar. She wants to know when she should start this medication. Her phone number is [phone].

## 2023-10-13 NOTE — Telephone Encounter (Signed)
 Pt was seen  yesterday. Reviewed recommendations with her.

## 2023-10-26 ENCOUNTER — Other Ambulatory Visit: Payer: Self-pay | Admitting: Psychiatry

## 2023-10-26 DIAGNOSIS — F5105 Insomnia due to other mental disorder: Secondary | ICD-10-CM

## 2023-10-26 NOTE — Telephone Encounter (Signed)
 Pt called requesting Diazepam  10 mg. Only 1 pill left.

## 2023-10-27 ENCOUNTER — Other Ambulatory Visit: Payer: Self-pay | Admitting: Psychiatry

## 2023-11-06 ENCOUNTER — Other Ambulatory Visit: Payer: Self-pay | Admitting: Psychiatry

## 2023-11-11 ENCOUNTER — Ambulatory Visit: Admitting: Psychiatry

## 2023-11-11 ENCOUNTER — Encounter: Payer: Self-pay | Admitting: Psychiatry

## 2023-11-11 DIAGNOSIS — R251 Tremor, unspecified: Secondary | ICD-10-CM | POA: Diagnosis not present

## 2023-11-11 DIAGNOSIS — F3132 Bipolar disorder, current episode depressed, moderate: Secondary | ICD-10-CM

## 2023-11-11 DIAGNOSIS — F5105 Insomnia due to other mental disorder: Secondary | ICD-10-CM | POA: Diagnosis not present

## 2023-11-11 MED ORDER — TRAZODONE HCL 100 MG PO TABS: ORAL_TABLET | ORAL | 0 refills | Status: DC

## 2023-11-11 MED ORDER — CARIPRAZINE HCL 1.5 MG PO CAPS: 1.5000 mg | ORAL_CAPSULE | Freq: Every day | ORAL | 0 refills | Status: AC

## 2023-11-11 MED ORDER — DIAZEPAM 10 MG PO TABS
20.0000 mg | ORAL_TABLET | Freq: Every day | ORAL | 3 refills | Status: AC
Start: 2023-11-11 — End: ?

## 2023-11-11 NOTE — Patient Instructions (Addendum)
 Tremor is unchanged off lithium  and predates Vraylar.  BC of this and neuropathy and forgetfulness suggest neurology evaluation.
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
## 2023-11-14 ENCOUNTER — Telehealth: Payer: Self-pay | Admitting: Psychiatry

## 2023-11-14 ENCOUNTER — Other Ambulatory Visit: Payer: Self-pay | Admitting: Physician Assistant

## 2023-11-14 DIAGNOSIS — I700 Atherosclerosis of aorta: Secondary | ICD-10-CM | POA: Diagnosis not present

## 2023-11-14 DIAGNOSIS — Z0000 Encounter for general adult medical examination without abnormal findings: Secondary | ICD-10-CM | POA: Diagnosis not present

## 2023-11-14 DIAGNOSIS — R7301 Impaired fasting glucose: Secondary | ICD-10-CM | POA: Diagnosis not present

## 2023-11-14 DIAGNOSIS — K117 Disturbances of salivary secretion: Secondary | ICD-10-CM | POA: Diagnosis not present

## 2023-11-14 DIAGNOSIS — Z79899 Other long term (current) drug therapy: Secondary | ICD-10-CM | POA: Diagnosis not present

## 2023-11-14 DIAGNOSIS — E039 Hypothyroidism, unspecified: Secondary | ICD-10-CM | POA: Diagnosis not present

## 2023-11-14 DIAGNOSIS — R251 Tremor, unspecified: Secondary | ICD-10-CM | POA: Diagnosis not present

## 2023-11-14 DIAGNOSIS — E782 Mixed hyperlipidemia: Secondary | ICD-10-CM | POA: Diagnosis not present

## 2023-11-14 DIAGNOSIS — M810 Age-related osteoporosis without current pathological fracture: Secondary | ICD-10-CM | POA: Diagnosis not present

## 2023-11-14 NOTE — Telephone Encounter (Signed)
 Pt does report drooling started about 3 weeks after starting Vraylar . Told her to take every other day to see if drooling resolves.

## 2023-11-14 NOTE — Telephone Encounter (Signed)
 Pt states there were a few things she forgot to mention in her appt. She is continously drooling on the right side of her mouth, she is having some balance issues and her Vraylar  is $100 for a 30-day supply. She wanted to know if we could give her any samples or coupons.

## 2023-11-14 NOTE — Telephone Encounter (Signed)
 Patient said she had forgot to mention some things to you on Friday. She is reporting continuous drooling on the right side of her mouth. I asked her if she had seen her dentist lately and she said she went last week, but her appt isn't  until December. She reported having balance issues. She does have diagnosis of neuropathy and I told her that it was likely related to that. She said she saw Dr. Lamarr Gal and she is going to do a referral to a neurologist she has seen previously.    She said she has had 4 episodes where she has her dates mixed up. She said she writes things on the calendar and she still gets dates wrong.   She is c/o cost of Vraylar . Does not require a PA, hasn't met deductible. Pulled samples for her.

## 2023-11-14 NOTE — Telephone Encounter (Signed)
 She started Vraylar  in early July.  When did the drooling start?  If it was since Vraylar  then use a pill box or calendary and cut the Vraylar  to 1.5 mg every other day.  Could take a couple of weeks for drooling to resolve.   I'm glad she will see neurology about the forgetfulness and tremor.

## 2023-11-15 DIAGNOSIS — M1712 Unilateral primary osteoarthritis, left knee: Secondary | ICD-10-CM | POA: Diagnosis not present

## 2023-11-15 NOTE — Telephone Encounter (Signed)
 Pt came to pick up Vraylar .  She stated when she went to the pharmacy they gave her Propranolol .  She said she would like Bridget to call her about taking it.  Next appt 10/15

## 2023-11-15 NOTE — Telephone Encounter (Signed)
 LVM to [facility]

## 2023-11-16 ENCOUNTER — Telehealth: Payer: Self-pay | Admitting: Psychiatry

## 2023-11-16 NOTE — Telephone Encounter (Signed)
 Next visit is 01/25/24. Kaitlyn Lowe would like someone to call her back at [phone]. Its regarding the attached message.

## 2023-11-16 NOTE — Telephone Encounter (Signed)
 Pt was told yesterday to take Vraylar  every other day. The propranolol  is for tremors. She said she had never heard of it before, but she has been on it for a while.

## 2023-11-16 NOTE — Telephone Encounter (Signed)
 LVM to [facility]

## 2023-11-16 NOTE — Telephone Encounter (Signed)
 Addressed on previous message

## 2023-11-22 DIAGNOSIS — M25562 Pain in left knee: Secondary | ICD-10-CM | POA: Diagnosis not present

## 2023-11-22 DIAGNOSIS — R29898 Other symptoms and signs involving the musculoskeletal system: Secondary | ICD-10-CM | POA: Diagnosis not present

## 2023-11-24 DIAGNOSIS — M25562 Pain in left knee: Secondary | ICD-10-CM | POA: Diagnosis not present

## 2023-11-24 DIAGNOSIS — R29898 Other symptoms and signs involving the musculoskeletal system: Secondary | ICD-10-CM | POA: Diagnosis not present

## 2023-11-29 DIAGNOSIS — R29898 Other symptoms and signs involving the musculoskeletal system: Secondary | ICD-10-CM | POA: Diagnosis not present

## 2023-11-29 DIAGNOSIS — M25562 Pain in left knee: Secondary | ICD-10-CM | POA: Diagnosis not present

## 2023-12-02 DIAGNOSIS — M25562 Pain in left knee: Secondary | ICD-10-CM | POA: Diagnosis not present

## 2023-12-02 DIAGNOSIS — M6281 Muscle weakness (generalized): Secondary | ICD-10-CM | POA: Diagnosis not present

## 2023-12-06 DIAGNOSIS — M6281 Muscle weakness (generalized): Secondary | ICD-10-CM | POA: Diagnosis not present

## 2023-12-06 DIAGNOSIS — M25562 Pain in left knee: Secondary | ICD-10-CM | POA: Diagnosis not present

## 2023-12-09 DIAGNOSIS — M25562 Pain in left knee: Secondary | ICD-10-CM | POA: Diagnosis not present

## 2023-12-09 DIAGNOSIS — R29898 Other symptoms and signs involving the musculoskeletal system: Secondary | ICD-10-CM | POA: Diagnosis not present

## 2023-12-11 ENCOUNTER — Other Ambulatory Visit: Payer: Self-pay | Admitting: Psychiatry

## 2023-12-11 DIAGNOSIS — F5105 Insomnia due to other mental disorder: Secondary | ICD-10-CM

## 2024-01-04 DIAGNOSIS — G609 Hereditary and idiopathic neuropathy, unspecified: Secondary | ICD-10-CM | POA: Diagnosis not present

## 2024-01-04 DIAGNOSIS — M2021 Hallux rigidus, right foot: Secondary | ICD-10-CM | POA: Diagnosis not present

## 2024-01-04 DIAGNOSIS — G5762 Lesion of plantar nerve, left lower limb: Secondary | ICD-10-CM | POA: Diagnosis not present

## 2024-01-05 ENCOUNTER — Telehealth: Payer: Self-pay | Admitting: Neurology

## 2024-01-05 ENCOUNTER — Telehealth: Payer: Self-pay | Admitting: Psychiatry

## 2024-01-05 ENCOUNTER — Institutional Professional Consult (permissible substitution): Admitting: Neurology

## 2024-01-05 NOTE — Telephone Encounter (Signed)
 Pt lvm stating her foot doctor recommends amitriptyline for pain to treat neuropathy. But would have to stop either trazodone  or ambien.  Please advise pt. Contact # [phone]

## 2024-01-05 NOTE — Telephone Encounter (Signed)
 LVM to [facility]

## 2024-01-05 NOTE — Telephone Encounter (Signed)
 NS for neuro visit today.

## 2024-01-06 NOTE — Telephone Encounter (Signed)
 Pt reporting her foot doctor is recommending amitriptyline 10 mg to help with neuropathy. She needs to stop either trazodone  or Ambien. She reports she would like to stop the trazodone . He said he could prescribe the amitriptyline or if you prefer you could prescribe.   Note from 9/25 with Eva Barrack, PA-C   We did discuss potentially trying 10 mg of amitriptyline at bedtime. She is going to discuss with her psychiatrist. She will inform me if she wants me to place a prescription for the amitriptyline. We did discuss the potential adverse effects of this medication. Patient agrees with assessment and plan. Questions were encouraged and answered. [email or web address] Not available 01/04/2024 11:44:46

## 2024-01-06 NOTE — Telephone Encounter (Signed)
 Left second VM to [facility]

## 2024-01-06 NOTE — Telephone Encounter (Signed)
 Pt notified of recommendations

## 2024-01-06 NOTE — Telephone Encounter (Signed)
 Appears pt r/s with Dr. Buck 01/11/24-fyi

## 2024-01-11 ENCOUNTER — Encounter: Payer: Self-pay | Admitting: Neurology

## 2024-01-11 ENCOUNTER — Institutional Professional Consult (permissible substitution): Admitting: Neurology

## 2024-01-13 ENCOUNTER — Emergency Department (HOSPITAL_BASED_OUTPATIENT_CLINIC_OR_DEPARTMENT_OTHER)

## 2024-01-13 ENCOUNTER — Other Ambulatory Visit: Payer: Self-pay

## 2024-01-13 ENCOUNTER — Emergency Department (HOSPITAL_BASED_OUTPATIENT_CLINIC_OR_DEPARTMENT_OTHER)
Admission: EM | Admit: 2024-01-13 | Discharge: 2024-01-13 | Disposition: A | Discharge status: Home / Self Care | Attending: Emergency Medicine | Admitting: Emergency Medicine

## 2024-01-13 DIAGNOSIS — I700 Atherosclerosis of aorta: Secondary | ICD-10-CM | POA: Diagnosis not present

## 2024-01-13 DIAGNOSIS — S0101XA Laceration without foreign body of scalp, initial encounter: Secondary | ICD-10-CM | POA: Diagnosis not present

## 2024-01-13 DIAGNOSIS — W01198A Fall on same level from slipping, tripping and stumbling with subsequent striking against other object, initial encounter: Secondary | ICD-10-CM | POA: Diagnosis not present

## 2024-01-13 DIAGNOSIS — Z23 Encounter for immunization: Secondary | ICD-10-CM | POA: Diagnosis not present

## 2024-01-13 DIAGNOSIS — Z043 Encounter for examination and observation following other accident: Secondary | ICD-10-CM | POA: Diagnosis not present

## 2024-01-13 DIAGNOSIS — M542 Cervicalgia: Secondary | ICD-10-CM | POA: Insufficient documentation

## 2024-01-13 DIAGNOSIS — G9389 Other specified disorders of brain: Secondary | ICD-10-CM | POA: Diagnosis not present

## 2024-01-13 DIAGNOSIS — I771 Stricture of artery: Secondary | ICD-10-CM | POA: Diagnosis not present

## 2024-01-13 DIAGNOSIS — R220 Localized swelling, mass and lump, head: Secondary | ICD-10-CM | POA: Diagnosis not present

## 2024-01-13 DIAGNOSIS — M4802 Spinal stenosis, cervical region: Secondary | ICD-10-CM | POA: Diagnosis not present

## 2024-01-13 DIAGNOSIS — S0990XA Unspecified injury of head, initial encounter: Secondary | ICD-10-CM | POA: Diagnosis not present

## 2024-01-13 DIAGNOSIS — R0989 Other specified symptoms and signs involving the circulatory and respiratory systems: Secondary | ICD-10-CM | POA: Diagnosis not present

## 2024-01-13 DIAGNOSIS — M47812 Spondylosis without myelopathy or radiculopathy, cervical region: Secondary | ICD-10-CM | POA: Diagnosis not present

## 2024-01-13 MED ORDER — TETANUS-DIPHTHERIA TOXOIDS TD 5-2 LFU IM INJ: 0.5000 mL | INJECTION | Freq: Once | INTRAMUSCULAR | Status: DC

## 2024-01-13 MED ORDER — TETANUS-DIPHTH-ACELL PERTUSSIS 5-2-15.5 LF-MCG/0.5 IM SUSP
0.5000 mL | Freq: Once | INTRAMUSCULAR | Status: DC
  Administered 2024-01-13: 0.5 mL via INTRAMUSCULAR

## 2024-01-13 NOTE — ED Triage Notes (Signed)
 Tripped on carpet walking into room. Fell back and struck back of head on wood table. Bleeding controlled on arrival without pressure. Denies LOC. No thinners. Just started Amitriptyline  2 days ago for sleep- stopped trazadone. Has not taken today.

## 2024-01-13 NOTE — ED Provider Notes (Signed)
  EMERGENCY DEPARTMENT AT [facility] Provider Note   CSN: [id number] Arrival date & time: 01/13/24  2033     Patient presents with: No chief complaint on file.   Kaitlyn Lowe is a 73 y.o. female.  Who presents to the ED after a fall.  Patient tripped over the computer chair mat in her home office tonight.  Struck the back of her head on a desk.  No LOC.  No vomiting.  Bleeding from posterior scalp wound.  Some neck pain.  No other injuries.  Denies chest pain shortness of breath abdominal pain pain in extremities.  No anticoagulation   HPI     Prior to Admission medications   Medication Sig Start Date End Date Taking? Authorizing Provider  alendronate (FOSAMAX) 70 MG tablet Take 70 mg by mouth once a week. 12/21/21   [provider]  azelastine (ASTELIN) 0.1 % nasal spray Place 1 spray into both nostrils daily as needed for rhinitis or allergies. Use in each nostril as directed    [provider]  calcium-vitamin D (OSCAL WITH D) 500-5 MG-MCG tablet Take 1 tablet by mouth.    [provider]  cariprazine  (VRAYLAR ) 1.5 MG capsule Take 1 capsule (1.5 mg total) by mouth daily. 11/11/23   Cottle, Lorene KANDICE Raddle., MD  Cyanocobalamin  (VITAMIN B 12 PO) Take by mouth.    [provider]  diazepam  (VALIUM ) 10 MG tablet Take 2 tablets (20 mg total) by mouth at bedtime. 11/11/23   Cottle, Lorene KANDICE Raddle., MD  diclofenac Sodium (VOLTAREN ARTHRITIS PAIN) 1 % GEL Apply topically as needed.    [provider]  diphenhydrAMINE (BENADRYL) 25 MG tablet Take 25 mg by mouth every 6 (six) hours as needed for itching.    [provider]  docusate sodium (COLACE) 100 MG capsule Take 200 mg by mouth in the morning.    [provider]  levothyroxine (SYNTHROID) 100 MCG tablet Take 100 mcg by mouth daily before breakfast. Patient taking differently: Take 50 mcg by mouth daily before breakfast.    [provider]  levothyroxine  (SYNTHROID) 50 MCG tablet TAKE 1 TABLET EVERY MORNING ON AN EMPTY STOMACH 10/27/23   Cottle, Lorene KANDICE Raddle., MD  meloxicam (MOBIC) 15 MG tablet Take 15 mg by mouth daily.    [provider]  Omeprazole 20 MG TBEC Take 20 mg by mouth daily.    [provider]  Polyethyl Glycol-Propyl Glycol (SYSTANE OP) Place 1 drop into both eyes in the morning.    [provider]  propranolol  (INDERAL ) 10 MG tablet TAKE 1-2 TABLETS (10-20 MG TOTAL) BY MOUTH 2 (TWO) TIMES DAILY. 11/14/23   Cottle, Lorene KANDICE Raddle., MD  traMADol  (ULTRAM ) 50 MG tablet Take 50 mg by mouth every 6 (six) hours as needed.    [provider]  traZODone  (DESYREL ) 100 MG tablet TAKE 1-2 TABLETS AT NIGHT FOR SLEEP 12/11/23   Cottle, Lorene KANDICE Raddle., MD    Allergies: Codeine    Review of Systems  Updated Vital Signs There were no vitals taken for this visit.  Physical Exam Vitals and nursing note reviewed.  HENT:     Head:     Comments: Small hematoma with 2 cm laceration over posterior occiput oozing no pulsatile bleeding Eyes:     Pupils: Pupils are equal, round, and reactive to light.  Neck:     Comments: C-collar in place exam deferred Cardiovascular:     Rate and Rhythm:  Normal rate and regular rhythm.  Pulmonary:     Effort: Pulmonary effort is normal.     Breath sounds: Normal breath sounds.  Abdominal:     Palpations: Abdomen is soft.     Tenderness: There is no abdominal tenderness.  Musculoskeletal:     Cervical back: Neck supple.     Comments: 5 out of 5 motor strength bilateral upper and lower extremities with full active range of motion Sensation intact light touch throughout  Skin:    General: Skin is warm and dry.  Neurological:     General: No focal deficit present.     Mental Status: She is alert.     Sensory: No sensory deficit.     Motor: No weakness.  Psychiatric:        Mood and Affect: Mood normal.     (all labs ordered are listed, but only abnormal results are  displayed) Labs Reviewed - No data to display  EKG: None  Radiology: DG Chest Portable 1 View Result Date: 01/13/2024 CLINICAL DATA:  fall EXAM: PORTABLE CHEST - 1 VIEW COMPARISON:  May 1 25 FINDINGS: Lower lung volumes. Streaky atelectasis in the left lung base. No focal airspace consolidation, pleural effusion, or pneumothorax. No cardiomegaly. Tortuous aorta with aortic atherosclerosis. No acute fracture or destructive lesions. Multilevel thoracic osteophytosis. Osteopenia. IMPRESSION: No acute cardiopulmonary abnormality. Electronically Signed   By: Rogelia Myers M.D.   On: 01/13/2024 21:49   CT Cervical Spine Wo Contrast Result Date: 01/13/2024 CLINICAL DATA:  Status post fall. EXAM: CT CERVICAL SPINE WITHOUT CONTRAST TECHNIQUE: Multidetector CT imaging of the cervical spine was performed without intravenous contrast. Multiplanar CT image reconstructions were also generated. RADIATION DOSE REDUCTION: This exam was performed according to the departmental dose-optimization program which includes automated exposure control, adjustment of the mA and/or kV according to patient size and/or use of iterative reconstruction technique. COMPARISON:  Aug 11, 2023 FINDINGS: Alignment: There is straightening of the normal cervical spine lordosis. Skull base and vertebrae: No acute fracture. No primary bone lesion or focal pathologic process. Soft tissues and spinal canal: No prevertebral fluid or swelling. No visible canal hematoma. Disc levels: Mild to moderate severity endplate sclerosis, mild anterior osteophyte formation and mild to moderate severity posterior bony spurring are seen at the level of C5-C6. There is marked severity intervertebral disc space narrowing at the level of C5-C6. Bilateral marked severity multilevel facet joint hypertrophy is noted. Upper chest: Negative. Other: None. IMPRESSION: 1. No acute fracture or subluxation in the cervical spine. 2. Marked severity degenerative changes at the  level of C5-C6. Electronically Signed   By: Suzen Dials M.D.   On: 01/13/2024 21:43   CT Head Wo Contrast Result Date: 01/13/2024 CLINICAL DATA:  Status post trauma. EXAM: CT HEAD WITHOUT CONTRAST TECHNIQUE: Contiguous axial images were obtained from the base of the skull through the vertex without intravenous contrast. RADIATION DOSE REDUCTION: This exam was performed according to the departmental dose-optimization program which includes automated exposure control, adjustment of the mA and/or kV according to patient size and/or use of iterative reconstruction technique. COMPARISON:  Aug 11, 2023 FINDINGS: Brain: There is generalized cerebral atrophy with widening of the extra-axial spaces and ventricular dilatation. There are areas of decreased attenuation within the white matter tracts of the supratentorial brain, consistent with microvascular disease changes. Vascular: No hyperdense vessel or unexpected calcification. Skull: Normal. Negative for fracture or focal lesion. Sinuses/Orbits: No acute finding. Other: Mild right posterior parietal scalp soft tissue swelling  is noted. IMPRESSION: 1. No acute intracranial abnormality. 2. Generalized cerebral atrophy and microvascular disease changes of the supratentorial brain. Electronically Signed   By: Suzen Dials M.D.   On: 01/13/2024 21:38     .Laceration Repair  Date/Time: 01/13/2024 10:41 PM  Performed by: Pamella Ozell LABOR, DO Authorized by: Pamella Ozell LABOR, DO   Consent:    Consent obtained:  Verbal   Consent given by:  Patient   Risks discussed:  Infection, poor cosmetic result, nerve damage and poor wound healing   Alternatives discussed:  No treatment Universal protocol:    Immediately prior to procedure, a time out was called: yes     Patient identity confirmed:  Verbally with patient Anesthesia:    Anesthesia method:  None Laceration details:    Location:  Scalp   Scalp location:  Occipital   Length (cm):  2   Depth (mm):   0.5 Pre-procedure details:    Preparation:  Patient was prepped and draped in usual sterile fashion Skin repair:    Repair method:  Staples   Number of staples:  2 Approximation:    Approximation:  Close Repair type:    Repair type:  Simple Post-procedure details:    Dressing:  Sterile dressing   Procedure completion:  Tolerated    Medications Ordered in the ED  tetanus & diphtheria toxoids (adult) (TENIVAC) injection 0.5 mL (has no administration in time range)                                    Medical Decision Making 73 year old lady not on anticoagulation presents after fall at home.  Head trauma.  No LOC.  No vomiting.  No other injuries.  CT head C-spine chest x-ray showed no acute traumatic findings.  Scalp laceration repaired with 2 staples gauze pressure dressing applied head wrap.  Counseled her on wound management.  Stable for discharge  Amount and/or Complexity of Data Reviewed Radiology: ordered.  Risk Prescription drug management.        Final diagnoses:  Laceration of scalp, initial encounter    ED Discharge Orders     None          Pamella Ozell LABOR, DO 01/13/24 2243
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
## 2024-01-17 ENCOUNTER — Emergency Department (HOSPITAL_BASED_OUTPATIENT_CLINIC_OR_DEPARTMENT_OTHER)
Admission: EM | Admit: 2024-01-17 | Discharge: 2024-01-18 | Disposition: A | Discharge status: Home / Self Care | Attending: Emergency Medicine | Admitting: Emergency Medicine

## 2024-01-17 ENCOUNTER — Emergency Department (HOSPITAL_BASED_OUTPATIENT_CLINIC_OR_DEPARTMENT_OTHER): Admitting: Radiology

## 2024-01-17 ENCOUNTER — Other Ambulatory Visit: Payer: Self-pay

## 2024-01-17 ENCOUNTER — Encounter (HOSPITAL_BASED_OUTPATIENT_CLINIC_OR_DEPARTMENT_OTHER): Payer: Self-pay | Admitting: Emergency Medicine

## 2024-01-17 DIAGNOSIS — M19041 Primary osteoarthritis, right hand: Secondary | ICD-10-CM | POA: Diagnosis not present

## 2024-01-17 DIAGNOSIS — S62201A Unspecified fracture of first metacarpal bone, right hand, initial encounter for closed fracture: Secondary | ICD-10-CM | POA: Diagnosis not present

## 2024-01-17 DIAGNOSIS — S60311A Abrasion of right thumb, initial encounter: Secondary | ICD-10-CM | POA: Diagnosis not present

## 2024-01-17 DIAGNOSIS — S62241A Displaced fracture of shaft of first metacarpal bone, right hand, initial encounter for closed fracture: Secondary | ICD-10-CM | POA: Diagnosis not present

## 2024-01-17 DIAGNOSIS — Y92410 Unspecified street and highway as the place of occurrence of the external cause: Secondary | ICD-10-CM | POA: Insufficient documentation

## 2024-01-17 DIAGNOSIS — S6991XA Unspecified injury of right wrist, hand and finger(s), initial encounter: Secondary | ICD-10-CM | POA: Diagnosis present

## 2024-01-17 DIAGNOSIS — S6291XA Unspecified fracture of right wrist and hand, initial encounter for closed fracture: Secondary | ICD-10-CM

## 2024-01-17 DIAGNOSIS — M25541 Pain in joints of right hand: Secondary | ICD-10-CM | POA: Diagnosis present

## 2024-01-17 MED ORDER — OXYCODONE-ACETAMINOPHEN 5-325 MG PO TABS
1.0000 | ORAL_TABLET | ORAL | Status: DC | PRN
  Administered 2024-01-17: 1 via ORAL
  Filled 2024-01-17: qty 1

## 2024-01-17 MED ORDER — ONDANSETRON 4 MG PO TBDP
4.0000 mg | ORAL_TABLET | Freq: Once | ORAL | Status: AC
  Administered 2024-01-17: 4 mg via ORAL
  Filled 2024-01-17: qty 1
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
## 2024-01-18 MED ORDER — OXYCODONE-ACETAMINOPHEN 5-325 MG PO TABS: 1.0000 | ORAL_TABLET | Freq: Four times a day (QID) | ORAL | 0 refills | Status: DC | PRN
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
## 2024-01-18 NOTE — Discharge Instructions (Addendum)
 You sustained a fracture of the first metacarpal of your right hand and were splinted/immobilized in the emergency department.  Take Tylenol  and Motrin for pain control, Percocet has been prescribed for your breakthrough pain, follow-up outpatient with hand surgery within the next week for repeat assessment.

## 2024-01-19 ENCOUNTER — Encounter: Payer: Self-pay | Admitting: Neurology

## 2024-01-19 ENCOUNTER — Ambulatory Visit: Admitting: Neurology

## 2024-01-19 VITALS — BP 113/73 | HR 68 | Ht 62.0 in | Wt 160.0 lb

## 2024-01-19 DIAGNOSIS — Z79899 Other long term (current) drug therapy: Secondary | ICD-10-CM

## 2024-01-19 DIAGNOSIS — R251 Tremor, unspecified: Secondary | ICD-10-CM

## 2024-01-19 DIAGNOSIS — R296 Repeated falls: Secondary | ICD-10-CM | POA: Diagnosis not present

## 2024-01-19 DIAGNOSIS — G2119 Other drug induced secondary parkinsonism: Secondary | ICD-10-CM

## 2024-01-19 DIAGNOSIS — Z9181 History of falling: Secondary | ICD-10-CM

## 2024-01-19 NOTE — Patient Instructions (Signed)
 It was nice to see you again today.  As discussed, you have had tremors and mild Parkinson's-like changes secondary to medication effect from the Vraylar  and other medications you have taken.  Please continue to follow-up with your psychiatrist and primary care closely.  You are at fall risk.  You have fallen several times.  Use your walker at all times.  I am not sure that you are safe to drive any longer.  Please talk to your psychiatrist and primary care about your ability to drive.   Your history and neurological exam are not telltale for essential or familial hand tremors and not classic for real or true Parkinson's disease.   Please remember to stay well-hydrated with water  and well rested.   We do not need to make a regular follow-up appointment with me.
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
## 2024-01-21 ENCOUNTER — Telehealth (HOSPITAL_BASED_OUTPATIENT_CLINIC_OR_DEPARTMENT_OTHER): Payer: Self-pay | Admitting: Emergency Medicine

## 2024-01-21 ENCOUNTER — Other Ambulatory Visit: Payer: Self-pay | Admitting: Psychiatry

## 2024-01-23 ENCOUNTER — Ambulatory Visit: Admitting: Orthopedic Surgery

## 2024-01-23 ENCOUNTER — Other Ambulatory Visit (INDEPENDENT_AMBULATORY_CARE_PROVIDER_SITE_OTHER): Payer: Self-pay

## 2024-01-23 DIAGNOSIS — S62211A Bennett's fracture, right hand, initial encounter for closed fracture: Secondary | ICD-10-CM | POA: Diagnosis not present

## 2024-01-23 DIAGNOSIS — M79644 Pain in right finger(s): Secondary | ICD-10-CM | POA: Diagnosis not present
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
## 2024-01-23 NOTE — Addendum Note (Signed)
 Addended by: Merle Whitehorn G on: 01/23/2024 03:47 PM   Modules accepted: Orders

## 2024-01-25 ENCOUNTER — Encounter: Payer: Self-pay | Admitting: Psychiatry

## 2024-01-25 ENCOUNTER — Ambulatory Visit (INDEPENDENT_AMBULATORY_CARE_PROVIDER_SITE_OTHER): Admitting: Psychiatry

## 2024-01-25 ENCOUNTER — Other Ambulatory Visit: Payer: Self-pay | Admitting: Orthopedic Surgery

## 2024-01-25 DIAGNOSIS — R251 Tremor, unspecified: Secondary | ICD-10-CM | POA: Diagnosis not present

## 2024-01-25 DIAGNOSIS — F3132 Bipolar disorder, current episode depressed, moderate: Secondary | ICD-10-CM

## 2024-01-25 DIAGNOSIS — G3184 Mild cognitive impairment, so stated: Secondary | ICD-10-CM | POA: Diagnosis not present

## 2024-01-25 DIAGNOSIS — F5105 Insomnia due to other mental disorder: Secondary | ICD-10-CM

## 2024-01-25 MED ORDER — OXYCODONE HCL 5 MG PO TABS: 5.0000 mg | ORAL_TABLET | Freq: Four times a day (QID) | ORAL | 0 refills | Status: DC | PRN

## 2024-01-25 MED ORDER — TRAZODONE HCL 100 MG PO TABS: ORAL_TABLET | ORAL | 3 refills | Status: DC
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
## 2024-01-26 DIAGNOSIS — S62211A Bennett's fracture, right hand, initial encounter for closed fracture: Secondary | ICD-10-CM | POA: Diagnosis not present

## 2024-02-08 ENCOUNTER — Ambulatory Visit: Admitting: Orthopedic Surgery

## 2024-02-08 ENCOUNTER — Other Ambulatory Visit: Payer: Self-pay

## 2024-02-08 ENCOUNTER — Other Ambulatory Visit: Payer: Self-pay | Admitting: Orthopedic Surgery

## 2024-02-08 DIAGNOSIS — S62211A Bennett's fracture, right hand, initial encounter for closed fracture: Secondary | ICD-10-CM

## 2024-02-08 MED ORDER — OXYCODONE HCL 5 MG PO TABS: 5.0000 mg | ORAL_TABLET | Freq: Four times a day (QID) | ORAL | 0 refills | Status: AC | PRN

## 2024-02-08 NOTE — Progress Notes (Signed)
   Delayni Streed - 73 y.o. female MRN [id number]  Date of birth: Feb 26, 1951  Office Visit Note: Visit Date: 02/08/2024 PCP: Chrystal Lamarr RAMAN, MD Referred by: Chrystal Lamarr RAMAN, *  Subjective:  HPI: Gustavo Meditz is a 73 y.o. female who presents today for follow up 2 weeks status post right thumb closed reduction and percutaneous pinning metacarpal fracture, intra-articular.  Doing well overall, pain is well-controlled.  Has been compliant with casting as instructed  Pertinent ROS were reviewed with the patient and found to be negative unless otherwise specified above in HPI.   Assessment & Plan: Visit Diagnoses:  1. Closed Bennett's fracture of right thumb, initial encounter     Plan: Cast removed today, pin sites are clean dry and intact.  X-rays were obtained which show stable appearance of the thumb metacarpal fracture.  Pins will remain in for additional 2 weeks.  Thumb spica cast was reapplied today.  Follow-up in 2 weeks for cast removal and splint application from occupational therapy.  Follow-up: No follow-ups on file.   Meds & Orders: No orders of the defined types were placed in this encounter.   Orders Placed This Encounter  Procedures   XR Finger Thumb Right     Procedures: No procedures performed       Objective:   Vital Signs: There were no vitals taken for this visit.  Ortho Exam Right thumb: - Pin sites remain clean dry and intact, no erythema or drainage, thumb in appropriate alignment, normal color and capillary refill to the digits, sensation intact distally  Imaging: XR Finger Thumb Right Result Date: 02/08/2024 X-rays of the right thumb demonstrate stable appearance of the thumb metacarpal fracture, pin sites remain well fixated across the fracture    [place] Hijazi Estela) Arlinda, M.D. [facility], Hand Surgery

## 2024-02-09 ENCOUNTER — Other Ambulatory Visit: Payer: Self-pay | Admitting: Psychiatry

## 2024-02-13 ENCOUNTER — Encounter: Payer: Self-pay | Admitting: Radiology

## 2024-02-16 ENCOUNTER — Other Ambulatory Visit: Payer: Self-pay | Admitting: Psychiatry

## 2024-02-16 DIAGNOSIS — F5105 Insomnia due to other mental disorder: Secondary | ICD-10-CM

## 2024-02-19 ENCOUNTER — Other Ambulatory Visit: Payer: Self-pay | Admitting: Psychiatry

## 2024-02-20 ENCOUNTER — Telehealth: Payer: Self-pay | Admitting: Psychiatry

## 2024-02-20 NOTE — Telephone Encounter (Signed)
 Pt said Vraylar  is too expensive. Called pharmacy and was told it needs a PA. There is nothing in CMM. I did not see note in Epic either.
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
## 2024-02-20 NOTE — Telephone Encounter (Signed)
 Pt Lvm 11/8 @ 11:47a stating that she went to get the Vraylar  from the pharmacy and it was $100 for 30 days.  She is asking for samples because she said it does work.  She doesn't want to try a different medication but she can't afford the $100 monthly cost.  Next appt 1/7

## 2024-02-21 ENCOUNTER — Ambulatory Visit (INDEPENDENT_AMBULATORY_CARE_PROVIDER_SITE_OTHER): Admitting: Rehabilitative and Restorative Service Providers"

## 2024-02-21 ENCOUNTER — Ambulatory Visit: Payer: Self-pay

## 2024-02-21 ENCOUNTER — Encounter: Payer: Self-pay | Admitting: Rehabilitative and Restorative Service Providers"

## 2024-02-21 ENCOUNTER — Ambulatory Visit: Admitting: Orthopedic Surgery

## 2024-02-21 DIAGNOSIS — M6281 Muscle weakness (generalized): Secondary | ICD-10-CM

## 2024-02-21 DIAGNOSIS — R600 Localized edema: Secondary | ICD-10-CM | POA: Diagnosis not present

## 2024-02-21 DIAGNOSIS — R278 Other lack of coordination: Secondary | ICD-10-CM | POA: Diagnosis not present

## 2024-02-21 DIAGNOSIS — M79641 Pain in right hand: Secondary | ICD-10-CM | POA: Diagnosis not present

## 2024-02-21 DIAGNOSIS — S62211A Bennett's fracture, right hand, initial encounter for closed fracture: Secondary | ICD-10-CM

## 2024-02-22 ENCOUNTER — Telehealth: Payer: Self-pay | Admitting: Psychiatry

## 2024-02-22 ENCOUNTER — Institutional Professional Consult (permissible substitution): Admitting: Neurology

## 2024-02-22 NOTE — Telephone Encounter (Signed)
 Pt said she still had been waiting on info for vraylar  since it was so expensive. She was told its comparable to Aripripozole and Risperidol. So she wants to know if Dr. Geoffry would write a script for either of those.    [facility] [address]

## 2024-02-23 NOTE — Telephone Encounter (Signed)
 Contacted [facility] and they have filled Vraylar  1.5 mg #30 for her 3 different times but she has had them put it back each time. The cost would be $100.00. pt does have [employer] medicare.   According to last office note she is only taking every other day now.

## 2024-02-23 NOTE — Telephone Encounter (Signed)
 Pulled 2 weeks of samples of Vraylar  and notified her.

## 2024-02-23 NOTE — Telephone Encounter (Signed)
 PA 04/13/23-04/11/25 [id number] Vryalar 1.5 mg   Pharmacist told her to contact her insurance to check what her costs will be each month or if she just needs to pay the $100.00 one time. First of the year will probably change anyway

## 2024-02-23 NOTE — Telephone Encounter (Signed)
 Continue Vraylar  for now.  Samples if necessary

## 2024-02-27 ENCOUNTER — Telehealth: Payer: Self-pay | Admitting: Rehabilitative and Restorative Service Providers"

## 2024-02-27 NOTE — Telephone Encounter (Signed)
 I called the patient at her request, and she states that her question was should I take off my orthosis to do the exercises.  The answer is yes.  She does state that she figured it out on her own, after looking at the exercises.  She was reminded of her appointment on Wednesday, she states she is having a hard time finding a ride but she should be here.
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
## 2024-02-29 ENCOUNTER — Encounter: Payer: Self-pay | Admitting: Rehabilitative and Restorative Service Providers"

## 2024-02-29 ENCOUNTER — Ambulatory Visit: Admitting: Rehabilitative and Restorative Service Providers"

## 2024-02-29 DIAGNOSIS — R600 Localized edema: Secondary | ICD-10-CM

## 2024-02-29 DIAGNOSIS — M6281 Muscle weakness (generalized): Secondary | ICD-10-CM | POA: Diagnosis not present

## 2024-02-29 DIAGNOSIS — M79641 Pain in right hand: Secondary | ICD-10-CM | POA: Diagnosis not present

## 2024-02-29 DIAGNOSIS — R278 Other lack of coordination: Secondary | ICD-10-CM

## 2024-03-13 ENCOUNTER — Ambulatory Visit: Admitting: Rehabilitative and Restorative Service Providers"

## 2024-03-13 ENCOUNTER — Encounter: Payer: Self-pay | Admitting: Rehabilitative and Restorative Service Providers"

## 2024-03-13 DIAGNOSIS — M6281 Muscle weakness (generalized): Secondary | ICD-10-CM | POA: Diagnosis not present

## 2024-03-13 DIAGNOSIS — M79641 Pain in right hand: Secondary | ICD-10-CM | POA: Diagnosis not present

## 2024-03-13 DIAGNOSIS — R278 Other lack of coordination: Secondary | ICD-10-CM | POA: Diagnosis not present

## 2024-03-13 DIAGNOSIS — R600 Localized edema: Secondary | ICD-10-CM | POA: Diagnosis not present
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
## 2024-03-20 ENCOUNTER — Ambulatory Visit: Payer: Self-pay

## 2024-03-20 ENCOUNTER — Encounter: Payer: Self-pay | Admitting: Rehabilitative and Restorative Service Providers"

## 2024-03-20 ENCOUNTER — Ambulatory Visit: Admitting: Orthopedic Surgery

## 2024-03-20 ENCOUNTER — Ambulatory Visit: Admitting: Rehabilitative and Restorative Service Providers"

## 2024-03-20 DIAGNOSIS — M79641 Pain in right hand: Secondary | ICD-10-CM

## 2024-03-20 DIAGNOSIS — R600 Localized edema: Secondary | ICD-10-CM

## 2024-03-20 DIAGNOSIS — S62211A Bennett's fracture, right hand, initial encounter for closed fracture: Secondary | ICD-10-CM

## 2024-03-20 DIAGNOSIS — M6281 Muscle weakness (generalized): Secondary | ICD-10-CM

## 2024-03-20 DIAGNOSIS — R278 Other lack of coordination: Secondary | ICD-10-CM

## 2024-03-20 NOTE — Progress Notes (Signed)
   Kaitlyn Lowe - 73 y.o. female MRN [id number]  Date of birth: 1950-07-13  Office Visit Note: Visit Date: 03/20/2024 PCP: Chrystal Lamarr RAMAN, MD Referred by: Chrystal Lamarr RAMAN, *  Subjective:  HPI: Kaitlyn Lowe is a 73 y.o. female who presents today for follow up 6 weeks status post right thumb closed reduction and percutaneous pinning metacarpal fracture, intra-articular .  Doing well overall, having some ongoing soreness throughout the right thumb.  Is progressing nicely with range of motion.  Pertinent ROS were reviewed with the patient and found to be negative unless otherwise specified above in HPI.   Assessment & Plan: Visit Diagnoses:  1. Closed Bennett's fracture of right thumb, initial encounter     Plan: She is doing well postoperatively.  He is demonstrating ongoing healing both clinically and radiographically.  Her range of motion today is quite good for the right thumb.  We can begin strengthening exercises with OT.  She expressed understanding, can begin to wean from the splint as well per OT recommendations.  Follow-up with myself in approximate 1 month.  Follow-up: No follow-ups on file.   Meds & Orders: No orders of the defined types were placed in this encounter.   Orders Placed This Encounter  Procedures   XR Finger Thumb Right     Procedures: No procedures performed       Objective:   Vital Signs: There were no vitals taken for this visit.  Ortho Exam Right thumb in appropriate alignment, mild tenderness over the fracture site of the thumb metacarpal, range of motion of the thumb is appropriate, thumb opposition to the small finger PIP, thumb pinch strength right 5, left 12  Imaging: XR Finger Thumb Right Result Date: 03/20/2024 X-rays of the right thumb demonstrate ongoing bony consolidation of the thumb metacarpal fracture with prior significant comminution.  Thumb remains in appropriate alignment.  Fracture line remains visible at the metacarpal  base region.    Kaitlyn Lowe, M.D. [facility], Hand Surgery

## 2024-03-20 NOTE — Progress Notes (Signed)
   Shaneece Stockburger - 73 y.o. female MRN [id number]  Date of birth: 1950-12-08  Office Visit Note: Visit Date: 02/21/2024 PCP: Chrystal Lamarr RAMAN, MD Referred by: Chrystal Lamarr RAMAN, *  Subjective:  HPI: Shaquitta Burbridge is a 73 y.o. female who presents today for follow up 4 weeks status post right thumb closed reduction and percutaneous pinning metacarpal fracture, intra-articular.  Doing well overall, pain is well-controlled.  Has been compliant with casting as instructed  Pertinent ROS were reviewed with the patient and found to be negative unless otherwise specified above in HPI.   Assessment & Plan: Visit Diagnoses:  1. Closed Bennett's fracture of right thumb, initial encounter     Plan: Cast removed today, pin sites are clean dry and intact.  X-rays were obtained which show stable appearance of the thumb metacarpal fracture.  Pins removed today.  Follow-up: No follow-ups on file.   Meds & Orders: No orders of the defined types were placed in this encounter.   Orders Placed This Encounter  Procedures   XR Finger Thumb Right     Procedures: No procedures performed       Objective:   Vital Signs: There were no vitals taken for this visit.  Ortho Exam Right thumb: - Pin sites remain clean dry and intact, no erythema or drainage, thumb in appropriate alignment, normal color and capillary refill to the digits, sensation intact distally  Imaging: No results found.    Mel Tadros Afton Alderton, M.D. [facility], Hand Surgery

## 2024-03-22 ENCOUNTER — Other Ambulatory Visit: Payer: Self-pay

## 2024-03-22 ENCOUNTER — Telehealth: Payer: Self-pay | Admitting: Psychiatry

## 2024-03-22 DIAGNOSIS — F5105 Insomnia due to other mental disorder: Secondary | ICD-10-CM

## 2024-03-22 MED ORDER — DIAZEPAM 10 MG PO TABS: 20.0000 mg | ORAL_TABLET | Freq: Every day | ORAL | 0 refills | Status: DC

## 2024-03-22 NOTE — Telephone Encounter (Signed)
 Pended

## 2024-03-22 NOTE — Telephone Encounter (Signed)
 Kaitlyn Lowe

## 2024-03-22 NOTE — Telephone Encounter (Signed)
 Pt Lvm @ 9:32a stating pharmacy told her to call us  about expediting her refill on Diazepam .  Pharmacy is   [id number] [place], [place] - [address] [place] [address] Phone: [phone]  Fax: [phone]  She said she only has 2 pills for tonight Next appt 1/7

## 2024-03-23 ENCOUNTER — Other Ambulatory Visit: Payer: Self-pay | Admitting: Orthopedic Surgery

## 2024-03-29 ENCOUNTER — Encounter: Admitting: Rehabilitative and Restorative Service Providers"

## 2024-04-02 ENCOUNTER — Telehealth: Payer: Self-pay | Admitting: Psychiatry

## 2024-04-02 NOTE — Telephone Encounter (Signed)
 Pulled samples and notified patient.

## 2024-04-02 NOTE — Telephone Encounter (Signed)
 Pt lvm need samples of Vraylar . Charge $100. RTC [phone]   Apt 1/7

## 2024-04-09 NOTE — Progress Notes (Unsigned)
" ° °  Maycie Luera - 73 y.o. female MRN [id number]  Date of birth: [birth year]  Office Visit Note: Visit Date: 04/10/2024 PCP: Chrystal Lamarr RAMAN, MD Referred by: Chrystal Lamarr RAMAN, MD  Subjective:  HPI: Hayli Milligan is a 73 y.o. female who presents today for follow up 9 weeks status post right thumb closed reduction and percutaneous pinning metacarpal fracture, intra-articular. Doing well overall, having ongoing soreness.  Pertinent ROS were reviewed with the patient and found to be negative unless otherwise specified above in HPI.   Assessment & Plan: Visit Diagnoses:  1. Closed Bennett's fracture of right thumb, initial encounter     Plan: She is doing well. Radiographs demonstrate appropriate healing. Range of motion at the thumb continue to improve. We will follow up with her in 3-4 weeks.  Follow-up: No follow-ups on file.   Meds & Orders: No orders of the defined types were placed in this encounter.  No orders of the defined types were placed in this encounter.    Procedures: No procedures performed       Objective:   Vital Signs: There were no vitals taken for this visit.  Ortho Exam ***  Imaging: No results found.   Anshul Afton Alderton, M.D. [facility], Hand Surgery  "

## 2024-04-10 ENCOUNTER — Other Ambulatory Visit (INDEPENDENT_AMBULATORY_CARE_PROVIDER_SITE_OTHER)

## 2024-04-10 ENCOUNTER — Ambulatory Visit: Admitting: Orthopedic Surgery

## 2024-04-10 DIAGNOSIS — S62211A Bennett's fracture, right hand, initial encounter for closed fracture: Secondary | ICD-10-CM | POA: Diagnosis not present
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
## 2024-04-16 ENCOUNTER — Ambulatory Visit: Admitting: Rehabilitative and Restorative Service Providers"

## 2024-04-16 ENCOUNTER — Encounter: Payer: Self-pay | Admitting: Rehabilitative and Restorative Service Providers"

## 2024-04-16 DIAGNOSIS — M79641 Pain in right hand: Secondary | ICD-10-CM | POA: Diagnosis not present

## 2024-04-16 DIAGNOSIS — R600 Localized edema: Secondary | ICD-10-CM | POA: Diagnosis not present

## 2024-04-16 DIAGNOSIS — R278 Other lack of coordination: Secondary | ICD-10-CM | POA: Diagnosis not present

## 2024-04-16 DIAGNOSIS — M6281 Muscle weakness (generalized): Secondary | ICD-10-CM | POA: Diagnosis not present

## 2024-04-18 ENCOUNTER — Ambulatory Visit: Admitting: Psychiatry

## 2024-04-18 ENCOUNTER — Encounter: Payer: Self-pay | Admitting: Psychiatry

## 2024-04-18 DIAGNOSIS — F5105 Insomnia due to other mental disorder: Secondary | ICD-10-CM | POA: Diagnosis not present

## 2024-04-18 DIAGNOSIS — F3132 Bipolar disorder, current episode depressed, moderate: Secondary | ICD-10-CM | POA: Diagnosis not present

## 2024-04-18 DIAGNOSIS — G3184 Mild cognitive impairment, so stated: Secondary | ICD-10-CM | POA: Diagnosis not present

## 2024-04-18 DIAGNOSIS — R251 Tremor, unspecified: Secondary | ICD-10-CM | POA: Diagnosis not present
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
## 2024-04-18 NOTE — Patient Instructions (Signed)
 Reduce Vraylar  to 1 capsule every 3 days

## 2024-04-19 ENCOUNTER — Other Ambulatory Visit: Payer: Self-pay

## 2024-04-19 ENCOUNTER — Telehealth: Payer: Self-pay | Admitting: Psychiatry

## 2024-04-19 DIAGNOSIS — F5105 Insomnia due to other mental disorder: Secondary | ICD-10-CM

## 2024-04-19 NOTE — Telephone Encounter (Signed)
 Pt requesting RF diazepam  10 mg to [facility] [address] Apt was 1/7
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
## 2024-04-23 ENCOUNTER — Encounter: Payer: Self-pay | Admitting: Rehabilitative and Restorative Service Providers"

## 2024-04-23 ENCOUNTER — Other Ambulatory Visit: Payer: Self-pay

## 2024-04-23 ENCOUNTER — Ambulatory Visit: Admitting: Rehabilitative and Restorative Service Providers"

## 2024-04-23 DIAGNOSIS — M6281 Muscle weakness (generalized): Secondary | ICD-10-CM | POA: Diagnosis not present

## 2024-04-23 DIAGNOSIS — F5105 Insomnia due to other mental disorder: Secondary | ICD-10-CM

## 2024-04-23 DIAGNOSIS — M79641 Pain in right hand: Secondary | ICD-10-CM | POA: Diagnosis not present

## 2024-04-23 DIAGNOSIS — R278 Other lack of coordination: Secondary | ICD-10-CM | POA: Diagnosis not present

## 2024-04-23 DIAGNOSIS — R600 Localized edema: Secondary | ICD-10-CM

## 2024-04-23 MED ORDER — DIAZEPAM 10 MG PO TABS: 20.0000 mg | ORAL_TABLET | Freq: Every day | ORAL | 2 refills | Status: AC

## 2024-04-23 NOTE — Telephone Encounter (Signed)
 Pended

## 2024-04-23 NOTE — Telephone Encounter (Signed)
 Pt Lvm on 1/11 asking for the same.  She said she is out of medication and forgot to ask Dr Geoffry when she had her appt last week.  Next appt 4/7

## 2024-04-26 ENCOUNTER — Ambulatory Visit (HOSPITAL_BASED_OUTPATIENT_CLINIC_OR_DEPARTMENT_OTHER): Admit: 2024-04-26 | Admitting: Orthopedic Surgery

## 2024-04-26 ENCOUNTER — Encounter (HOSPITAL_BASED_OUTPATIENT_CLINIC_OR_DEPARTMENT_OTHER): Payer: Self-pay

## 2024-04-26 SURGERY — OPEN REDUCTION INTERNAL FIXATION, FRACTURE, TOE
Anesthesia: General | Laterality: Left
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
## 2024-04-29 ENCOUNTER — Other Ambulatory Visit: Payer: Self-pay | Admitting: Psychiatry

## 2024-04-30 ENCOUNTER — Encounter: Admitting: Rehabilitative and Restorative Service Providers"
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
## 2024-05-03 ENCOUNTER — Encounter: Admitting: Rehabilitative and Restorative Service Providers"

## 2024-05-03 ENCOUNTER — Telehealth: Payer: Self-pay | Admitting: Rehabilitative and Restorative Service Providers"

## 2024-05-03 NOTE — Telephone Encounter (Signed)
 OT called patient to discuss missed appointment. We decide that she's doing well enough to d/c bc she has a good HEP, management plan, and feels that her thumb only hurts mildly due to increased cane use from recent pinning of her foot.   She will f/u with Dr Arlinda with any concerns and, in the future, can be sent back to therapy if needed with new order.

## 2024-05-04 ENCOUNTER — Encounter (HOSPITAL_COMMUNITY): Payer: Self-pay

## 2024-05-04 ENCOUNTER — Emergency Department (HOSPITAL_COMMUNITY)

## 2024-05-04 ENCOUNTER — Inpatient Hospital Stay (HOSPITAL_COMMUNITY)
Admission: EM | Admit: 2024-05-04 | Discharge: 2024-05-08 | DRG: 280 | Disposition: A | Discharge status: Home / Self Care | Attending: Internal Medicine | Admitting: Internal Medicine

## 2024-05-04 ENCOUNTER — Other Ambulatory Visit: Payer: Self-pay

## 2024-05-04 DIAGNOSIS — Z7982 Long term (current) use of aspirin: Secondary | ICD-10-CM | POA: Diagnosis not present

## 2024-05-04 DIAGNOSIS — Z79890 Hormone replacement therapy (postmenopausal): Secondary | ICD-10-CM | POA: Diagnosis not present

## 2024-05-04 DIAGNOSIS — F419 Anxiety disorder, unspecified: Secondary | ICD-10-CM | POA: Diagnosis present

## 2024-05-04 DIAGNOSIS — Z8249 Family history of ischemic heart disease and other diseases of the circulatory system: Secondary | ICD-10-CM | POA: Diagnosis not present

## 2024-05-04 DIAGNOSIS — Z791 Long term (current) use of non-steroidal anti-inflammatories (NSAID): Secondary | ICD-10-CM

## 2024-05-04 DIAGNOSIS — D72828 Other elevated white blood cell count: Secondary | ICD-10-CM | POA: Diagnosis present

## 2024-05-04 DIAGNOSIS — I249 Acute ischemic heart disease, unspecified: Secondary | ICD-10-CM

## 2024-05-04 DIAGNOSIS — F3181 Bipolar II disorder: Secondary | ICD-10-CM | POA: Diagnosis present

## 2024-05-04 DIAGNOSIS — Z7984 Long term (current) use of oral hypoglycemic drugs: Secondary | ICD-10-CM

## 2024-05-04 DIAGNOSIS — I214 Non-ST elevation (NSTEMI) myocardial infarction: Secondary | ICD-10-CM | POA: Diagnosis present

## 2024-05-04 DIAGNOSIS — I5181 Takotsubo syndrome: Secondary | ICD-10-CM | POA: Diagnosis present

## 2024-05-04 DIAGNOSIS — Z885 Allergy status to narcotic agent status: Secondary | ICD-10-CM

## 2024-05-04 DIAGNOSIS — Z818 Family history of other mental and behavioral disorders: Secondary | ICD-10-CM

## 2024-05-04 DIAGNOSIS — I5020 Unspecified systolic (congestive) heart failure: Secondary | ICD-10-CM | POA: Diagnosis not present

## 2024-05-04 DIAGNOSIS — I444 Left anterior fascicular block: Secondary | ICD-10-CM | POA: Diagnosis present

## 2024-05-04 DIAGNOSIS — I5021 Acute systolic (congestive) heart failure: Secondary | ICD-10-CM | POA: Diagnosis present

## 2024-05-04 DIAGNOSIS — Z7983 Long term (current) use of bisphosphonates: Secondary | ICD-10-CM | POA: Diagnosis not present

## 2024-05-04 DIAGNOSIS — E785 Hyperlipidemia, unspecified: Secondary | ICD-10-CM | POA: Diagnosis present

## 2024-05-04 DIAGNOSIS — Z8261 Family history of arthritis: Secondary | ICD-10-CM | POA: Diagnosis not present

## 2024-05-04 DIAGNOSIS — I2510 Atherosclerotic heart disease of native coronary artery without angina pectoris: Secondary | ICD-10-CM | POA: Diagnosis present

## 2024-05-04 DIAGNOSIS — Z90710 Acquired absence of both cervix and uterus: Secondary | ICD-10-CM

## 2024-05-04 DIAGNOSIS — E039 Hypothyroidism, unspecified: Secondary | ICD-10-CM | POA: Diagnosis present

## 2024-05-04 DIAGNOSIS — R079 Chest pain, unspecified: Secondary | ICD-10-CM | POA: Diagnosis present

## 2024-05-04 DIAGNOSIS — I080 Rheumatic disorders of both mitral and aortic valves: Secondary | ICD-10-CM | POA: Diagnosis present

## 2024-05-04 DIAGNOSIS — Z79899 Other long term (current) drug therapy: Secondary | ICD-10-CM | POA: Diagnosis not present

## 2024-05-04 LAB — CBC
HCT: 43.9 % (ref 36.0–46.0)
Hemoglobin: 14.9 g/dL (ref 12.0–15.0)
MCH: 31.4 pg (ref 26.0–34.0)
MCHC: 33.9 g/dL (ref 30.0–36.0)
MCV: 92.4 fL (ref 80.0–100.0)
Platelets: 372 K/uL (ref 150–400)
RBC: 4.75 MIL/uL (ref 3.87–5.11)
RDW: 13.2 % (ref 11.5–15.5)
WBC: 11.9 K/uL — ABNORMAL HIGH (ref 4.0–10.5)
nRBC: 0 % (ref 0.0–0.2)

## 2024-05-04 LAB — TROPONIN T, HIGH SENSITIVITY
Troponin T High Sensitivity: 515 ng/L (ref 0–19)
Troponin T High Sensitivity: 513 ng/L (ref 0–19)
Troponin T High Sensitivity: 368 ng/L (ref 0–19)

## 2024-05-04 LAB — COMPREHENSIVE METABOLIC PANEL WITH GFR
ALT: 25 U/L (ref 0–44)
AST: 44 U/L — ABNORMAL HIGH (ref 15–41)
Albumin: 4.5 g/dL (ref 3.5–5.0)
Alkaline Phosphatase: 51 U/L (ref 38–126)
Anion gap: 14 (ref 5–15)
BUN: 18 mg/dL (ref 8–23)
CO2: 23 mmol/L (ref 22–32)
Calcium: 10.7 mg/dL — ABNORMAL HIGH (ref 8.9–10.3)
Chloride: 101 mmol/L (ref 98–111)
Creatinine, Ser: 0.83 mg/dL (ref 0.44–1.00)
GFR, Estimated: >60 mL/min
Glucose, Bld: 154 mg/dL — ABNORMAL HIGH (ref 70–99)
Potassium: 4.1 mmol/L (ref 3.5–5.1)
Sodium: 138 mmol/L (ref 135–145)
Total Bilirubin: 0.5 mg/dL (ref 0.0–1.2)
Total Protein: 7.3 g/dL (ref 6.5–8.1)

## 2024-05-04 LAB — PRO BRAIN NATRIURETIC PEPTIDE: Pro Brain Natriuretic Peptide: 4966 pg/mL — ABNORMAL HIGH

## 2024-05-04 LAB — D-DIMER, QUANTITATIVE: D-Dimer, Quant: 0.43 ug{FEU}/mL (ref 0.00–0.50)

## 2024-05-04 MED ORDER — NITROGLYCERIN 0.4 MG SL SUBL
0.4000 mg | SUBLINGUAL_TABLET | SUBLINGUAL | Status: DC | PRN
  Administered 2024-05-04 – 2024-05-06 (×2): 0.4 mg via SUBLINGUAL
  Filled 2024-05-04 (×2): qty 1

## 2024-05-04 MED ORDER — ASPIRIN 81 MG PO TBEC
81.0000 mg | DELAYED_RELEASE_TABLET | Freq: Every day | ORAL | Status: DC
  Administered 2024-05-05 – 2024-05-06 (×2): 81 mg via ORAL
  Filled 2024-05-04 (×2): qty 1

## 2024-05-04 MED ORDER — DIAZEPAM 5 MG PO TABS
20.0000 mg | ORAL_TABLET | Freq: Every evening | ORAL | Status: DC | PRN
  Administered 2024-05-04 – 2024-05-07 (×4): 20 mg via ORAL
  Filled 2024-05-04 (×4): qty 4

## 2024-05-04 MED ORDER — METOPROLOL TARTRATE 25 MG PO TABS: 25.0000 mg | ORAL_TABLET | Freq: Two times a day (BID) | ORAL | Status: DC

## 2024-05-04 MED ORDER — METOPROLOL TARTRATE 25 MG PO TABS
25.0000 mg | ORAL_TABLET | Freq: Two times a day (BID) | ORAL | Status: DC
  Administered 2024-05-04 – 2024-05-05 (×2): 25 mg via ORAL
  Filled 2024-05-04 (×2): qty 1

## 2024-05-04 MED ORDER — ASPIRIN 81 MG PO TBEC
81.0000 mg | DELAYED_RELEASE_TABLET | Freq: Every day | ORAL | Status: DC
  Administered 2024-05-04: 81 mg via ORAL
  Filled 2024-05-04: qty 1

## 2024-05-04 MED ORDER — LEVOTHYROXINE SODIUM 50 MCG PO TABS
50.0000 ug | ORAL_TABLET | Freq: Every day | ORAL | Status: DC
  Administered 2024-05-05 – 2024-05-08 (×4): 50 ug via ORAL
  Filled 2024-05-04 (×4): qty 1

## 2024-05-04 MED ORDER — ATORVASTATIN CALCIUM 80 MG PO TABS
80.0000 mg | ORAL_TABLET | Freq: Every day | ORAL | Status: DC
  Administered 2024-05-04 – 2024-05-08 (×5): 80 mg via ORAL
  Filled 2024-05-04: qty 2
  Filled 2024-05-04 (×4): qty 1

## 2024-05-04 MED ORDER — HEPARIN (PORCINE) 25000 UT/250ML-% IV SOLN
850.0000 [IU]/h | INTRAVENOUS | Status: DC
  Administered 2024-05-04: 850 [IU]/h via INTRAVENOUS
  Administered 2024-05-05: 1050 [IU]/h via INTRAVENOUS
  Administered 2024-05-06 – 2024-05-07 (×2): 1250 [IU]/h via INTRAVENOUS
  Filled 2024-05-04 (×4): qty 250

## 2024-05-04 MED ORDER — DIAZEPAM 5 MG PO TABS: 10.0000 mg | ORAL_TABLET | Freq: Every evening | ORAL | Status: DC | PRN

## 2024-05-04 MED ORDER — ONDANSETRON HCL 4 MG/2ML IJ SOLN: 4.0000 mg | Freq: Four times a day (QID) | INTRAMUSCULAR | Status: DC | PRN

## 2024-05-04 MED ORDER — HYDROCODONE-ACETAMINOPHEN 5-325 MG PO TABS
1.0000 | ORAL_TABLET | Freq: Four times a day (QID) | ORAL | Status: DC | PRN
  Administered 2024-05-04 – 2024-05-08 (×7): 1 via ORAL
  Filled 2024-05-04 (×7): qty 1

## 2024-05-04 MED ORDER — HEPARIN BOLUS VIA INFUSION
4000.0000 [IU] | Freq: Once | INTRAVENOUS | Status: AC
  Administered 2024-05-04: 4000 [IU] via INTRAVENOUS
  Filled 2024-05-04: qty 4000

## 2024-05-04 MED ORDER — OXYCODONE-ACETAMINOPHEN 5-325 MG PO TABS: 1.0000 | ORAL_TABLET | Freq: Four times a day (QID) | ORAL | Status: DC | PRN

## 2024-05-04 MED ORDER — ACETAMINOPHEN 325 MG PO TABS
650.0000 mg | ORAL_TABLET | ORAL | Status: DC | PRN
  Administered 2024-05-05: 650 mg via ORAL
  Filled 2024-05-04: qty 2

## 2024-05-04 MED ORDER — ATORVASTATIN CALCIUM 40 MG PO TABS: 40.0000 mg | ORAL_TABLET | Freq: Every day | ORAL | Status: DC

## 2024-05-04 NOTE — H&P (Signed)
 " History and Physical    Patient: Kaitlyn Lowe FMW:[id number] DOB: 19-Dec-1950 DOA: 05/04/2024 DOS: the patient was seen and examined on 05/04/2024 PCP: Chrystal Lamarr RAMAN, MD  Patient coming from: Home  Chief Complaint:  Chief Complaint  Patient presents with   Chest Pain   HPI: Kaitlyn Lowe is a 74 y.o. female with medical history significant of Depression, Hypothyroidsm, history of hammertoe s/p surgery, osteoarthritis of multiple joints, who has no prior cardiac history but came to the ER complaining of chest pain that started last night.  Pain was centrally located and squeezing.  No associated activity.  Became at rest.  Some diaphoresis.  It went away and came back a few times.  It has persisted since.  Had some nausea associated with it.  Patient came to the ER for where she was evaluated.  Initial troponin was 515 and subsequent 04/17/2011.  She had a proBNP of almost 5000.  EKG shows sinus tachycardia with left anterior fascicular block.  Also noted some Q waves.  Patient suspected to have non-ST elevation MI.  Cardiology consulted.  Patient being admitted to the medical service on heparin  with plan for possible cardiac cath this admission.   Review of Systems: As mentioned in the history of present illness. All other systems reviewed and are negative. Past Medical History:  Diagnosis Date   Depression    Past Surgical History:  Procedure Laterality Date   ABDOMINAL HYSTERECTOMY  1994   BACK SURGERY     COLON SURGERY     INCISIONAL HERNIA REPAIR N/A 08/18/2020   Procedure: LAPAROSCOPIC INCISIONAL HERNIA REPAIR WITH MESH;  Surgeon: Rubin Calamity, MD;  Location: [facility] OR;  Service: General;  Laterality: N/A;   KNEE ARTHROSCOPY     bilateral knees.   Social History:  reports that she has never smoked. She has never used smokeless tobacco. She reports current alcohol use of about 5.0 standard drinks of alcohol per week. She reports that she does not use drugs.  Allergies[1]  Family  History  Problem Relation Age of Onset   Arthritis Mother    Mental illness Mother    Hypertension Mother    Transient ischemic attack Mother    Alzheimer's disease Neg Hx    Parkinson's disease Neg Hx    Tremor Neg Hx     Prior to Admission medications  Medication Sig Start Date End Date Taking? Authorizing Provider  alendronate (FOSAMAX) 70 MG tablet Take 70 mg by mouth once a week. 12/21/21   [provider]  azelastine (ASTELIN) 0.1 % nasal spray Place 1 spray into both nostrils daily as needed for rhinitis or allergies. Use in each nostril as directed    [provider]  B Complex CAPS 1 capsule.    [provider]  calcium -vitamin D (OSCAL WITH D) 500-5 MG-MCG tablet Take 1 tablet by mouth.    [provider]  cariprazine  (VRAYLAR ) 1.5 MG capsule Take 1 capsule (1.5 mg total) by mouth daily. Patient taking differently: Take 1.5 mg by mouth daily. Every other day 11/11/23   Cottle, Lorene KANDICE Raddle., MD  Cyanocobalamin  (VITAMIN B 12 PO) Take by mouth.    [provider]  diazepam  (VALIUM ) 10 MG tablet Take 2 tablets (20 mg total) by mouth at bedtime. 04/23/24   Cottle, Lorene KANDICE Raddle., MD  diclofenac Sodium (VOLTAREN ARTHRITIS PAIN) 1 % GEL Apply topically as needed.    [provider]  diphenhydrAMINE (BENADRYL) 25 MG tablet Take 25 mg  by mouth every 6 (six) hours as needed for itching.    [provider]  docusate sodium (COLACE) 100 MG capsule Take 200 mg by mouth in the morning.    [provider]  levothyroxine  (SYNTHROID ) 100 MCG tablet Take 100 mcg by mouth daily before breakfast. Patient taking differently: Take 50 mcg by mouth daily before breakfast.    [provider]  levothyroxine  (SYNTHROID ) 50 MCG tablet TAKE 1 TABLET EVERY MORNING ON AN EMPTY STOMACH 04/30/24   Cottle, Lorene KANDICE Raddle., MD  meloxicam (MOBIC) 15 MG tablet Take 15 mg by mouth daily.    [provider]  Omeprazole 20 MG TBEC Take 20 mg  by mouth daily.    [provider]  oxyCODONE  (ROXICODONE ) 5 MG immediate release tablet Take 1 tablet (5 mg total) by mouth every 6 (six) hours as needed. 02/08/24   Agarwala, Gildardo, MD  oxyCODONE -acetaminophen  (PERCOCET/ROXICET) 5-325 MG tablet Take 1 tablet by mouth every 6 (six) hours as needed for severe pain (pain score 7-10). 01/18/24   Jerrol Agent, MD  Polyethyl Glycol-Propyl Glycol (SYSTANE OP) Place 1 drop into both eyes in the morning.    [provider]  propranolol  (INDERAL ) 10 MG tablet TAKE 1 TO 2 TABLETS BY MOUTH TWICE A DAY 02/09/24   Cottle, Lorene KANDICE Raddle., MD  traMADol  (ULTRAM ) 50 MG tablet Take 50 mg by mouth every 6 (six) hours as needed.    [provider]  traZODone  (DESYREL ) 100 MG tablet TAKE 1-2 TABLETS AT NIGHT FOR SLEEP 02/16/24   Cottle, Lorene KANDICE Raddle., MD  UNABLE TO FIND Med Name: neuropathy cream    [provider]    Physical Exam: Vitals:   05/04/24 1414 05/04/24 1418 05/04/24 1630  BP:  139/87 (!) 128/91  Pulse:  (!) 113 (!) 101  Resp:  (!) 21 16  Temp:  97.6 F (36.4 C)   TempSrc:  Oral   SpO2:  100% 100%  Weight: 76.7 kg    Height: 5' 2 (1.575 m)     Constitutional: Acutely ill looking, NAD, calm, comfortable Eyes: PERRL, lids and conjunctivae normal ENMT: Mucous membranes are moist. Posterior pharynx clear of any exudate or lesions.Normal dentition.  Neck: normal, supple, no masses, no thyromegaly Respiratory: clear to auscultation bilaterally, no wheezing, no crackles. Normal respiratory effort. No accessory muscle use.  Cardiovascular: Sinus tachycardia, no murmurs / rubs / gallops. No extremity edema. 2+ pedal pulses. No carotid bruits.  Abdomen: no tenderness, no masses palpated. No hepatosplenomegaly. Bowel sounds positive.  Musculoskeletal: Good range of motion, no joint swelling or tenderness, Skin: no rashes, lesions, ulcers. No induration Neurologic: CN 2-12 grossly intact. Sensation intact, DTR normal.  Strength 5/5 in all 4.  Psychiatric: Normal judgment and insight. Alert and oriented x 3. Normal mood  Data Reviewed:  Temperature 97.6, blood pressure 128/91, pulse 130 respirate 21 oxygen sat 100% on room air.  White count is 11.9 calcium  10.7 glucose 154.  Troponin 515 and then 513.  proBNP 4966.  AST of 44.  Chest x-ray shows no acute findings.  Initial EKG shows sinus tachycardia with left anterior fascicular block and Q waves.  No significant ST changes  Assessment and Plan:  #1 NSTEMI: Patient appears to ruled in.  EKG did not show evidence of STEMI.  Chest pain is better now with nitroglycerin .  Will admit to medical service.  Cardiology consulted and will follow recommendation.  Patient also has no prior cardiac history.  Will  check fasting lipid.  Will also check TSH as well as continue medical management.  Heparin  drip aspirin  and statin.  #2 hypothyroidism: Continue with levothyroxine   #3 elevated proBNP: No prior history of CHF.  Probably due to the NSTEMI.  Patient will likely require echocardiogram prior to discharge.  #4 depression with anxiety: Will continue home regimen  #5 leukocytosis: Probably due to some inflammation.  Continue to monitor    Advance Care Planning:   Code Status: Full Code   Consults: Cardiologist Dr. Wendel  Family Communication: No family at bedside  Severity of Illness: The appropriate patient status for this patient is INPATIENT. Inpatient status is judged to be reasonable and necessary in order to provide the required intensity of service to ensure the patient's safety. The patient's presenting symptoms, physical exam findings, and initial radiographic and laboratory data in the context of their chronic comorbidities is felt to place them at high risk for further clinical deterioration. Furthermore, it is not anticipated that the patient will be medically stable for discharge from the hospital within 2 midnights of admission.   * I certify  that at the point of admission it is my clinical judgment that the patient will require inpatient hospital care spanning beyond 2 midnights from the point of admission due to high intensity of service, high risk for further deterioration and high frequency of surveillance required.*  AuthorBETHA SIM KNOLL, MD 05/04/2024 7:00 PM  For on call review [email or web address].     [1]  Allergies Allergen Reactions   Codeine Nausea Only   "

## 2024-05-04 NOTE — Progress Notes (Signed)
 Patient's name added to add on board for Monday. Per Dr. Parry request, wrote pre-cath orders to include standard oral hydration day of cath. If echo or clinical situation shows development of heart failure, rounding team will need to adjust pre-cath fluid order.
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
## 2024-05-04 NOTE — Progress Notes (Addendum)
 ANTICOAGULATION CONSULT NOTE  Pharmacy Consult for heparin  Indication: chest pain/ACS  Allergies[1]  Patient Measurements: Height: 5' 2 (157.5 cm) Weight: 76.7 kg (169 lb 1.5 oz) IBW/kg (Calculated) : 50.1 Heparin  Dosing Weight: 67 kg  Vital Signs: Temp: 97.6 F (36.4 C) (01/23 1418) Temp Source: Oral (01/23 1418) BP: 128/91 (01/23 1630) Pulse Rate: 101 (01/23 1630)  Labs: Recent Labs    05/04/24 1431 05/04/24 1457  HGB 14.9  --   HCT 43.9  --   PLT 372  --   CREATININE  --  0.83    Estimated Creatinine Clearance: 57.8 mL/min (by C-G formula based on SCr of 0.83 mg/dL).  Medical History: Past Medical History:  Diagnosis Date   Depression     Medications:  See MAR  Assessment: 46 yoF presents with chest pain relieved by nitroglycerin , elevated troponin (Pk 515).  Not on anticoagulation prior to admission.  Pharmacy consulted for heparin  dosing.  CBC stable - Hgb 14.9, Plt 372.   Goal of Therapy:  Heparin  level 0.3-0.7 units/ml Monitor platelets by anticoagulation protocol: Yes   Plan:  Heparin  bolus 4000 units, then start infusion at 850 units/hr 8 hour anti-Xa level Daily CBC, anti-Xa level Monitor for s/sx of bleeding F/u cards recs, plans for LHC (tentative Monday)  Maurilio Fila, PharmD Clinical Pharmacist 05/04/2024  6:37 PM     [1]  Allergies Allergen Reactions   Codeine Nausea Only

## 2024-05-04 NOTE — ED Provider Triage Note (Signed)
 Emergency Medicine Provider Triage Evaluation Note  Kaitlyn Lowe , a 74 y.o. female  was evaluated in triage.  Pt complains of chest pain and shortness of breath that started last night with associated nausea.  No cough or URI symptoms.  No cardiac history.  No history of PE.  Recent surgery on her left lower extremity.  Review of Systems  Positive: Negative:   Physical Exam  BP 139/87 (BP Location: Right Arm)   Pulse (!) 113   Temp 97.6 F (36.4 C) (Oral)   Resp (!) 21   SpO2 100%  Gen:   Awake, no distress   Resp:  Normal effort  MSK:   Moves extremities without difficulty  Other:    Medical Decision Making  Medically screening exam initiated at 2:29 PM.  Appropriate orders placed.  Kaitlyn Lowe was informed that the remainder of the evaluation will be completed by another provider, this initial triage assessment does not replace that evaluation, and the importance of remaining in the ED until their evaluation is complete.  Will obtain routine cardiac workup, given presenting tachycardia and recent surgical history will add on dimer   Donnajean Lynwood DEL, PA-C 05/04/24 1431

## 2024-05-04 NOTE — ED Provider Notes (Signed)
 " [facility] Provider Note   CSN: [id number] Arrival date & time: 05/04/24  1404     Patient presents with: Chest Pain   Kaitlyn Lowe is a 74 y.o. female presenting to ED with complaint of chest pain.  Patient ports abrupt onset of pressure in her chest yesterday evening when she was sitting down in her recliner.  She said has been a persistent heaviness in her chest the entire night including this morning.  She has some mild nausea.  She did eat a large meal yesterday.  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  She denies any known or significant cardiac or coronary disease.  Denies history of high blood pressure, diabetes or high cholesterol.  She says she took 4 baby aspirin 's at home before coming into the ED.  She has not had nitroglycerin .   HPI     Prior to Admission medications  Medication Sig Start Date End Date Taking? Authorizing Provider  alendronate (FOSAMAX) 70 MG tablet Take 70 mg by mouth once a week. 12/21/21   [provider]  azelastine (ASTELIN) 0.1 % nasal spray Place 1 spray into both nostrils daily as needed for rhinitis or allergies. Use in each nostril as directed    [provider]  B Complex CAPS 1 capsule.    [provider]  calcium -vitamin D (OSCAL WITH D) 500-5 MG-MCG tablet Take 1 tablet by mouth.    [provider]  cariprazine  (VRAYLAR ) 1.5 MG capsule Take 1 capsule (1.5 mg total) by mouth daily. Patient taking differently: Take 1.5 mg by mouth daily. Every other day 11/11/23   Cottle, Lorene KANDICE Raddle., MD  Cyanocobalamin  (VITAMIN B 12 PO) Take by mouth.    [provider]  diazepam  (VALIUM ) 10 MG tablet Take 2 tablets (20 mg total) by mouth at bedtime. 04/23/24   Cottle, Lorene KANDICE Raddle., MD  diclofenac Sodium (VOLTAREN ARTHRITIS PAIN) 1 % GEL Apply topically as needed.    [provider]  diphenhydrAMINE  (BENADRYL) 25 MG tablet Take 25 mg by mouth every 6 (six) hours as needed for itching.    [provider]  docusate sodium (COLACE) 100 MG capsule Take 200 mg by mouth in the morning.    [provider]  levothyroxine  (SYNTHROID ) 100 MCG tablet Take 100 mcg by mouth daily before breakfast. Patient taking differently: Take 50 mcg by mouth daily before breakfast.    [provider]  levothyroxine  (SYNTHROID ) 50 MCG tablet TAKE 1 TABLET EVERY MORNING ON AN EMPTY STOMACH 04/30/24   Cottle, Lorene KANDICE Raddle., MD  meloxicam (MOBIC) 15 MG tablet Take 15 mg by mouth daily.    [provider]  Omeprazole 20 MG TBEC Take 20 mg by mouth daily.    [provider]  oxyCODONE  (ROXICODONE ) 5 MG immediate release tablet Take 1 tablet (5 mg total) by mouth every 6 (six) hours as needed. 02/08/24   Agarwala, Gildardo, MD  oxyCODONE -acetaminophen  (PERCOCET/ROXICET) 5-325 MG tablet Take 1 tablet by mouth every 6 (six) hours as needed for severe pain (pain score 7-10). 01/18/24   Jerrol Agent, MD  Polyethyl Glycol-Propyl Glycol (SYSTANE OP) Place 1 drop into both eyes in the morning.    [provider]  propranolol  (INDERAL ) 10 MG tablet TAKE 1 TO 2 TABLETS BY MOUTH TWICE A DAY 02/09/24   Cottle, Lorene KANDICE Raddle.,  MD  traMADol  (ULTRAM ) 50 MG tablet Take 50 mg by mouth every 6 (six) hours as needed.    [provider]  traZODone  (DESYREL ) 100 MG tablet TAKE 1-2 TABLETS AT NIGHT FOR SLEEP 02/16/24   Cottle, Lorene KANDICE Raddle., MD  UNABLE TO FIND Med Name: neuropathy cream    [provider]    Allergies: Codeine    Review of Systems  Updated Vital Signs BP 139/87 (BP Location: Right Arm)   Pulse (!) 113   Temp 97.6 F (36.4 C) (Oral)   Resp (!) 21   SpO2 100%   Physical Exam Constitutional:      General: She is not in acute distress. HENT:     Head: Normocephalic and atraumatic.  Eyes:     Conjunctiva/sclera: Conjunctivae normal.     Pupils: Pupils are  equal, round, and reactive to light.  Cardiovascular:     Rate and Rhythm: Normal rate and regular rhythm.  Pulmonary:     Effort: Pulmonary effort is normal. No respiratory distress.  Abdominal:     General: There is no distension.     Tenderness: There is no abdominal tenderness.  Skin:    General: Skin is warm and dry.  Neurological:     General: No focal deficit present.     Mental Status: She is alert. Mental status is at baseline.  Psychiatric:        Mood and Affect: Mood normal.        Behavior: Behavior normal.     (all labs ordered are listed, but only abnormal results are displayed) Labs Reviewed  CBC - Abnormal; Notable for the following components:      Result Value   WBC 11.9 (*)    All other components within normal limits  COMPREHENSIVE METABOLIC PANEL WITH GFR - Abnormal; Notable for the following components:   Glucose, Bld 154 (*)    Calcium  10.7 (*)    AST 44 (*)    All other components within normal limits  PRO BRAIN NATRIURETIC PEPTIDE - Abnormal; Notable for the following components:   Pro Brain Natriuretic Peptide 4,966.0 (*)    All other components within normal limits  TROPONIN T, HIGH SENSITIVITY - Abnormal; Notable for the following components:   Troponin T High Sensitivity 515 (*)    All other components within normal limits  D-DIMER, QUANTITATIVE  TROPONIN T, HIGH SENSITIVITY    EKG: EKG Interpretation Date/Time:  Friday May 04 2024 14:12:14 EST Ventricular Rate:  113 PR Interval:  172 QRS Duration:  86 QT Interval:  328 QTC Calculation: 449 R Axis:   -50  Text Interpretation: Sinus tachycardia Possible Left atrial enlargement Left axis deviation Inferior infarct , age undetermined Anterolateral infarct , age undetermined Abnormal ECG increased rate from prior 9/20 Confirmed by Towana Sharper [phone]) on 05/04/2024 2:58:20 PM  Radiology: DG Chest 2 View Result Date: 05/04/2024 EXAM: 2 VIEW(S) XRAY OF THE CHEST 05/04/2024 03:00:00  PM COMPARISON: 01/13/2024 CLINICAL HISTORY: Chest pain. FINDINGS: LUNGS AND PLEURA: Linear left base subsegmental atelectasis or scarring, unchanged. No pleural effusion. No pneumothorax. HEART AND MEDIASTINUM: Aortic atherosclerosis. No acute abnormality of the cardiac silhouette. BONES AND SOFT TISSUES: No acute osseous abnormality. IMPRESSION: 1. No acute findings. Electronically signed by: Dayne Hassell MD 05/04/2024 03:27 PM EST RP Workstation: [id number]     .Critical Care  Performed by: Cottie Donnice PARAS, MD Authorized by: Cottie Donnice PARAS, MD   Critical care provider statement:    Critical care  time (minutes):  40   Critical care time was exclusive of:  Separately billable procedures and treating other patients   Critical care was necessary to treat or prevent imminent or life-threatening deterioration of the following conditions:  Circulatory failure   Critical care was time spent personally by me on the following activities:  Ordering and performing treatments and interventions, ordering and review of laboratory studies, ordering and review of radiographic studies, pulse oximetry, review of old charts, examination of patient and evaluation of patient's response to treatment   Care discussed with: admitting provider   Comments:     IV heparin  for NSTEMI    Medications Ordered in the ED  nitroGLYCERIN  (NITROSTAT ) SL tablet 0.4 mg (has no administration in time range)    Clinical Course as of 05/05/24 0008  Fri May 04, 2024  1817 Repeat troponin flat, cardiology consulted and will see patient, requesting heparin  and hospitalist admission, no likely cath tonight [MT]  1855 Admitted to hospitalist [MT]    Clinical Course User Index [MT] Cottie Donnice PARAS, MD                                 Medical Decision Making Amount and/or Complexity of Data Reviewed Labs: ordered. Radiology: ordered. ECG/medicine tests: ordered.  Risk Prescription drug management. Decision regarding  hospitalization.   This patient presents to the ED with concern for chest pain. This involves an extensive number of treatment options, and is a complaint that carries with it a high risk of complications and morbidity.  The differential diagnosis includes ACS versus pneumonia versus pneumothorax versus other  Co-morbidities that complicate the patient evaluation: Advanced age as a cardiovascular risk factor  I ordered and personally interpreted labs.  The pertinent results include: Elevated troponins, flat on repeat, elevated BNP  I ordered imaging studies including x-ray of the chest I independently visualized and interpreted imaging which showed no emergent findings I agree with the radiologist interpretation  The patient was maintained on a cardiac monitor.  I personally viewed and interpreted the cardiac monitored which showed an underlying rhythm of: Sinus rhythm  Per my interpretation the patient's ECG shows sinus rhythm with no STEMI  I ordered medication including IV heparin  for suspected NSTEMI; nitroglycerin  as needed for chest pain.  Patient already took full dose aspirin  at home  I have reviewed the patients home medicines and have made adjustments as needed  Test Considered: Lower suspicion for acute PE in this clinical setting.  The patient does not have hypoxia, has a negative D-dimer.  I do not think she needs an emergent CT angiogram  I requested consultation with the cardiology,  and discussed lab and imaging findings as well as pertinent plan - they recommend: See ED course  After the interventions noted above, I reevaluated the patient and found that they have: improved   Dispostion:  After consideration of the diagnostic results and the patients response to treatment, I feel that the patent would benefit from medical admission.      Final diagnoses:  None    ED Discharge Orders     None          Kase Shughart, Donnice PARAS, MD 05/05/24 0010  "

## 2024-05-04 NOTE — Progress Notes (Signed)
 [facility] night cross cover note:  Per patient's request, I have resumed her home prn hydrocodone in the setting of recent hammertoe procedure, and also have resumed her home evening valium .     Eva Pore, DO Hospitalist

## 2024-05-04 NOTE — ED Triage Notes (Addendum)
 Pt BIB EMS for chest pain and pressure starting last night. Given 324 ASA with EMS. Pain is 5/10 but got up to 9 last night. New onset nausea on arrival. Pt also increased work of breathing, very anxious.  EMS  147/97 108 HR 99% RA 18 RR

## 2024-05-05 ENCOUNTER — Inpatient Hospital Stay (HOSPITAL_COMMUNITY)

## 2024-05-05 DIAGNOSIS — I214 Non-ST elevation (NSTEMI) myocardial infarction: Secondary | ICD-10-CM

## 2024-05-05 DIAGNOSIS — I5020 Unspecified systolic (congestive) heart failure: Secondary | ICD-10-CM | POA: Diagnosis not present

## 2024-05-05 LAB — CBC
HCT: 39.3 % (ref 36.0–46.0)
Hemoglobin: 13.4 g/dL (ref 12.0–15.0)
MCH: 31.5 pg (ref 26.0–34.0)
MCHC: 34.1 g/dL (ref 30.0–36.0)
MCV: 92.5 fL (ref 80.0–100.0)
Platelets: 309 10*3/uL (ref 150–400)
RBC: 4.25 MIL/uL (ref 3.87–5.11)
RDW: 13.3 % (ref 11.5–15.5)
WBC: 8 10*3/uL (ref 4.0–10.5)
nRBC: 0.2 % (ref 0.0–0.2)

## 2024-05-05 LAB — LIPID PANEL
Cholesterol: 211 mg/dL — ABNORMAL HIGH (ref 0–200)
HDL: 54 mg/dL
LDL Cholesterol: 126 mg/dL — ABNORMAL HIGH (ref 0–99)
Total CHOL/HDL Ratio: 3.9 ratio
Triglycerides: 154 mg/dL — ABNORMAL HIGH
VLDL: 31 mg/dL (ref 0–40)

## 2024-05-05 LAB — TROPONIN T, HIGH SENSITIVITY: Troponin T High Sensitivity: 358 ng/L (ref 0–19)

## 2024-05-05 LAB — ECHOCARDIOGRAM COMPLETE
Calc EF: 26 %
Height: 63 in
S' Lateral: 4.5 cm
Single Plane A2C EF: 27 %
Single Plane A4C EF: 24.4 %
Weight: 2797.2 [oz_av]

## 2024-05-05 LAB — HEPARIN LEVEL (UNFRACTIONATED)
Heparin Unfractionated: 0.15 [IU]/mL — ABNORMAL LOW (ref 0.30–0.70)
Heparin Unfractionated: <0.1 [IU]/mL — ABNORMAL LOW (ref 0.30–0.70)

## 2024-05-05 MED ORDER — EMPAGLIFLOZIN 10 MG PO TABS
10.0000 mg | ORAL_TABLET | Freq: Every day | ORAL | Status: DC
  Administered 2024-05-06 – 2024-05-08 (×3): 10 mg via ORAL
  Filled 2024-05-05 (×3): qty 1

## 2024-05-05 MED ORDER — HEPARIN BOLUS VIA INFUSION
2000.0000 [IU] | Freq: Once | INTRAVENOUS | Status: AC
  Administered 2024-05-05: 2000 [IU] via INTRAVENOUS
  Filled 2024-05-05: qty 2000

## 2024-05-05 MED ORDER — SPIRONOLACTONE 25 MG PO TABS
25.0000 mg | ORAL_TABLET | Freq: Every day | ORAL | Status: DC
  Administered 2024-05-06: 25 mg via ORAL
  Filled 2024-05-05: qty 1

## 2024-05-05 MED ORDER — METOPROLOL SUCCINATE ER 50 MG PO TB24
50.0000 mg | ORAL_TABLET | Freq: Every day | ORAL | Status: DC
  Administered 2024-05-06: 50 mg via ORAL
  Filled 2024-05-05: qty 1

## 2024-05-05 MED ORDER — POLYETHYLENE GLYCOL 3350 17 G PO PACK
17.0000 g | PACK | Freq: Every day | ORAL | Status: DC
  Administered 2024-05-05 – 2024-05-08 (×4): 17 g via ORAL
  Filled 2024-05-05 (×4): qty 1

## 2024-05-05 MED ORDER — CARIPRAZINE HCL 1.5 MG PO CAPS
1.5000 mg | ORAL_CAPSULE | ORAL | Status: DC
  Administered 2024-05-05: 1.5 mg via ORAL
  Filled 2024-05-05: qty 1

## 2024-05-05 NOTE — Progress Notes (Signed)
 ANTICOAGULATION CONSULT NOTE  Pharmacy Consult for heparin  Indication: chest pain/ACS  Allergies[1]  Patient Measurements: Height: 5' 3 (160 cm) Weight: 79.3 kg (174 lb 13.2 oz) IBW/kg (Calculated) : 52.4 Heparin  Dosing Weight: 67 kg  Vital Signs: Temp: 97.9 F (36.6 C) (01/24 1114) Temp Source: Oral (01/24 1114) BP: 99/57 (01/24 1114) Pulse Rate: 85 (01/24 1114)  Labs: Recent Labs    05/04/24 1431 05/04/24 1457 05/05/24 0435 05/05/24 1517  HGB 14.9  --  13.4  --   HCT 43.9  --  39.3  --   PLT 372  --  309  --   HEPARINUNFRC  --   --  0.15* <0.10*  CREATININE  --  0.83  --   --     Estimated Creatinine Clearance: 60.2 mL/min (by C-G formula based on SCr of 0.83 mg/dL).  Medical History: Past Medical History:  Diagnosis Date   Depression     Medications:  See MAR  Assessment: 17 yoF presents with chest pain relieved by nitroglycerin , elevated troponin (Pk 515).  Not on anticoagulation prior to admission.  Pharmacy consulted for heparin  dosing.  Heparin  level <0.1, no bleeding issues or infusion issues per RN.  Goal of Therapy:  Heparin  level 0.3-0.7 units/ml Monitor platelets by anticoagulation protocol: Yes   Plan:  Heparin  2000 units x1 Increase heparin  to 1400 units/h Recheck heparin  level in 8h  Ozell Jamaica, PharmD, [place], [facility] Clinical Pharmacist [phone] Please check AMION for all [facility] numbers 05/05/2024       [1]  Allergies Allergen Reactions   Codeine Nausea Only
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
## 2024-05-05 NOTE — Progress Notes (Signed)
 ANTICOAGULATION CONSULT NOTE Pharmacy Consult for heparin  Indication: chest pain/ACS Brief A/P: Heparin  level subtherapeutic Increase Heparin  rate  Allergies[1]  Patient Measurements: Height: 5' 3 (160 cm) Weight: 79.3 kg (174 lb 13.2 oz) IBW/kg (Calculated) : 52.4 Heparin  Dosing Weight: 67 kg  Vital Signs: Temp: 97.6 F (36.4 C) (01/24 0406) Temp Source: Oral (01/24 0406) BP: 104/72 (01/24 0406) Pulse Rate: 88 (01/24 0406)  Labs: Recent Labs    05/04/24 1431 05/04/24 1457 05/05/24 0435  HGB 14.9  --  13.4  HCT 43.9  --  39.3  PLT 372  --  309  HEPARINUNFRC  --   --  0.15*  CREATININE  --  0.83  --     Estimated Creatinine Clearance: 60.2 mL/min (by C-G formula based on SCr of 0.83 mg/dL).  Assessment: 74 y.o. female with chest pain for heparin    Goal of Therapy:  Heparin  level 0.3-0.7 units/ml Monitor platelets by anticoagulation protocol: Yes   Plan:  Heparin  2000 units IV bolus, then increase heparin   1050 units/hr Check heparin  level in 8 hours.   Kaitlyn Lowe, PharmD, BCPS  05/05/2024  6:07 AM      [1]  Allergies Allergen Reactions   Codeine Nausea Only

## 2024-05-05 NOTE — Plan of Care (Signed)
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
## 2024-05-05 NOTE — Progress Notes (Signed)
 [facility] night cross cover note:  Per patient's request I have resumed her home cariprazine  1.5 mg po q3days, with next dose to occur now.  Patient conveys that she was due to receive this medication yesterday.    Eva Pore, DO Hospitalist

## 2024-05-06 DIAGNOSIS — I214 Non-ST elevation (NSTEMI) myocardial infarction: Secondary | ICD-10-CM | POA: Diagnosis not present

## 2024-05-06 DIAGNOSIS — I5020 Unspecified systolic (congestive) heart failure: Secondary | ICD-10-CM | POA: Diagnosis not present

## 2024-05-06 LAB — CBC
HCT: 38.9 % (ref 36.0–46.0)
Hemoglobin: 13.2 g/dL (ref 12.0–15.0)
MCH: 31.4 pg (ref 26.0–34.0)
MCHC: 33.9 g/dL (ref 30.0–36.0)
MCV: 92.6 fL (ref 80.0–100.0)
Platelets: 280 10*3/uL (ref 150–400)
RBC: 4.2 MIL/uL (ref 3.87–5.11)
RDW: 13.5 % (ref 11.5–15.5)
WBC: 9.1 10*3/uL (ref 4.0–10.5)
nRBC: 0 % (ref 0.0–0.2)

## 2024-05-06 LAB — HEPARIN LEVEL (UNFRACTIONATED)
Heparin Unfractionated: 0.85 [IU]/mL — ABNORMAL HIGH (ref 0.30–0.70)
Heparin Unfractionated: 0.55 [IU]/mL (ref 0.30–0.70)

## 2024-05-06 MED ORDER — FREE WATER
500.0000 mL | Freq: Once | Status: AC
  Administered 2024-05-07: 500 mL via ORAL

## 2024-05-06 MED ORDER — ASPIRIN 81 MG PO CHEW
81.0000 mg | CHEWABLE_TABLET | ORAL | Status: AC
  Administered 2024-05-07: 81 mg via ORAL
  Filled 2024-05-06: qty 1

## 2024-05-06 NOTE — Progress Notes (Signed)
 ANTICOAGULATION CONSULT NOTE  Pharmacy Consult for heparin  Indication: chest pain/ACS  Allergies[1]  Patient Measurements: Height: 5' 3 (160 cm) Weight: 79.3 kg (174 lb 13.2 oz) IBW/kg (Calculated) : 52.4 Heparin  Dosing Weight: 67 kg  Vital Signs: Temp: 98.5 F (36.9 C) (01/25 1209) Temp Source: Oral (01/25 1209) BP: 109/78 (01/25 1209) Pulse Rate: 96 (01/25 1209)  Labs: Recent Labs    05/04/24 1431 05/04/24 1431 05/04/24 1457 05/05/24 0435 05/05/24 1517 05/06/24 0255 05/06/24 1643  HGB 14.9  --   --  13.4  --  13.2  --   HCT 43.9  --   --  39.3  --  38.9  --   PLT 372  --   --  309  --  280  --   HEPARINUNFRC  --    < >  --  0.15* <0.10* 0.85* 0.55  CREATININE  --   --  0.83  --   --   --   --    < > = values in this interval not displayed.    Estimated Creatinine Clearance: 60.2 mL/min (by C-G formula based on SCr of 0.83 mg/dL).  Medical History: Past Medical History:  Diagnosis Date   Depression     Medications:  See MAR  Assessment: 40 yoF presents with chest pain relieved by nitroglycerin , elevated troponin (Pk 515).  Not on anticoagulation prior to admission.  Pharmacy consulted for heparin  dosing.  Heparin  level therapeutic at 0.55.  Goal of Therapy:  Heparin  level 0.3-0.7 units/ml Monitor platelets by anticoagulation protocol: Yes   Plan:  Continue heparin  1250 units/h Daily heparin  level and CBC  Ozell Jamaica, PharmD, BCPS, [facility] Clinical Pharmacist [phone] Please check AMION for all [facility] Pharmacy numbers 05/06/2024        [1]  Allergies Allergen Reactions   Codeine Nausea Only

## 2024-05-06 NOTE — Progress Notes (Signed)
 " Cardiology Consultation:   Patient ID: Kaitlyn Lowe MRN: [id number]; DOB: 1950/08/14  Admit date: 05/04/2024 Date of Consult: 05/06/2024  Primary Care Provider: Chrystal Lamarr RAMAN, MD [facility] Cardiologist: None  [facility] Electrophysiologist:  None   Patient Profile:   Kaitlyn Lowe is a 74 y.o. female with CAD, HLD and hypothyroidism who presents with chest pain now diagnosed with NSTEMI.   Interval Events:   Isolated episode of chest pain today.  Otherwise no complaints right now.  I reviewed her TTE findings with her today and explained the difference between stress-induced cardiomyopathy and obstructive CAD is a cause for her LV dysfunction.  Of note she has had significant stress over the past 2 weeks with trying to buy a camper and finding out that there was a 50,000 RR price discrepancy between what she thought she owed and what they were trying to bill her.  She is been dealing with this for the past 2 weeks but had the invoice sent to her several days ago which may have been a precipitating factor if the sense of being stress-induced cardiomyopathy.  Past Medical History:  Diagnosis Date   Depression    Past Surgical History:  Procedure Laterality Date   ABDOMINAL HYSTERECTOMY  1994   BACK SURGERY     COLON SURGERY     INCISIONAL HERNIA REPAIR N/A 08/18/2020   Procedure: LAPAROSCOPIC INCISIONAL HERNIA REPAIR WITH MESH;  Surgeon: Rubin Calamity, MD;  Location: [facility] OR;  Service: General;  Laterality: N/A;   KNEE ARTHROSCOPY     bilateral knees.    Inpatient Medications: Scheduled Meds:  aspirin  EC  81 mg Oral Daily   atorvastatin   80 mg Oral Daily   cariprazine   1.5 mg Oral Q72H   empagliflozin   10 mg Oral Daily   levothyroxine   50 mcg Oral QAC breakfast   metoprolol  succinate  50 mg Oral Daily   polyethylene glycol  17 g Oral Daily   spironolactone   25 mg Oral Daily   Continuous Infusions:  heparin  1,250 Units/hr (05/06/24 0954)   PRN  Meds: acetaminophen , diazepam , HYDROcodone -acetaminophen , nitroGLYCERIN , ondansetron  (ZOFRAN ) IV  Allergies:   Allergies[1]  Social History:   Social History   Socioeconomic History   Marital status: Divorced    Spouse name: Not on file   Number of children: Not on file   Years of education: Not on file   Highest education level: Not on file  Occupational History   Not on file  Tobacco Use   Smoking status: Never   Smokeless tobacco: Never  Vaping Use   Vaping status: Never Used  Substance and Sexual Activity   Alcohol use: Yes    Alcohol/week: 5.0 standard drinks of alcohol    Types: 2 Glasses of wine, 3 Shots of liquor per week    Comment: three times a week 2 drinks at a time.   Drug use: No   Sexual activity: Not on file  Other Topics Concern   Not on file  Social History Narrative   Pt lives alone    Retired    Social Drivers of Health   Tobacco Use: Low Risk (05/04/2024)   Patient History    Smoking Tobacco Use: Never    Smokeless Tobacco Use: Never    Passive Exposure: Not on file  Financial Resource Strain: Not on file  Food Insecurity: No Food Insecurity (05/04/2024)   Epic    Worried About Radiation Protection Practitioner of Food in the Last Year: Never true  Ran Out of Food in the Last Year: Never true  Transportation Needs: No Transportation Needs (05/04/2024)   Epic    Lack of Transportation (Medical): No    Lack of Transportation (Non-Medical): No  Physical Activity: Not on file  Stress: Not on file  Social Connections: Moderately Integrated (05/04/2024)   Social Connection and Isolation Panel    Frequency of Communication with Friends and Family: Three times a week    Frequency of Social Gatherings with Friends and Family: Once a week    Attends Religious Services: More than 4 times per year    Active Member of [employer] or Organizations: No    Attends Banker Meetings: 1 to 4 times per year    Marital Status: Divorced  Catering Manager Violence: Not At  Risk (05/04/2024)   Epic    Fear of Current or Ex-Partner: No    Emotionally Abused: No    Physically Abused: No    Sexually Abused: No  Depression (PHQ2-9): Not on file  Alcohol Screen: Not on file  Housing: Low Risk (05/04/2024)   Epic    Unable to Pay for Housing in the Last Year: No    Number of Times Moved in the Last Year: 0    Homeless in the Last Year: No  Utilities: Not At Risk (05/04/2024)   Epic    Threatened with loss of utilities: No  Health Literacy: Not on file    Family History:    Family History  Problem Relation Age of Onset   Arthritis Mother    Mental illness Mother    Hypertension Mother    Transient ischemic attack Mother    Alzheimer's disease Neg Hx    Parkinson's disease Neg Hx    Tremor Neg Hx     ROS:  Review of Systems: [y] = yes, [ ]  = no      General: Weight gain [ ] ; Weight loss [ ] ; Anorexia [ ] ; Fatigue [ ] ; Fever [ ] ; Chills [ ] ; Weakness [ ]    Cardiac: Chest pain/pressure [y]; Resting SOB [ ] ; Exertional SOB [ ] ; Orthopnea [ ] ; Pedal Edema [ ] ; Palpitations [ ] ; Syncope [ ] ; Presyncope [ ] ; Paroxysmal nocturnal dyspnea [ ]    Pulmonary: Cough [ ] ; Wheezing [ ] ; Hemoptysis [ ] ; Sputum [ ] ; Snoring [ ]    GI: Vomiting [ ] ; Dysphagia [ ] ; Melena [ ] ; Hematochezia [ ] ; Heartburn [ ] ; Abdominal pain [ ] ; Constipation [ ] ; Diarrhea [ ] ; BRBPR [ ]    GU: Hematuria [ ] ; Dysuria [ ] ; Nocturia [ ]  Vascular: Pain in legs with walking [ ] ; Pain in feet with lying flat [ ] ; Non-healing sores [ ] ; Stroke [ ] ; TIA [ ] ; Slurred speech [ ] ;   Neuro: Headaches [ ] ; Vertigo [ ] ; Seizures [ ] ; Paresthesias [ ] ;Blurred vision [ ] ; Diplopia [ ] ; Vision changes [ ]    Ortho/Skin: Arthritis [ ] ; Joint pain [ ] ; Muscle pain [ ] ; Joint swelling [ ] ; Back Pain [ ] ; Rash [ ]    Psych: Depression [ ] ; Anxiety [y]   Heme: Bleeding problems [ ] ; Clotting disorders [ ] ; Anemia [ ]    Endocrine: Diabetes [ ] ; Thyroid  dysfunction [ ]    Physical Exam/Data:   Vitals:   05/05/24  1651 05/05/24 2003 05/05/24 2317 05/06/24 0415  BP: 101/61 117/67 118/70   Pulse: 82 97 97   Resp: 16 18 18 18   Temp: 98.3 F (36.8 C)  97.9 F (36.6 C) 98 F (36.7 C) 98.4 F (36.9 C)  TempSrc: Axillary Axillary Oral Oral  SpO2: 99% 99%    Weight:      Height:       Intake/Output Summary (Last 24 hours) at 05/06/2024 1144 Last data filed at 05/06/2024 1048 Gross per 24 hour  Intake 603.09 ml  Output 1050 ml  Net -446.91 ml      05/04/2024    9:00 PM 05/04/2024    2:14 PM 01/19/2024   12:57 PM  Last 3 Weights  Weight (lbs) 174 lb 13.2 oz 169 lb 1.5 oz 160 lb  Weight (kg) 79.3 kg 76.7 kg 72.576 kg     Body mass index is 30.97 kg/m.  General: Well nourished, well developed, in no acute distress HEENT: Normal Neck: No JVD Cardiac: normal S1, S2; RRR; no murmur  Lungs: Clear to auscultation bilaterally, no wheezing, rhonchi or rales  Abd: Soft, nontender, no hepatomegaly  Ext: No edema Musculoskeletal: No deformities, BUE and BLE strength normal and equal Skin: warm and dry, L toe bandaged from hammertoe surgery 8 weeks ago  Neuro: CNs 2-12 intact, no focal abnormalities noted Psych: Normal affect    Telemetry: personally reviewed and demonstrates NSR 80-90s   Relevant CV Studies:   TTE Result date: 05/05/24  1. There is apical ballooning and hyperdynamic basal left ventricular  contractility. There is mild LV outflow tract obstruction due to  compensatory hyperdynamic contractility of the basal LV wall segment. The  peak outflow gradient is 38 mm Hg. Left  ventricular ejection fraction, by estimation, is 25 to 30%. The left  ventricle has severely decreased function. The left ventricle demonstrates  regional wall motion abnormalities (see scoring diagram/findings for  description). Indeterminate diastolic  filling due to E-A fusion.   2. Right ventricular systolic function is normal. The right ventricular  size is normal. There is normal pulmonary artery systolic  pressure. The  estimated right ventricular systolic pressure is 24.0 mmHg.   3. The mitral valve is abnormal. Mild to moderate mitral valve  regurgitation.   4. The aortic valve is tricuspid. There is mild calcification of the  aortic valve. There is mild thickening of the aortic valve. Aortic valve  regurgitation is trivial. Aortic valve sclerosis/calcification is present,  without any evidence of aortic  stenosis.   5. Aortic dilatation noted. There is mild dilatation of the aortic root,  measuring 39 mm.   6. The inferior vena cava is normal in size with greater than 50%  respiratory variability, suggesting right atrial pressure of 3 mmHg.   Laboratory Data:  Chemistry Recent Labs  Lab 05/04/24 1457  NA 138  K 4.1  CL 101  CO2 23  GLUCOSE 154*  BUN 18  CREATININE 0.83  CALCIUM  10.7*  GFRNONAA >60  ANIONGAP 14    Recent Labs  Lab 05/04/24 1457  PROT 7.3  ALBUMIN 4.5  AST 44*  ALT 25  ALKPHOS 51  BILITOT 0.5   Hematology Recent Labs  Lab 05/04/24 1431 05/05/24 0435 05/06/24 0255  WBC 11.9* 8.0 9.1  RBC 4.75 4.25 4.20  HGB 14.9 13.4 13.2  HCT 43.9 39.3 38.9  MCV 92.4 92.5 92.6  MCH 31.4 31.5 31.4  MCHC 33.9 34.1 33.9  RDW 13.2 13.3 13.5  PLT 372 309 280   BNP Recent Labs  Lab 05/04/24 1457  PROBNP 4,966.0*    DDimer  Recent Labs  Lab 05/04/24 1457  DDIMER 0.43   Radiology/Studies:  ECHOCARDIOGRAM COMPLETE Result Date: 05/05/2024    ECHOCARDIOGRAM REPORT   Patient Name:   Kaitlyn Lowe Date of Exam: 05/05/2024 Medical Rec #:  [id number]    Height:       63.0 in Accession #:    [id number]   Weight:       174.8 lb Date of Birth:  Mar 10, 1951     BSA:          1.826 m Patient Age:    73 years     BP:           104/72 mmHg Patient Gender: F            HR:           91 bpm. Exam Location:  Inpatient Procedure: 2D Echo, Cardiac Doppler and Color Doppler (Both Spectral and Color            Flow Doppler were utilized during procedure). Indications:    MI I21.9   History:        Patient has no prior history of Echocardiogram examinations.                 Signs/Symptoms:Hypertensive Heart Disease.  Sonographer:    Nathanel Devonshire Referring Phys: [id number] ARUN K THUKKANI IMPRESSIONS  1. There is apical ballooning and hyperdynamic basal left ventricular contractility. There is mild LV outflow tract obstruction due to compensatory hyperdynamic contractility of the basal LV wall segment. The peak outflow gradient is 38 mm Hg. Left ventricular ejection fraction, by estimation, is 25 to 30%. The left ventricle has severely decreased function. The left ventricle demonstrates regional wall motion abnormalities (see scoring diagram/findings for description). Indeterminate diastolic filling due to E-A fusion.  2. Right ventricular systolic function is normal. The right ventricular size is normal. There is normal pulmonary artery systolic pressure. The estimated right ventricular systolic pressure is 24.0 mmHg.  3. The mitral valve is abnormal. Mild to moderate mitral valve regurgitation.  4. The aortic valve is tricuspid. There is mild calcification of the aortic valve. There is mild thickening of the aortic valve. Aortic valve regurgitation is trivial. Aortic valve sclerosis/calcification is present, without any evidence of aortic stenosis.  5. Aortic dilatation noted. There is mild dilatation of the aortic root, measuring 39 mm.  6. The inferior vena cava is normal in size with greater than 50% respiratory variability, suggesting right atrial pressure of 3 mmHg. Comparison(s): Findings are strongly suggestive with takotsubo syndrome, but cannot exclude multivessel CAD or stenosis in a wrap-around LAD artery. Conclusion(s)/Recommendation(s): Use caution in the use of inotropes as this may worsen LV outflow obstruction. FINDINGS  Left Ventricle: There is apical ballooning and hyperdynamic basal left ventricular contractility. There is mild LV outflow tract obstruction due to  compensatory hyperdynamic contractility of the basal LV wall segment. The peak outflow gradient is 38 mm Hg. Left ventricular ejection fraction, by estimation, is 25 to 30%. The left ventricle has severely decreased function. The left ventricle demonstrates regional wall motion abnormalities. The left ventricular internal cavity size was normal in size. There is no left ventricular hypertrophy. Indeterminate diastolic filling due to E-A fusion.  LV Wall Scoring: The entire apex is dyskinetic. The mid anteroseptal segment, mid anterolateral segment, mid inferoseptal segment, and mid inferior segment are akinetic. The anterior wall, posterior wall, basal anteroseptal segment, basal anterolateral segment, basal inferior segment, and basal inferoseptal segment are normal. Right Ventricle: The right ventricular size is normal. No increase in right ventricular wall thickness. Right ventricular  systolic function is normal. There is normal pulmonary artery systolic pressure. The tricuspid regurgitant velocity is 2.29 m/s, and  with an assumed right atrial pressure of 3 mmHg, the estimated right ventricular systolic pressure is 24.0 mmHg. Left Atrium: Left atrial size was normal in size. Right Atrium: Right atrial size was normal in size. Pericardium: Trivial pericardial effusion is present. The pericardial effusion is anterior to the right ventricle. Mitral Valve: There is systolic anterior motion of the anterior mitral leaflet, with mild LVOT obstruction due to hypercontractility of the basal LV wall segment. The mitral valve is abnormal. Mild to moderate mitral valve regurgitation. Tricuspid Valve: The tricuspid valve is normal in structure. Tricuspid valve regurgitation is trivial. Aortic Valve: The aortic valve is tricuspid. There is mild calcification of the aortic valve. There is mild thickening of the aortic valve. Aortic valve regurgitation is trivial. Aortic valve sclerosis/calcification is present, without any  evidence of aortic stenosis. Pulmonic Valve: The pulmonic valve was grossly normal. Pulmonic valve regurgitation is not visualized. No evidence of pulmonic stenosis. Aorta: Aortic dilatation noted. There is mild dilatation of the aortic root, measuring 39 mm. Venous: The inferior vena cava is normal in size with greater than 50% respiratory variability, suggesting right atrial pressure of 3 mmHg. IAS/Shunts: No atrial level shunt detected by color flow Doppler.  LEFT VENTRICLE PLAX 2D LVIDd:         5.60 cm LVIDs:         4.50 cm LV PW:         0.90 cm LV IVS:        0.80 cm LVOT diam:     2.00 cm LV SV:         181 LV SV Index:   99 LVOT Area:     3.14 cm  LV Volumes (MOD) LV vol d, MOD A2C: 74.0 ml LV vol d, MOD A4C: 75.1 ml LV vol s, MOD A2C: 54.0 ml LV vol s, MOD A4C: 56.8 ml LV SV MOD A2C:     20.0 ml LV SV MOD A4C:     75.1 ml LV SV MOD BP:      19.6 ml RIGHT VENTRICLE          IVC RV Basal diam:  2.90 cm  IVC diam: 1.60 cm TAPSE (M-mode): 1.7 cm LEFT ATRIUM           Index        RIGHT ATRIUM          Index LA diam:      2.60 cm 1.42 cm/m   RA Area:     6.86 cm LA Vol (A2C): 31.7 ml 17.36 ml/m  RA Volume:   12.60 ml 6.90 ml/m LA Vol (A4C): 18.2 ml 9.97 ml/m  AORTIC VALVE              PULMONIC VALVE LVOT Vmax:   283.50 cm/s  PV Vmax:       0.78 m/s LVOT Vmean:  187.000 cm/s PV Peak grad:  2.5 mmHg LVOT VTI:    0.578 m  AORTA Ao Root diam: 3.90 cm TRICUSPID VALVE TR Peak grad:   21.0 mmHg TR Vmax:        229.00 cm/s  SHUNTS Systemic VTI:  0.58 m Systemic Diam: 2.00 cm Jerel Croitoru MD Electronically signed by Jerel Balding MD Signature Date/Time: 05/05/2024/12:57:16 PM    Final    DG Chest 2 View Result Date: 05/04/2024 EXAM: 2 VIEW(S) XRAY OF THE  CHEST 05/04/2024 03:00:00 PM COMPARISON: 01/13/2024 CLINICAL HISTORY: Chest pain. FINDINGS: LUNGS AND PLEURA: Linear left base subsegmental atelectasis or scarring, unchanged. No pleural effusion. No pneumothorax. HEART AND MEDIASTINUM: Aortic  atherosclerosis. No acute abnormality of the cardiac silhouette. BONES AND SOFT TISSUES: No acute osseous abnormality. IMPRESSION: 1. No acute findings. Electronically signed by: Dayne Hassell MD 05/04/2024 03:27 PM EST RP Workstation: [id number]   Assessment and Plan:  Kaitlyn Lowe is a 74 y.o. female with CAD, HLD and hypothyroidism who presents with chest pain now diagnosed with NSTEMI.    NSTEMI TTE with apical ballooning  One episode of isolated chest pain. TTE consistent with either Takotsubo cardiomyopathy or wraparound LAD.  On further discussion today she has had significant stressors over the past 2 weeks that could have been a precipitating factor in stress-induced cardiomyopathy.  Tolerating GDMT. - consolidate lopressor  25 mg bid to toprol  XL 50 mg daily 01/25 AM, if HR remains in the 80-90s 01/25 then will uptitrate to toprol  XL 100 mg daily 01/26 - start spiro 25 mg daily 01/25 AM  - start Jardiance  10 mg daily 01/25 AM  - hold off on starting Entresto  until post coronary evaluation  - will need to price check Jardiance  and Entresto  prior to discharge  - continue ASA 81 mg daily - continue atorva 80 mg daily  - continue heparin  gtt pending until cath   For questions or updates, please contact [facility] Please consult [email or web address] for contact info under   Signed, Donnice DELENA Primus, MD  05/06/2024 11:44 AM     [1]  Allergies Allergen Reactions   Codeine Nausea Only   "

## 2024-05-06 NOTE — Progress Notes (Signed)
 " PROGRESS NOTE  Kaitlyn Lowe  FMW:[id number] DOB: 17-Sep-1950 DOA: 05/04/2024 PCP: Chrystal Lamarr RAMAN, MD   Brief Narrative: Patient is a 74 year old female with history of depression, anxiety, hypothyroidism osteoarthritis who initially presented with chest pain.  Pain was mainly in the central chest and described as squeezing in nature.  Elevated troponin as per lab, elevated BNP.  X-ray did not show any acute findings.  EKG showed sinus tachycardia with left anterior fascicular block, anterolateral Q waves.  Patient admitted for further management of NSTEMI, cardiology consulted.  Started on heparin  drip.  Echo showed diminished EF.  Plan for cardiac cath tomorrow  Assessment & Plan:  Principal Problem:   NSTEMI (non-ST elevated myocardial infarction) [facility]) Active Problems:   Acquired hypothyroidism   Bipolar II disorder (HCC)   Hypercalcemia   NSTEMI: Presented with chest pain, elevated troponin.  Cardiology consulted and following.  Also on heparin  drip.  No history of prior cardiac disease.  Cardiology considering cardiac cath on Monday.  Started  on aspirin , Lipitor, metoprolol . Developed some chest pain early this morning, resolved with nitroglycerin .  Acute HFrEF: Elevated pro BNP.Echo showed apical ballooning, EF of 25 to 30%, wall motion abnormalities, normal right ventricular function, normal pulmonary artery systolic pressure.  Possibility of Takotsubo syndrome but cannot exclude multivessel coronary disease causing ischemic cardiomyopathy.  Appears euvolemic today. Started on aspirin , Jardiance , metoprolol   Hypothyroidism: Continue Synthyroid  Hyperlipidemia: LDL of 126.  Started on statin  Depression/anxiety: Continue home medications  Leukocytosis: Likely reactive.  Resolved      DVT prophylaxis:IV heparin      Code Status: Full Code  Family Communication: None at the bedside  Patient status:Inpatient  Patient is from :Home  Anticipated discharge  un:Ynfz  Estimated DC date:after full work up   Consultants: Cardiology  Procedures:None yet  Antimicrobials:  Anti-infectives (From admission, onward)    None       Subjective: Patient seen and examined at bedside today.  She was very anxious this morning.  She had some chest pain earlier this morning of  the same kind  which she presented with.  Plan for cardiac cath tomorrow.  Chest pain had resolved during my evaluation after she was given nitroglycerin   Objective: Vitals:   05/05/24 1651 05/05/24 2003 05/05/24 2317 05/06/24 0415  BP: 101/61 117/67 118/70   Pulse: 82 97 97   Resp: 16 18 18 18   Temp: 98.3 F (36.8 C) 97.9 F (36.6 C) 98 F (36.7 C) 98.4 F (36.9 C)  TempSrc: Axillary Axillary Oral Oral  SpO2: 99% 99%    Weight:      Height:        Intake/Output Summary (Last 24 hours) at 05/06/2024 1047 Last data filed at 05/06/2024 0416 Gross per 24 hour  Intake 603.09 ml  Output 400 ml  Net 203.09 ml   Filed Weights   05/04/24 1414 05/04/24 2100  Weight: 76.7 kg 79.3 kg    Examination:  General exam: Overall comfortable, not in distress,pleasant lady HEENT: PERRL Respiratory system:  no wheezes or crackles  Cardiovascular system: S1 & S2 heard, RRR.  Gastrointestinal system: Abdomen is nondistended, soft and nontender. Central nervous system: Alert and oriented Extremities: No edema, no clubbing ,no cyanosis Skin: No rashes, no ulcers,no icterus     Data Reviewed: I have personally reviewed following labs and imaging studies  CBC: Recent Labs  Lab 05/04/24 1431 05/05/24 0435 05/06/24 0255  WBC 11.9* 8.0 9.1  HGB 14.9 13.4 13.2  HCT  43.9 39.3 38.9  MCV 92.4 92.5 92.6  PLT 372 309 280   Basic Metabolic Panel: Recent Labs  Lab 05/04/24 1457  NA 138  K 4.1  CL 101  CO2 23  GLUCOSE 154*  BUN 18  CREATININE 0.83  CALCIUM  10.7*     No results found for this or any previous visit (from the past 240 hours).   Radiology  Studies: ECHOCARDIOGRAM COMPLETE Result Date: 05/05/2024    ECHOCARDIOGRAM REPORT   Patient Name:   Kaitlyn Lowe Date of Exam: 05/05/2024 Medical Rec #:  [id number]    Height:       63.0 in Accession #:    [id number]   Weight:       174.8 lb Date of Birth:  06/07/1950     BSA:          1.826 m Patient Age:    73 years     BP:           104/72 mmHg Patient Gender: F            HR:           91 bpm. Exam Location:  Inpatient Procedure: 2D Echo, Cardiac Doppler and Color Doppler (Both Spectral and Color            Flow Doppler were utilized during procedure). Indications:    MI I21.9  History:        Patient has no prior history of Echocardiogram examinations.                 Signs/Symptoms:Hypertensive Heart Disease.  Sonographer:    Nathanel Devonshire Referring Phys: [id number] ARUN K THUKKANI IMPRESSIONS  1. There is apical ballooning and hyperdynamic basal left ventricular contractility. There is mild LV outflow tract obstruction due to compensatory hyperdynamic contractility of the basal LV wall segment. The peak outflow gradient is 38 mm Hg. Left ventricular ejection fraction, by estimation, is 25 to 30%. The left ventricle has severely decreased function. The left ventricle demonstrates regional wall motion abnormalities (see scoring diagram/findings for description). Indeterminate diastolic filling due to E-A fusion.  2. Right ventricular systolic function is normal. The right ventricular size is normal. There is normal pulmonary artery systolic pressure. The estimated right ventricular systolic pressure is 24.0 mmHg.  3. The mitral valve is abnormal. Mild to moderate mitral valve regurgitation.  4. The aortic valve is tricuspid. There is mild calcification of the aortic valve. There is mild thickening of the aortic valve. Aortic valve regurgitation is trivial. Aortic valve sclerosis/calcification is present, without any evidence of aortic stenosis.  5. Aortic dilatation noted. There is mild dilatation of the aortic  root, measuring 39 mm.  6. The inferior vena cava is normal in size with greater than 50% respiratory variability, suggesting right atrial pressure of 3 mmHg. Comparison(s): Findings are strongly suggestive with takotsubo syndrome, but cannot exclude multivessel CAD or stenosis in a wrap-around LAD artery. Conclusion(s)/Recommendation(s): Use caution in the use of inotropes as this may worsen LV outflow obstruction. FINDINGS  Left Ventricle: There is apical ballooning and hyperdynamic basal left ventricular contractility. There is mild LV outflow tract obstruction due to compensatory hyperdynamic contractility of the basal LV wall segment. The peak outflow gradient is 38 mm Hg. Left ventricular ejection fraction, by estimation, is 25 to 30%. The left ventricle has severely decreased function. The left ventricle demonstrates regional wall motion abnormalities. The left ventricular internal cavity size was normal in size. There is no left ventricular  hypertrophy. Indeterminate diastolic filling due to E-A fusion.  LV Wall Scoring: The entire apex is dyskinetic. The mid anteroseptal segment, mid anterolateral segment, mid inferoseptal segment, and mid inferior segment are akinetic. The anterior wall, posterior wall, basal anteroseptal segment, basal anterolateral segment, basal inferior segment, and basal inferoseptal segment are normal. Right Ventricle: The right ventricular size is normal. No increase in right ventricular wall thickness. Right ventricular systolic function is normal. There is normal pulmonary artery systolic pressure. The tricuspid regurgitant velocity is 2.29 m/s, and  with an assumed right atrial pressure of 3 mmHg, the estimated right ventricular systolic pressure is 24.0 mmHg. Left Atrium: Left atrial size was normal in size. Right Atrium: Right atrial size was normal in size. Pericardium: Trivial pericardial effusion is present. The pericardial effusion is anterior to the right ventricle.  Mitral Valve: There is systolic anterior motion of the anterior mitral leaflet, with mild LVOT obstruction due to hypercontractility of the basal LV wall segment. The mitral valve is abnormal. Mild to moderate mitral valve regurgitation. Tricuspid Valve: The tricuspid valve is normal in structure. Tricuspid valve regurgitation is trivial. Aortic Valve: The aortic valve is tricuspid. There is mild calcification of the aortic valve. There is mild thickening of the aortic valve. Aortic valve regurgitation is trivial. Aortic valve sclerosis/calcification is present, without any evidence of aortic stenosis. Pulmonic Valve: The pulmonic valve was grossly normal. Pulmonic valve regurgitation is not visualized. No evidence of pulmonic stenosis. Aorta: Aortic dilatation noted. There is mild dilatation of the aortic root, measuring 39 mm. Venous: The inferior vena cava is normal in size with greater than 50% respiratory variability, suggesting right atrial pressure of 3 mmHg. IAS/Shunts: No atrial level shunt detected by color flow Doppler.  LEFT VENTRICLE PLAX 2D LVIDd:         5.60 cm LVIDs:         4.50 cm LV PW:         0.90 cm LV IVS:        0.80 cm LVOT diam:     2.00 cm LV SV:         181 LV SV Index:   99 LVOT Area:     3.14 cm  LV Volumes (MOD) LV vol d, MOD A2C: 74.0 ml LV vol d, MOD A4C: 75.1 ml LV vol s, MOD A2C: 54.0 ml LV vol s, MOD A4C: 56.8 ml LV SV MOD A2C:     20.0 ml LV SV MOD A4C:     75.1 ml LV SV MOD BP:      19.6 ml RIGHT VENTRICLE          IVC RV Basal diam:  2.90 cm  IVC diam: 1.60 cm TAPSE (M-mode): 1.7 cm LEFT ATRIUM           Index        RIGHT ATRIUM          Index LA diam:      2.60 cm 1.42 cm/m   RA Area:     6.86 cm LA Vol (A2C): 31.7 ml 17.36 ml/m  RA Volume:   12.60 ml 6.90 ml/m LA Vol (A4C): 18.2 ml 9.97 ml/m  AORTIC VALVE              PULMONIC VALVE LVOT Vmax:   283.50 cm/s  PV Vmax:       0.78 m/s LVOT Vmean:  187.000 cm/s PV Peak grad:  2.5 mmHg LVOT VTI:    0.578 m  AORTA Ao Root  diam: 3.90 cm TRICUSPID VALVE TR Peak grad:   21.0 mmHg TR Vmax:        229.00 cm/s  SHUNTS Systemic VTI:  0.58 m Systemic Diam: 2.00 cm Jerel Croitoru MD Electronically signed by Jerel Balding MD Signature Date/Time: 05/05/2024/12:57:16 PM    Final    DG Chest 2 View Result Date: 05/04/2024 EXAM: 2 VIEW(S) XRAY OF THE CHEST 05/04/2024 03:00:00 PM COMPARISON: 01/13/2024 CLINICAL HISTORY: Chest pain. FINDINGS: LUNGS AND PLEURA: Linear left base subsegmental atelectasis or scarring, unchanged. No pleural effusion. No pneumothorax. HEART AND MEDIASTINUM: Aortic atherosclerosis. No acute abnormality of the cardiac silhouette. BONES AND SOFT TISSUES: No acute osseous abnormality. IMPRESSION: 1. No acute findings. Electronically signed by: Dayne Hassell MD 05/04/2024 03:27 PM EST RP Workstation: [id number]    Scheduled Meds:  aspirin  EC  81 mg Oral Daily   atorvastatin   80 mg Oral Daily   cariprazine   1.5 mg Oral Q72H   empagliflozin   10 mg Oral Daily   levothyroxine   50 mcg Oral QAC breakfast   metoprolol  succinate  50 mg Oral Daily   polyethylene glycol  17 g Oral Daily   spironolactone   25 mg Oral Daily   Continuous Infusions:  heparin  1,250 Units/hr (05/06/24 0954)     LOS: 2 days   Ivonne Mustache, MD [facility] P1/25/2026, 10:47 AM  "

## 2024-05-06 NOTE — Progress Notes (Signed)
 ANTICOAGULATION CONSULT NOTE  Pharmacy Consult for heparin  Indication: chest pain/ACS  Allergies[1]  Patient Measurements: Height: 5' 3 (160 cm) Weight: 79.3 kg (174 lb 13.2 oz) IBW/kg (Calculated) : 52.4 Heparin  Dosing Weight: 67 kg  Vital Signs: Temp: 98.4 F (36.9 C) (01/25 0415) Temp Source: Oral (01/25 0415) BP: 118/70 (01/24 2317) Pulse Rate: 97 (01/24 2317)  Labs: Recent Labs    05/04/24 1431 05/04/24 1457 05/05/24 0435 05/05/24 1517 05/06/24 0255  HGB 14.9  --  13.4  --  13.2  HCT 43.9  --  39.3  --  38.9  PLT 372  --  309  --  280  HEPARINUNFRC  --   --  0.15* <0.10* 0.85*  CREATININE  --  0.83  --   --   --     Estimated Creatinine Clearance: 60.2 mL/min (by C-G formula based on SCr of 0.83 mg/dL).  Medical History: Past Medical History:  Diagnosis Date   Depression     Medications:  See MAR  Assessment: 29 yoF presents with chest pain relieved by nitroglycerin , elevated troponin (Pk 515).  Not on anticoagulation prior to admission.  Pharmacy consulted for heparin  dosing.  1/25 AM update:  Heparin  level supra-therapeutic   Goal of Therapy:  Heparin  level 0.3-0.7 units/ml Monitor platelets by anticoagulation protocol: Yes   Plan:  Dec heparin  to 1250 units/hr Re-check heparin  level in 8 hours   Lynwood Mckusick, PharmD, BCPS Clinical Pharmacist Phone: [phone]         [1]  Allergies Allergen Reactions   Codeine Nausea Only

## 2024-05-06 NOTE — H&P (View-Only) (Signed)
 " Cardiology Consultation:   Patient ID: Kaitlyn Lowe MRN: [id number]; DOB: 1950/08/14  Admit date: 05/04/2024 Date of Consult: 05/06/2024  Primary Care Provider: Chrystal Lamarr RAMAN, MD [facility] Cardiologist: None  [facility] Electrophysiologist:  None   Patient Profile:   Kaitlyn Lowe is a 74 y.o. female with CAD, HLD and hypothyroidism who presents with chest pain now diagnosed with NSTEMI.   Interval Events:   Isolated episode of chest pain today.  Otherwise no complaints right now.  I reviewed her TTE findings with her today and explained the difference between stress-induced cardiomyopathy and obstructive CAD is a cause for her LV dysfunction.  Of note she has had significant stress over the past 2 weeks with trying to buy a camper and finding out that there was a 50,000 RR price discrepancy between what she thought she owed and what they were trying to bill her.  She is been dealing with this for the past 2 weeks but had the invoice sent to her several days ago which may have been a precipitating factor if the sense of being stress-induced cardiomyopathy.  Past Medical History:  Diagnosis Date   Depression    Past Surgical History:  Procedure Laterality Date   ABDOMINAL HYSTERECTOMY  1994   BACK SURGERY     COLON SURGERY     INCISIONAL HERNIA REPAIR N/A 08/18/2020   Procedure: LAPAROSCOPIC INCISIONAL HERNIA REPAIR WITH MESH;  Surgeon: Rubin Calamity, MD;  Location: [facility] OR;  Service: General;  Laterality: N/A;   KNEE ARTHROSCOPY     bilateral knees.    Inpatient Medications: Scheduled Meds:  aspirin  EC  81 mg Oral Daily   atorvastatin   80 mg Oral Daily   cariprazine   1.5 mg Oral Q72H   empagliflozin   10 mg Oral Daily   levothyroxine   50 mcg Oral QAC breakfast   metoprolol  succinate  50 mg Oral Daily   polyethylene glycol  17 g Oral Daily   spironolactone   25 mg Oral Daily   Continuous Infusions:  heparin  1,250 Units/hr (05/06/24 0954)   PRN  Meds: acetaminophen , diazepam , HYDROcodone -acetaminophen , nitroGLYCERIN , ondansetron  (ZOFRAN ) IV  Allergies:   Allergies[1]  Social History:   Social History   Socioeconomic History   Marital status: Divorced    Spouse name: Not on file   Number of children: Not on file   Years of education: Not on file   Highest education level: Not on file  Occupational History   Not on file  Tobacco Use   Smoking status: Never   Smokeless tobacco: Never  Vaping Use   Vaping status: Never Used  Substance and Sexual Activity   Alcohol use: Yes    Alcohol/week: 5.0 standard drinks of alcohol    Types: 2 Glasses of wine, 3 Shots of liquor per week    Comment: three times a week 2 drinks at a time.   Drug use: No   Sexual activity: Not on file  Other Topics Concern   Not on file  Social History Narrative   Pt lives alone    Retired    Social Drivers of Health   Tobacco Use: Low Risk (05/04/2024)   Patient History    Smoking Tobacco Use: Never    Smokeless Tobacco Use: Never    Passive Exposure: Not on file  Financial Resource Strain: Not on file  Food Insecurity: No Food Insecurity (05/04/2024)   Epic    Worried About Radiation Protection Practitioner of Food in the Last Year: Never true  Ran Out of Food in the Last Year: Never true  Transportation Needs: No Transportation Needs (05/04/2024)   Epic    Lack of Transportation (Medical): No    Lack of Transportation (Non-Medical): No  Physical Activity: Not on file  Stress: Not on file  Social Connections: Moderately Integrated (05/04/2024)   Social Connection and Isolation Panel    Frequency of Communication with Friends and Family: Three times a week    Frequency of Social Gatherings with Friends and Family: Once a week    Attends Religious Services: More than 4 times per year    Active Member of [employer] or Organizations: No    Attends Banker Meetings: 1 to 4 times per year    Marital Status: Divorced  Catering Manager Violence: Not At  Risk (05/04/2024)   Epic    Fear of Current or Ex-Partner: No    Emotionally Abused: No    Physically Abused: No    Sexually Abused: No  Depression (PHQ2-9): Not on file  Alcohol Screen: Not on file  Housing: Low Risk (05/04/2024)   Epic    Unable to Pay for Housing in the Last Year: No    Number of Times Moved in the Last Year: 0    Homeless in the Last Year: No  Utilities: Not At Risk (05/04/2024)   Epic    Threatened with loss of utilities: No  Health Literacy: Not on file    Family History:    Family History  Problem Relation Age of Onset   Arthritis Mother    Mental illness Mother    Hypertension Mother    Transient ischemic attack Mother    Alzheimer's disease Neg Hx    Parkinson's disease Neg Hx    Tremor Neg Hx     ROS:  Review of Systems: [y] = yes, [ ]  = no      General: Weight gain [ ] ; Weight loss [ ] ; Anorexia [ ] ; Fatigue [ ] ; Fever [ ] ; Chills [ ] ; Weakness [ ]    Cardiac: Chest pain/pressure [y]; Resting SOB [ ] ; Exertional SOB [ ] ; Orthopnea [ ] ; Pedal Edema [ ] ; Palpitations [ ] ; Syncope [ ] ; Presyncope [ ] ; Paroxysmal nocturnal dyspnea [ ]    Pulmonary: Cough [ ] ; Wheezing [ ] ; Hemoptysis [ ] ; Sputum [ ] ; Snoring [ ]    GI: Vomiting [ ] ; Dysphagia [ ] ; Melena [ ] ; Hematochezia [ ] ; Heartburn [ ] ; Abdominal pain [ ] ; Constipation [ ] ; Diarrhea [ ] ; BRBPR [ ]    GU: Hematuria [ ] ; Dysuria [ ] ; Nocturia [ ]  Vascular: Pain in legs with walking [ ] ; Pain in feet with lying flat [ ] ; Non-healing sores [ ] ; Stroke [ ] ; TIA [ ] ; Slurred speech [ ] ;   Neuro: Headaches [ ] ; Vertigo [ ] ; Seizures [ ] ; Paresthesias [ ] ;Blurred vision [ ] ; Diplopia [ ] ; Vision changes [ ]    Ortho/Skin: Arthritis [ ] ; Joint pain [ ] ; Muscle pain [ ] ; Joint swelling [ ] ; Back Pain [ ] ; Rash [ ]    Psych: Depression [ ] ; Anxiety [y]   Heme: Bleeding problems [ ] ; Clotting disorders [ ] ; Anemia [ ]    Endocrine: Diabetes [ ] ; Thyroid  dysfunction [ ]    Physical Exam/Data:   Vitals:   05/05/24  1651 05/05/24 2003 05/05/24 2317 05/06/24 0415  BP: 101/61 117/67 118/70   Pulse: 82 97 97   Resp: 16 18 18 18   Temp: 98.3 F (36.8 C)  97.9 F (36.6 C) 98 F (36.7 C) 98.4 F (36.9 C)  TempSrc: Axillary Axillary Oral Oral  SpO2: 99% 99%    Weight:      Height:       Intake/Output Summary (Last 24 hours) at 05/06/2024 1144 Last data filed at 05/06/2024 1048 Gross per 24 hour  Intake 603.09 ml  Output 1050 ml  Net -446.91 ml      05/04/2024    9:00 PM 05/04/2024    2:14 PM 01/19/2024   12:57 PM  Last 3 Weights  Weight (lbs) 174 lb 13.2 oz 169 lb 1.5 oz 160 lb  Weight (kg) 79.3 kg 76.7 kg 72.576 kg     Body mass index is 30.97 kg/m.  General: Well nourished, well developed, in no acute distress HEENT: Normal Neck: No JVD Cardiac: normal S1, S2; RRR; no murmur  Lungs: Clear to auscultation bilaterally, no wheezing, rhonchi or rales  Abd: Soft, nontender, no hepatomegaly  Ext: No edema Musculoskeletal: No deformities, BUE and BLE strength normal and equal Skin: warm and dry, L toe bandaged from hammertoe surgery 8 weeks ago  Neuro: CNs 2-12 intact, no focal abnormalities noted Psych: Normal affect    Telemetry: personally reviewed and demonstrates NSR 80-90s   Relevant CV Studies:   TTE Result date: 05/05/24  1. There is apical ballooning and hyperdynamic basal left ventricular  contractility. There is mild LV outflow tract obstruction due to  compensatory hyperdynamic contractility of the basal LV wall segment. The  peak outflow gradient is 38 mm Hg. Left  ventricular ejection fraction, by estimation, is 25 to 30%. The left  ventricle has severely decreased function. The left ventricle demonstrates  regional wall motion abnormalities (see scoring diagram/findings for  description). Indeterminate diastolic  filling due to E-A fusion.   2. Right ventricular systolic function is normal. The right ventricular  size is normal. There is normal pulmonary artery systolic  pressure. The  estimated right ventricular systolic pressure is 24.0 mmHg.   3. The mitral valve is abnormal. Mild to moderate mitral valve  regurgitation.   4. The aortic valve is tricuspid. There is mild calcification of the  aortic valve. There is mild thickening of the aortic valve. Aortic valve  regurgitation is trivial. Aortic valve sclerosis/calcification is present,  without any evidence of aortic  stenosis.   5. Aortic dilatation noted. There is mild dilatation of the aortic root,  measuring 39 mm.   6. The inferior vena cava is normal in size with greater than 50%  respiratory variability, suggesting right atrial pressure of 3 mmHg.   Laboratory Data:  Chemistry Recent Labs  Lab 05/04/24 1457  NA 138  K 4.1  CL 101  CO2 23  GLUCOSE 154*  BUN 18  CREATININE 0.83  CALCIUM  10.7*  GFRNONAA >60  ANIONGAP 14    Recent Labs  Lab 05/04/24 1457  PROT 7.3  ALBUMIN 4.5  AST 44*  ALT 25  ALKPHOS 51  BILITOT 0.5   Hematology Recent Labs  Lab 05/04/24 1431 05/05/24 0435 05/06/24 0255  WBC 11.9* 8.0 9.1  RBC 4.75 4.25 4.20  HGB 14.9 13.4 13.2  HCT 43.9 39.3 38.9  MCV 92.4 92.5 92.6  MCH 31.4 31.5 31.4  MCHC 33.9 34.1 33.9  RDW 13.2 13.3 13.5  PLT 372 309 280   BNP Recent Labs  Lab 05/04/24 1457  PROBNP 4,966.0*    DDimer  Recent Labs  Lab 05/04/24 1457  DDIMER 0.43   Radiology/Studies:  ECHOCARDIOGRAM COMPLETE Result Date: 05/05/2024    ECHOCARDIOGRAM REPORT   Patient Name:   DONNIE GEDEON Date of Exam: 05/05/2024 Medical Rec #:  [id number]    Height:       63.0 in Accession #:    [id number]   Weight:       174.8 lb Date of Birth:  Mar 10, 1951     BSA:          1.826 m Patient Age:    73 years     BP:           104/72 mmHg Patient Gender: F            HR:           91 bpm. Exam Location:  Inpatient Procedure: 2D Echo, Cardiac Doppler and Color Doppler (Both Spectral and Color            Flow Doppler were utilized during procedure). Indications:    MI I21.9   History:        Patient has no prior history of Echocardiogram examinations.                 Signs/Symptoms:Hypertensive Heart Disease.  Sonographer:    Nathanel Devonshire Referring Phys: [id number] ARUN K THUKKANI IMPRESSIONS  1. There is apical ballooning and hyperdynamic basal left ventricular contractility. There is mild LV outflow tract obstruction due to compensatory hyperdynamic contractility of the basal LV wall segment. The peak outflow gradient is 38 mm Hg. Left ventricular ejection fraction, by estimation, is 25 to 30%. The left ventricle has severely decreased function. The left ventricle demonstrates regional wall motion abnormalities (see scoring diagram/findings for description). Indeterminate diastolic filling due to E-A fusion.  2. Right ventricular systolic function is normal. The right ventricular size is normal. There is normal pulmonary artery systolic pressure. The estimated right ventricular systolic pressure is 24.0 mmHg.  3. The mitral valve is abnormal. Mild to moderate mitral valve regurgitation.  4. The aortic valve is tricuspid. There is mild calcification of the aortic valve. There is mild thickening of the aortic valve. Aortic valve regurgitation is trivial. Aortic valve sclerosis/calcification is present, without any evidence of aortic stenosis.  5. Aortic dilatation noted. There is mild dilatation of the aortic root, measuring 39 mm.  6. The inferior vena cava is normal in size with greater than 50% respiratory variability, suggesting right atrial pressure of 3 mmHg. Comparison(s): Findings are strongly suggestive with takotsubo syndrome, but cannot exclude multivessel CAD or stenosis in a wrap-around LAD artery. Conclusion(s)/Recommendation(s): Use caution in the use of inotropes as this may worsen LV outflow obstruction. FINDINGS  Left Ventricle: There is apical ballooning and hyperdynamic basal left ventricular contractility. There is mild LV outflow tract obstruction due to  compensatory hyperdynamic contractility of the basal LV wall segment. The peak outflow gradient is 38 mm Hg. Left ventricular ejection fraction, by estimation, is 25 to 30%. The left ventricle has severely decreased function. The left ventricle demonstrates regional wall motion abnormalities. The left ventricular internal cavity size was normal in size. There is no left ventricular hypertrophy. Indeterminate diastolic filling due to E-A fusion.  LV Wall Scoring: The entire apex is dyskinetic. The mid anteroseptal segment, mid anterolateral segment, mid inferoseptal segment, and mid inferior segment are akinetic. The anterior wall, posterior wall, basal anteroseptal segment, basal anterolateral segment, basal inferior segment, and basal inferoseptal segment are normal. Right Ventricle: The right ventricular size is normal. No increase in right ventricular wall thickness. Right ventricular  systolic function is normal. There is normal pulmonary artery systolic pressure. The tricuspid regurgitant velocity is 2.29 m/s, and  with an assumed right atrial pressure of 3 mmHg, the estimated right ventricular systolic pressure is 24.0 mmHg. Left Atrium: Left atrial size was normal in size. Right Atrium: Right atrial size was normal in size. Pericardium: Trivial pericardial effusion is present. The pericardial effusion is anterior to the right ventricle. Mitral Valve: There is systolic anterior motion of the anterior mitral leaflet, with mild LVOT obstruction due to hypercontractility of the basal LV wall segment. The mitral valve is abnormal. Mild to moderate mitral valve regurgitation. Tricuspid Valve: The tricuspid valve is normal in structure. Tricuspid valve regurgitation is trivial. Aortic Valve: The aortic valve is tricuspid. There is mild calcification of the aortic valve. There is mild thickening of the aortic valve. Aortic valve regurgitation is trivial. Aortic valve sclerosis/calcification is present, without any  evidence of aortic stenosis. Pulmonic Valve: The pulmonic valve was grossly normal. Pulmonic valve regurgitation is not visualized. No evidence of pulmonic stenosis. Aorta: Aortic dilatation noted. There is mild dilatation of the aortic root, measuring 39 mm. Venous: The inferior vena cava is normal in size with greater than 50% respiratory variability, suggesting right atrial pressure of 3 mmHg. IAS/Shunts: No atrial level shunt detected by color flow Doppler.  LEFT VENTRICLE PLAX 2D LVIDd:         5.60 cm LVIDs:         4.50 cm LV PW:         0.90 cm LV IVS:        0.80 cm LVOT diam:     2.00 cm LV SV:         181 LV SV Index:   99 LVOT Area:     3.14 cm  LV Volumes (MOD) LV vol d, MOD A2C: 74.0 ml LV vol d, MOD A4C: 75.1 ml LV vol s, MOD A2C: 54.0 ml LV vol s, MOD A4C: 56.8 ml LV SV MOD A2C:     20.0 ml LV SV MOD A4C:     75.1 ml LV SV MOD BP:      19.6 ml RIGHT VENTRICLE          IVC RV Basal diam:  2.90 cm  IVC diam: 1.60 cm TAPSE (M-mode): 1.7 cm LEFT ATRIUM           Index        RIGHT ATRIUM          Index LA diam:      2.60 cm 1.42 cm/m   RA Area:     6.86 cm LA Vol (A2C): 31.7 ml 17.36 ml/m  RA Volume:   12.60 ml 6.90 ml/m LA Vol (A4C): 18.2 ml 9.97 ml/m  AORTIC VALVE              PULMONIC VALVE LVOT Vmax:   283.50 cm/s  PV Vmax:       0.78 m/s LVOT Vmean:  187.000 cm/s PV Peak grad:  2.5 mmHg LVOT VTI:    0.578 m  AORTA Ao Root diam: 3.90 cm TRICUSPID VALVE TR Peak grad:   21.0 mmHg TR Vmax:        229.00 cm/s  SHUNTS Systemic VTI:  0.58 m Systemic Diam: 2.00 cm Jerel Croitoru MD Electronically signed by Jerel Balding MD Signature Date/Time: 05/05/2024/12:57:16 PM    Final    DG Chest 2 View Result Date: 05/04/2024 EXAM: 2 VIEW(S) XRAY OF THE  CHEST 05/04/2024 03:00:00 PM COMPARISON: 01/13/2024 CLINICAL HISTORY: Chest pain. FINDINGS: LUNGS AND PLEURA: Linear left base subsegmental atelectasis or scarring, unchanged. No pleural effusion. No pneumothorax. HEART AND MEDIASTINUM: Aortic  atherosclerosis. No acute abnormality of the cardiac silhouette. BONES AND SOFT TISSUES: No acute osseous abnormality. IMPRESSION: 1. No acute findings. Electronically signed by: Dayne Hassell MD 05/04/2024 03:27 PM EST RP Workstation: [id number]   Assessment and Plan:  Kaitlyn Lowe is a 74 y.o. female with CAD, HLD and hypothyroidism who presents with chest pain now diagnosed with NSTEMI.    NSTEMI TTE with apical ballooning  One episode of isolated chest pain. TTE consistent with either Takotsubo cardiomyopathy or wraparound LAD.  On further discussion today she has had significant stressors over the past 2 weeks that could have been a precipitating factor in stress-induced cardiomyopathy.  Tolerating GDMT. - consolidate lopressor  25 mg bid to toprol  XL 50 mg daily 01/25 AM, if HR remains in the 80-90s 01/25 then will uptitrate to toprol  XL 100 mg daily 01/26 - start spiro 25 mg daily 01/25 AM  - start Jardiance  10 mg daily 01/25 AM  - hold off on starting Entresto  until post coronary evaluation  - will need to price check Jardiance  and Entresto  prior to discharge  - continue ASA 81 mg daily - continue atorva 80 mg daily  - continue heparin  gtt pending until cath   For questions or updates, please contact [facility] Please consult [email or web address] for contact info under   Signed, Donnice DELENA Primus, MD  05/06/2024 11:44 AM     [1]  Allergies Allergen Reactions   Codeine Nausea Only   "

## 2024-05-06 NOTE — TOC CM/SW Note (Signed)
 Transition of Care [facility]) - Inpatient Brief Assessment   Patient Details  Name: Kaitlyn Lowe MRN: [id number] Date of Birth: 11-01-1950  Transition of Care [facility]) CM/SW Contact:    Tom-Johnson, Harvest Muskrat, RN Phone Number: 05/06/2024, 11:51 AM   Clinical Narrative:  Patient presented to the ED with Chest Pain and newly diagnosed with NSTEMI. Plan for Cardiac Cath tomorrow 05/07/24. Currently on Heparin  gtt, Cardiology following.   No ICM needs or recommendations noted at this time.  Patient not Medically ready for discharge.  CM will continue to follow as patient progresses with care towards discharge.         Transition of Care Asessment:

## 2024-05-06 NOTE — Plan of Care (Signed)

## 2024-05-07 ENCOUNTER — Encounter (HOSPITAL_COMMUNITY): Payer: Self-pay | Admitting: Cardiology

## 2024-05-07 ENCOUNTER — Encounter (HOSPITAL_COMMUNITY): Admission: EM | Disposition: A | Payer: Self-pay | Source: Home / Self Care | Attending: Internal Medicine

## 2024-05-07 ENCOUNTER — Encounter: Admitting: Rehabilitative and Restorative Service Providers"

## 2024-05-07 ENCOUNTER — Encounter: Admitting: Orthopedic Surgery

## 2024-05-07 ENCOUNTER — Other Ambulatory Visit (HOSPITAL_COMMUNITY): Payer: Self-pay

## 2024-05-07 ENCOUNTER — Telehealth (HOSPITAL_COMMUNITY): Payer: Self-pay

## 2024-05-07 DIAGNOSIS — I5181 Takotsubo syndrome: Secondary | ICD-10-CM

## 2024-05-07 DIAGNOSIS — I214 Non-ST elevation (NSTEMI) myocardial infarction: Secondary | ICD-10-CM | POA: Diagnosis not present

## 2024-05-07 LAB — CBC
HCT: 37 % (ref 36.0–46.0)
Hemoglobin: 12.2 g/dL (ref 12.0–15.0)
MCH: 31.3 pg (ref 26.0–34.0)
MCHC: 33 g/dL (ref 30.0–36.0)
MCV: 94.9 fL (ref 80.0–100.0)
Platelets: 259 10*3/uL (ref 150–400)
RBC: 3.9 MIL/uL (ref 3.87–5.11)
RDW: 13.6 % (ref 11.5–15.5)
WBC: 7.5 10*3/uL (ref 4.0–10.5)
nRBC: 0 % (ref 0.0–0.2)

## 2024-05-07 LAB — BASIC METABOLIC PANEL WITH GFR
Anion gap: 10 (ref 5–15)
BUN: 24 mg/dL — ABNORMAL HIGH (ref 8–23)
CO2: 24 mmol/L (ref 22–32)
Calcium: 9.8 mg/dL (ref 8.9–10.3)
Chloride: 104 mmol/L (ref 98–111)
Creatinine, Ser: 0.91 mg/dL (ref 0.44–1.00)
GFR, Estimated: >60 mL/min
Glucose, Bld: 111 mg/dL — ABNORMAL HIGH (ref 70–99)
Potassium: 4.5 mmol/L (ref 3.5–5.1)
Sodium: 138 mmol/L (ref 135–145)

## 2024-05-07 LAB — HEPARIN LEVEL (UNFRACTIONATED): Heparin Unfractionated: 0.71 [IU]/mL — ABNORMAL HIGH (ref 0.30–0.70)

## 2024-05-07 MED ORDER — HEPARIN SODIUM (PORCINE) 1000 UNIT/ML IJ SOLN
INTRAMUSCULAR | Status: AC
  Filled 2024-05-07: qty 10

## 2024-05-07 MED ORDER — METOPROLOL SUCCINATE ER 25 MG PO TB24
25.0000 mg | ORAL_TABLET | Freq: Every day | ORAL | Status: DC
  Administered 2024-05-07 – 2024-05-08 (×2): 25 mg via ORAL
  Filled 2024-05-07 (×2): qty 1

## 2024-05-07 MED ORDER — SODIUM CHLORIDE 0.9% FLUSH
3.0000 mL | Freq: Two times a day (BID) | INTRAVENOUS | Status: DC
  Administered 2024-05-07 (×2): 3 mL via INTRAVENOUS

## 2024-05-07 MED ORDER — SACUBITRIL-VALSARTAN 24-26 MG PO TABS
1.0000 | ORAL_TABLET | Freq: Two times a day (BID) | ORAL | Status: DC
  Administered 2024-05-07 – 2024-05-08 (×3): 1 via ORAL
  Filled 2024-05-07 (×3): qty 1

## 2024-05-07 MED ORDER — FENTANYL CITRATE (PF) 100 MCG/2ML IJ SOLN
INTRAMUSCULAR | Status: DC | PRN
  Administered 2024-05-07: 25 ug via INTRAVENOUS

## 2024-05-07 MED ORDER — LABETALOL HCL 5 MG/ML IV SOLN: 10.0000 mg | INTRAVENOUS | Status: DC | PRN

## 2024-05-07 MED ORDER — HYDRALAZINE HCL 20 MG/ML IJ SOLN: 10.0000 mg | INTRAMUSCULAR | Status: DC | PRN

## 2024-05-07 MED ORDER — SODIUM CHLORIDE 0.9% FLUSH: 3.0000 mL | INTRAVENOUS | Status: DC | PRN

## 2024-05-07 MED ORDER — SPIRONOLACTONE 12.5 MG HALF TABLET
12.5000 mg | ORAL_TABLET | Freq: Every day | ORAL | Status: DC
  Administered 2024-05-07 – 2024-05-08 (×2): 12.5 mg via ORAL
  Filled 2024-05-07 (×2): qty 1

## 2024-05-07 MED ORDER — IOHEXOL 350 MG/ML SOLN
INTRAVENOUS | Status: DC | PRN
  Administered 2024-05-07: 20 mL

## 2024-05-07 MED ORDER — HEPARIN (PORCINE) IN NACL 1000-0.9 UT/500ML-% IV SOLN
INTRAVENOUS | Status: DC | PRN
  Administered 2024-05-07 (×2): 500 mL

## 2024-05-07 MED ORDER — HEPARIN SODIUM (PORCINE) 1000 UNIT/ML IJ SOLN
INTRAMUSCULAR | Status: DC | PRN
  Administered 2024-05-07: 4000 [IU] via INTRAVENOUS

## 2024-05-07 MED ORDER — ASPIRIN 81 MG PO TBEC: 81.0000 mg | DELAYED_RELEASE_TABLET | Freq: Every day | ORAL | Status: DC

## 2024-05-07 MED ORDER — VERAPAMIL HCL 2.5 MG/ML IV SOLN
INTRAVENOUS | Status: AC
  Filled 2024-05-07: qty 2

## 2024-05-07 MED ORDER — MIDAZOLAM HCL (PF) 2 MG/2ML IJ SOLN
INTRAMUSCULAR | Status: DC | PRN
  Administered 2024-05-07: 1 mg via INTRAVENOUS

## 2024-05-07 MED ORDER — FREE WATER
250.0000 mL | Freq: Once | Status: AC
  Administered 2024-05-07: 250 mL via ORAL

## 2024-05-07 MED ORDER — LIDOCAINE HCL (PF) 1 % IJ SOLN
INTRAMUSCULAR | Status: DC | PRN
  Administered 2024-05-07: 2 mL

## 2024-05-07 MED ORDER — VERAPAMIL HCL 2.5 MG/ML IV SOLN
INTRAVENOUS | Status: DC | PRN
  Administered 2024-05-07: 10 mL via INTRA_ARTERIAL

## 2024-05-07 MED ORDER — LIDOCAINE HCL (PF) 1 % IJ SOLN
INTRAMUSCULAR | Status: AC
  Filled 2024-05-07: qty 30

## 2024-05-07 MED ORDER — HEPARIN SODIUM (PORCINE) 5000 UNIT/ML IJ SOLN
5000.0000 [IU] | Freq: Three times a day (TID) | INTRAMUSCULAR | Status: DC
  Administered 2024-05-07 – 2024-05-08 (×2): 5000 [IU] via SUBCUTANEOUS
  Filled 2024-05-07 (×2): qty 1

## 2024-05-07 MED ORDER — MIDAZOLAM HCL 2 MG/2ML IJ SOLN
INTRAMUSCULAR | Status: AC
  Filled 2024-05-07: qty 2

## 2024-05-07 MED ORDER — FENTANYL CITRATE (PF) 100 MCG/2ML IJ SOLN
INTRAMUSCULAR | Status: AC
  Filled 2024-05-07: qty 2

## 2024-05-07 MED ORDER — SODIUM CHLORIDE 0.9 % IV SOLN: 250.0000 mL | INTRAVENOUS | Status: AC | PRN

## 2024-05-07 NOTE — Progress Notes (Addendum)
 ANTICOAGULATION CONSULT NOTE  Pharmacy Consult for heparin  Indication: chest pain/ACS  Allergies[1]  Patient Measurements: Height: 5' 3 (160 cm) Weight: 79.3 kg (174 lb 13.2 oz) IBW/kg (Calculated) : 52.4 Heparin  Dosing Weight: 67 kg  Vital Signs: Temp: 98.4 F (36.9 C) (01/26 0349) Temp Source: Oral (01/26 0349) BP: 101/59 (01/26 0349) Pulse Rate: 53 (01/26 0349)  Labs: Recent Labs    05/04/24 1457 05/05/24 0435 05/05/24 1517 05/06/24 0255 05/06/24 1643 05/07/24 0250  HGB  --  13.4  --  13.2  --  12.2  HCT  --  39.3  --  38.9  --  37.0  PLT  --  309  --  280  --  259  HEPARINUNFRC  --  0.15*   < > 0.85* 0.55 0.71*  CREATININE 0.83  --   --   --   --  0.91   < > = values in this interval not displayed.    Estimated Creatinine Clearance: 54.9 mL/min (by C-G formula based on SCr of 0.91 mg/dL).  Medical History: Past Medical History:  Diagnosis Date   Depression     Medications:  See MAR  Assessment: 65 yoF presents with chest pain relieved by nitroglycerin , elevated troponin (Pk 515).  Not on anticoagulation prior to admission.  Pharmacy consulted for heparin  dosing.  Heparin  level is slightly supratherapeutic at 0.71, on 1250 units/hr. Hgb 12.2, plt 259. No s/sx of bleeding or infusion issues.   Goal of Therapy:  Heparin  level 0.3-0.7 units/ml Monitor platelets by anticoagulation protocol: Yes   Plan:  Reduce heparin  infusion to 1200 units/h Daily heparin  level and CBC Follow up after cath  ADDENDUM  Cath finding stress induced cardiomyopathy - will discontinue heparin  post-cath and start DVT prophylaxis.  Thank you for allowing pharmacy to participate in this patient's care,  Suzen Sour, PharmD, BCCCP Clinical Pharmacist  Phone: [phone] 05/07/2024 7:35 AM  Please check AMION for all [facility] Pharmacy phone numbers After 10:00 PM, call Main Pharmacy [phone]       [1]  Allergies Allergen Reactions   Codeine Nausea Only

## 2024-05-07 NOTE — Plan of Care (Signed)

## 2024-05-07 NOTE — Progress Notes (Signed)
" ° °  Heart Failure Stewardship Pharmacist Progress Note   PCP: Chrystal Lamarr RAMAN, MD PCP-Cardiologist: None    HPI:  74 yo F with PMH of depression and anxiety.   Presented to the ED on 1/23 with chest pain and nausea. EKG demonstrates sinus tachycardia with left anterior fascicular block anterolateral Q waves no ischemic changes. Troponin was 515 with a second being 513. proBNP was 4966. CXR demonstrated no acute cardiopulmonary findings. Has undergone increased stress over the last two weeks.  ECHO 1/24 with LVEF 25-30%, apical ballooning, mild LVOT, RWMA, RV normal, mild to moderate MR. Taken for [facility] on 1/26 and found to have normal coronary arteries with no CAD, stress induced cardiomyopathy. LVEDP 32.  Current HF Medications: Beta Blocker: metoprolol  XL 50 mg daily MRA: spironolactone  25 mg daily SGLT2i: Jardiance  10 mg daily  Prior to admission HF Medications: None  Pertinent Lab Values: Serum creatinine 0.91, BUN 24, Potassium 4.5, Sodium 138, proBNP 4966   Vital Signs: Weight: 174 lbs (admission weight: 174 lbs) Blood pressure: 100/70s  Heart rate: 70s  I/O: net -3.1L yesterday; net -2.5L since admission  Medication Assistance / Insurance Benefits Check: Does the patient have prescription insurance?  Yes Type of insurance plan: [employer]  Does the patient qualify for medication assistance through manufacturers or grants?   Pending Eligible grants and/or patient assistance programs: pending Medication assistance applications in progress: none  Medication assistance applications approved: none Approved medication assistance renewals will be completed by: pending  Outpatient Pharmacy:  Prior to admission outpatient [facility] Is the patient willing to use [facility] TOC pharmacy at discharge? Yes Is the patient willing to transition their outpatient pharmacy to utilize a [facility] outpatient pharmacy?   Pending    Assessment: 1. Acute systolic CHF (LVEF  25-30%), due to Takotsubo cardiomyopathy. NYHA class II symptoms. - Volume status ok - Continue metoprolol  XL 50 mg daily - BP soft, can consider ARB/Entresto  at follow up if BP improves - Continue spironolactone  25 mg daily  - Continue Jardiance  10 mg daily   Plan: 1) Medication changes recommended at this time: - Add ARB vs Entresto  at follow up  2) Patient assistance: - Farxiga/Jardiance  copay $40 Entresto  copay $10  3)  Education  - Patient has been educated on current HF medications and potential additions to HF medication regimen - Patient verbalizes understanding that over the next few months, these medication doses may change and more medications may be added to optimize HF regimen - Patient has been educated on basic disease state pathophysiology and goals of therapy   Duwaine Plant, PharmD, BCPS Heart Failure Stewardship Pharmacist Phone [phone]   "

## 2024-05-07 NOTE — Progress Notes (Signed)
 Heart Failure Nurse Navigator Progress Note  PCP: Chrystal Lamarr RAMAN, MD PCP-Cardiologist: None Admission Diagnosis: NSTEMI  Admitted from: Home via EMS  Presentation:   Kaitlyn Lowe presented with chest pain that started the evening before, new nausea, BP 147/97, HR 108. Pro BNP 4,966, Troponin 515. EKG Sinus tachycardia Possible Left atrial enlargement Left axis deviation Inferior infarct , age undetermined Anterolateral infarct , Patient admitted for NSTEMI with suspicions of Takotsubo cardiomyopathy.  Has new HFrEF.Per MD she has had significant stress over the past 2 weeks with trying to buy a camper and finding out that there was a 50,000 RR price discrepancy between what she thought she owed and what they were trying to bill her. She is been dealing with this for the past 2 weeks but had the invoice sent to her several days ago which may have been a precipitating factor if the sense of being stress-induced cardiomyopathy.  Cardiac catheterization which should no evidence of CAD.   Patient was educated on the sign and symptoms of heart failure, daily weights, when to call her doctor or go to the ED. Diet/ fluid restrictions, taking all medications as prescribed and attending all medical appointments. Patient verbalized her understanding of all education. A HF TOC appointment was scheduled on 05/11/2024 @ 11 am. ( Per patients request to have it this week before the weather hits again)     ECHO/ LVEF: 25-30% New  Clinical Course:  Past Medical History:  Diagnosis Date   Depression      Social History   Socioeconomic History   Marital status: Divorced    Spouse name: Not on file   Number of children: Not on file   Years of education: Not on file   Highest education level: Not on file  Occupational History   Not on file  Tobacco Use   Smoking status: Never   Smokeless tobacco: Never  Vaping Use   Vaping status: Never Used  Substance and Sexual Activity   Alcohol use: Yes     Alcohol/week: 5.0 standard drinks of alcohol    Types: 2 Glasses of wine, 3 Shots of liquor per week    Comment: three times a week 2 drinks at a time.   Drug use: No   Sexual activity: Not on file  Other Topics Concern   Not on file  Social History Narrative   Pt lives alone    Retired    Social Drivers of Health   Tobacco Use: Low Risk (05/04/2024)   Patient History    Smoking Tobacco Use: Never    Smokeless Tobacco Use: Never    Passive Exposure: Not on file  Financial Resource Strain: Not on file  Food Insecurity: No Food Insecurity (05/04/2024)   Epic    Worried About Programme Researcher, Broadcasting/film/video in the Last Year: Never true    Ran Out of Food in the Last Year: Never true  Transportation Needs: No Transportation Needs (05/04/2024)   Epic    Lack of Transportation (Medical): No    Lack of Transportation (Non-Medical): No  Physical Activity: Not on file  Stress: Not on file  Social Connections: Moderately Integrated (05/04/2024)   Social Connection and Isolation Panel    Frequency of Communication with Friends and Family: Three times a week    Frequency of Social Gatherings with Friends and Family: Once a week    Attends Religious Services: More than 4 times per year    Active Member of [employer] or Organizations:  No    Attends Club or Organization Meetings: 1 to 4 times per year    Marital Status: Divorced  Depression (PHQ2-9): Not on file  Alcohol Screen: Not on file  Housing: Low Risk (05/04/2024)   Epic    Unable to Pay for Housing in the Last Year: No    Number of Times Moved in the Last Year: 0    Homeless in the Last Year: No  Utilities: Not At Risk (05/04/2024)   Epic    Threatened with loss of utilities: No  Health Literacy: Not on file   Education Assessment and Provision:  Detailed education and instructions provided on heart failure disease management including the following:  Signs and symptoms of Heart Failure When to call the physician Importance of daily  weights Low sodium diet Fluid restriction Medication management Anticipated future follow-up appointments  Patient education given on each of the above topics.  Patient acknowledges understanding via teach back method and acceptance of all instructions.  Education Materials:  Living Better With Heart Failure Booklet, HF zone tool, & Daily Weight Tracker Tool.  Patient has scale at home: yes Patient has pill box at home: yes    High Risk Criteria for Readmission and/or Poor Patient Outcomes: Heart failure hospital admissions (last 6 months): 0  No Show rate: 7 %  Difficult social situation: No Demonstrates medication adherence: Yes Primary Language: English  Literacy level: Reading, writing, and comprehension   Barriers of Care:   Diet/ fluid restrictions ( some salt usage)  Daily weights  Considerations/Referrals:   Referral made to Heart Failure Pharmacist Stewardship: yes Referral made to Heart Failure CSW/NCM TOC: NA Referral made to Heart & Vascular TOC clinic: Yes, 05/11/2024 @ 11 am   Items for Follow-up on DC/TOC: Continued HF education Diet/ fluid restrictions/ daily weights   Stephane Haddock, BSN, RN Heart Failure Teacher, Adult Education Only

## 2024-05-07 NOTE — Interval H&P Note (Signed)
 History and Physical Interval Note:  05/07/2024 7:56 AM  Kaitlyn Lowe  has presented today for surgery, with the diagnosis of NSTEMI.  The various methods of treatment have been discussed with the patient and family. After consideration of risks, benefits and other options for treatment, the patient has consented to  Procedures: LEFT HEART CATH AND CORONARY ANGIOGRAPHY (N/A) as a surgical intervention.  The patient's history has been reviewed, patient examined, no change in status, stable for surgery.  I have reviewed the patient's chart and labs.  Questions were answered to the patient's satisfaction.     Jonel Sick J Jackalynn Art

## 2024-05-07 NOTE — Telephone Encounter (Signed)
 Pharmacy Patient Advocate Encounter  Insurance verification completed.    The patient is insured through [employer]. Patient has Medicare and is not eligible for a copay card, but may be able to apply for patient assistance or Medicare RX Payment Plan (Patient Must reach out to their plan, if eligible for payment plan), if available.    Ran test claim for Farxiga 10mg  tablet and the current 30 day co-pay is $40.  Ran test claim for Jardiance  10mg  tablet and the current 30 day co-pay is $40.  Ran test claim for Entresto  24-26mg  tablet and the current 30 day co-pay is $10.  This test claim was processed through [employer]- copay amounts may vary at other pharmacies due to [employer], or as the patient moves through the different stages of their insurance plan.

## 2024-05-07 NOTE — Evaluation (Signed)
 Physical Therapy Evaluation Patient Details Name: Kaitlyn Lowe MRN: [id number] DOB: 30-May-1950 Today's Date: 05/07/2024  History of Present Illness  Pt is 74 year old presented to [facility] on  05/04/24 for chest pain. Pt with NSTEMI and acute HFrEF. PMH - lt hammer toe surgery 03/2024, rt thumb surgery, anxiety, depression, osteoarthritis  Clinical Impression  Pt admitted with above diagnosis and presents to PT with functional limitations due to deficits listed below (See PT problem list). Pt needs skilled PT to maximize independence and safety. Pt with eight weeks of reduced activity at home due to hammer toe repair on lt foot. Now has had several day hospitalization with further restricted activity leaving her deconditioned. Expect she will make good progress now that she is mobilizing. Recommend HHPT. Limited support at home.           If plan is discharge home, recommend the following: Help with stairs or ramp for entrance;Assist for transportation;Assistance with cooking/housework;A little help with walking and/or transfers;A little help with bathing/dressing/bathroom   Can travel by private vehicle        Equipment Recommendations None recommended by PT  Recommendations for Other Services       Functional Status Assessment Patient has had a recent decline in their functional status and demonstrates the ability to make significant improvements in function in a reasonable and predictable amount of time.     Precautions / Restrictions Precautions Precautions: Fall;Other (comment) Precaution/Restrictions Comments: radial cath 1/26 Required Braces or Orthoses: Other Brace Other Brace: post op shoe on lt foot Restrictions Other Position/Activity Restrictions: radial cath 1/26      Mobility  Bed Mobility Overal bed mobility: Needs Assistance Bed Mobility: Supine to Sit     Supine to sit: Min assist, HOB elevated     General bed mobility comments: Assist to elevate trunk into  sitting    Transfers Overall transfer level: Needs assistance Equipment used: Rollator (4 wheels) Transfers: Sit to/from Stand Sit to Stand: Min assist, Contact guard assist           General transfer comment: Assist to power up from low commode. Verbal cues for hand placement    Ambulation/Gait Ambulation/Gait assistance: Contact guard assist Gait Distance (Feet): 125 Feet Assistive device: Rollator (4 wheels) Gait Pattern/deviations: Step-through pattern, Decreased step length - right, Decreased step length - left, Decreased stride length, Trunk flexed Gait velocity: decr Gait velocity interpretation: <1.31 ft/sec, indicative of household ambulator   General Gait Details: Verbal cues to stand more erect and minimize weight bearing on RUE post radial cath  Stairs            Wheelchair Mobility     Tilt Bed    Modified Rankin (Stroke Patients Only)       Balance Overall balance assessment: Needs assistance Sitting-balance support: No upper extremity supported, Feet supported Sitting balance-Leahy Scale: Good     Standing balance support: Single extremity supported, Bilateral upper extremity supported, During functional activity Standing balance-Leahy Scale: Poor Standing balance comment: UE support                             Pertinent Vitals/Pain Pain Assessment Pain Assessment: No/denies pain    Home Living Family/patient expects to be discharged to:: Private residence Living Arrangements: Alone Available Help at Discharge: Neighbor;Friend(s);Available PRN/intermittently Type of Home: House (townhouse) Home Access: Stairs to enter   [employer] of Steps: 1   Home Layout: One level Home  Equipment: Rollator (4 wheels);Cane - single point      Prior Function Prior Level of Function : Independent/Modified Independent;Driving             Mobility Comments: Uses cane inside and rollator if out       Extremity/Trunk  Assessment   Upper Extremity Assessment Upper Extremity Assessment: Overall WFL for tasks assessed    Lower Extremity Assessment Lower Extremity Assessment: Generalized weakness       Communication   Communication Communication: No apparent difficulties    Cognition Arousal: Alert Behavior During Therapy: WFL for tasks assessed/performed   PT - Cognitive impairments: No apparent impairments                         Following commands: Intact       Cueing Cueing Techniques: Verbal cues, Tactile cues     General Comments      Exercises     Assessment/Plan    PT Assessment Patient needs continued PT services  PT Problem List Decreased strength;Decreased activity tolerance;Decreased balance;Decreased mobility       PT Treatment Interventions DME instruction;Gait training;Stair training;Functional mobility training;Therapeutic activities;Therapeutic exercise;Balance training;Patient/family education    PT Goals (Current goals can be found in the Care Plan section)  Acute Rehab PT Goals Patient Stated Goal: get stronger PT Goal Formulation: With patient Time For Goal Achievement: 05/21/24 Potential to Achieve Goals: Good    Frequency Min 2X/week     Co-evaluation               AM-PAC PT 6 Clicks Mobility  Outcome Measure Help needed turning from your back to your side while in a flat bed without using bedrails?: A Little Help needed moving from lying on your back to sitting on the side of a flat bed without using bedrails?: A Little Help needed moving to and from a bed to a chair (including a wheelchair)?: A Little Help needed standing up from a chair using your arms (e.g., wheelchair or bedside chair)?: A Little Help needed to walk in hospital room?: A Little Help needed climbing 3-5 steps with a railing? : A Little 6 Click Score: 18    End of Session Equipment Utilized During Treatment: Gait belt;Other (comment) (lt post op  shoe) Activity Tolerance: Patient limited by fatigue Patient left: in chair;with call bell/phone within reach Nurse Communication: Mobility status PT Visit Diagnosis: Unsteadiness on feet (R26.81);Other abnormalities of gait and mobility (R26.89);Muscle weakness (generalized) (M62.81)    Time: 8440-8370 PT Time Calculation (min) (ACUTE ONLY): 30 min   Charges:   PT Evaluation $PT Eval Moderate Complexity: 1 Mod PT Treatments $Gait Training: 8-22 mins PT General Charges $$ ACUTE PT VISIT: 1 Visit         [facility] PT Acute Rehabilitation Services Office [phone]   Rodgers ORN [facility] 05/07/2024, 5:46 PM

## 2024-05-07 NOTE — Progress Notes (Signed)
 " PROGRESS NOTE  Kaitlyn Lowe  FMW:[id number] DOB: June 07, 1950 DOA: 05/04/2024 PCP: Chrystal Lamarr RAMAN, MD   Brief Narrative: Patient is a 74 year old female with history of depression, anxiety, hypothyroidism osteoarthritis who initially presented with chest pain.  Pain was mainly in the central chest and described as squeezing in nature.  Elevated troponin as per lab, elevated BNP.  X-ray did not show any acute findings.  EKG showed sinus tachycardia with left anterior fascicular block, anterolateral Q waves.  Patient admitted for further management of NSTEMI, cardiology consulted.  Started on heparin  drip.  Echo showed diminished EF.  Underwent cardiac cath without any evidence of coronary artery disease.  Plan for discharge tomorrow to home.  Medications being optimized.  Assessment & Plan:  Principal Problem:   NSTEMI (non-ST elevated myocardial infarction) (HCC) Active Problems:   Acquired hypothyroidism   Bipolar II disorder (HCC)   Hypercalcemia   HFrEF (heart failure with reduced ejection fraction) (HCC)   NSTEMI: Presented with chest pain, elevated troponin.  Cardiology consulted and following.  Started on heparin  drip.  No history of prior cardiac disease.  Started  on aspirin , Lipitor, metoprolol . Underwent cardiac cath on 1/26 without finding of any coronary artery disease.  Acute HFrEF: Elevated pro BNP.Echo showed apical ballooning, EF of 25 to 30%, wall motion abnormalities, normal right ventricular function, normal pulmonary artery systolic pressure.  Possibility of Takotsubo syndrome .Appears euvolemic today. Started on aspirin , Jardiance , metoprolol , spironolactone .  Consideration of addition of Entresto  or low-dose ARB as outpatient.  Plan for repeat echo in 2 to 3 months.  Moderate MR: As seen on the echo.  Plan for at least annual echocardiogram  Hypothyroidism: Continue Synthyroid  Hyperlipidemia: LDL of 126.  On Lipitor 80 mg daily  Depression/anxiety: Continue  home medications  Leukocytosis: Likely reactive.  Resolved      DVT prophylaxis:heparin  injection 5,000 Units Start: 05/07/24 2200 SCD's Start: 05/07/24 0831IV heparin      Code Status: Full Code  Family Communication: None at the bedside  Patient status:Inpatient  Patient is from :Home  Anticipated discharge un:Ynfz  Estimated DC date: Tomorrow  Consultants: Cardiology  Procedures:Cardiac cath  Antimicrobials:  Anti-infectives (From admission, onward)    None       Subjective: Patient seen and examined at bedside today.  Hemodynamically stable.  Overall comfortable.  Lying in bed.  Just came from cath.  Denied any chest pain today.  Due to current weather conditions, she does not want to go home today.  She is anticipating tomorrow.  We discussed about PT evaluation.   Objective: Vitals:   05/07/24 0349 05/07/24 0752 05/07/24 0830 05/07/24 0928  BP: (!) 101/59  109/71 106/67  Pulse: (!) 53  78   Resp: 18  17   Temp: 98.4 F (36.9 C)  97.7 F (36.5 C)   TempSrc: Oral  Oral   SpO2: 96% 98% 99%   Weight:      Height:        Intake/Output Summary (Last 24 hours) at 05/07/2024 1027 Last data filed at 05/07/2024 9491 Gross per 24 hour  Intake 500 ml  Output 3050 ml  Net -2550 ml   Filed Weights   05/04/24 1414 05/04/24 2100  Weight: 76.7 kg 79.3 kg    Examination:  General exam: Overall comfortable, not in distress HEENT: PERRL Respiratory system:  no wheezes or crackles  Cardiovascular system: S1 & S2 heard, RRR.  Gastrointestinal system: Abdomen is nondistended, soft and nontender. Central nervous system: Alert  and oriented Extremities: No edema, no clubbing ,no cyanosis Skin: No rashes, no ulcers,no icterus      Data Reviewed: I have personally reviewed following labs and imaging studies  CBC: Recent Labs  Lab 05/04/24 1431 05/05/24 0435 05/06/24 0255 05/07/24 0250  WBC 11.9* 8.0 9.1 7.5  HGB 14.9 13.4 13.2 12.2  HCT 43.9 39.3 38.9  37.0  MCV 92.4 92.5 92.6 94.9  PLT 372 309 280 259   Basic Metabolic Panel: Recent Labs  Lab 05/04/24 1457 05/07/24 0250  NA 138 138  K 4.1 4.5  CL 101 104  CO2 23 24  GLUCOSE 154* 111*  BUN 18 24*  CREATININE 0.83 0.91  CALCIUM  10.7* 9.8     No results found for this or any previous visit (from the past 240 hours).   Radiology Studies: CARDIAC CATHETERIZATION Result Date: 05/07/2024 Coronary angiography 05/07/2024: LM: Normal LAD: No significant disease Lcx: No significant disease RCA: Dominant, No significant disease LVEDP 32 mmHg Conclusion: Normal coronary arteries with no coronary artery disease Stress induced cardiomyopathy Recommendation: GDMT for stress induced cardiomyopathy  ECHOCARDIOGRAM COMPLETE Result Date: 05/05/2024    ECHOCARDIOGRAM REPORT   Patient Name:   Kaitlyn Lowe Date of Exam: 05/05/2024 Medical Rec #:  [id number]    Height:       63.0 in Accession #:    [id number]   Weight:       174.8 lb Date of Birth:  03/07/1951     BSA:          1.826 m Patient Age:    73 years     BP:           104/72 mmHg Patient Gender: F            HR:           91 bpm. Exam Location:  Inpatient Procedure: 2D Echo, Cardiac Doppler and Color Doppler (Both Spectral and Color            Flow Doppler were utilized during procedure). Indications:    MI I21.9  History:        Patient has no prior history of Echocardiogram examinations.                 Signs/Symptoms:Hypertensive Heart Disease.  Sonographer:    Nathanel Devonshire Referring Phys: [id number] ARUN K THUKKANI IMPRESSIONS  1. There is apical ballooning and hyperdynamic basal left ventricular contractility. There is mild LV outflow tract obstruction due to compensatory hyperdynamic contractility of the basal LV wall segment. The peak outflow gradient is 38 mm Hg. Left ventricular ejection fraction, by estimation, is 25 to 30%. The left ventricle has severely decreased function. The left ventricle demonstrates regional wall motion abnormalities (see  scoring diagram/findings for description). Indeterminate diastolic filling due to E-A fusion.  2. Right ventricular systolic function is normal. The right ventricular size is normal. There is normal pulmonary artery systolic pressure. The estimated right ventricular systolic pressure is 24.0 mmHg.  3. The mitral valve is abnormal. Mild to moderate mitral valve regurgitation.  4. The aortic valve is tricuspid. There is mild calcification of the aortic valve. There is mild thickening of the aortic valve. Aortic valve regurgitation is trivial. Aortic valve sclerosis/calcification is present, without any evidence of aortic stenosis.  5. Aortic dilatation noted. There is mild dilatation of the aortic root, measuring 39 mm.  6. The inferior vena cava is normal in size with greater than 50% respiratory variability, suggesting right atrial pressure of  3 mmHg. Comparison(s): Findings are strongly suggestive with takotsubo syndrome, but cannot exclude multivessel CAD or stenosis in a wrap-around LAD artery. Conclusion(s)/Recommendation(s): Use caution in the use of inotropes as this may worsen LV outflow obstruction. FINDINGS  Left Ventricle: There is apical ballooning and hyperdynamic basal left ventricular contractility. There is mild LV outflow tract obstruction due to compensatory hyperdynamic contractility of the basal LV wall segment. The peak outflow gradient is 38 mm Hg. Left ventricular ejection fraction, by estimation, is 25 to 30%. The left ventricle has severely decreased function. The left ventricle demonstrates regional wall motion abnormalities. The left ventricular internal cavity size was normal in size. There is no left ventricular hypertrophy. Indeterminate diastolic filling due to E-A fusion.  LV Wall Scoring: The entire apex is dyskinetic. The mid anteroseptal segment, mid anterolateral segment, mid inferoseptal segment, and mid inferior segment are akinetic. The anterior wall, posterior wall, basal  anteroseptal segment, basal anterolateral segment, basal inferior segment, and basal inferoseptal segment are normal. Right Ventricle: The right ventricular size is normal. No increase in right ventricular wall thickness. Right ventricular systolic function is normal. There is normal pulmonary artery systolic pressure. The tricuspid regurgitant velocity is 2.29 m/s, and  with an assumed right atrial pressure of 3 mmHg, the estimated right ventricular systolic pressure is 24.0 mmHg. Left Atrium: Left atrial size was normal in size. Right Atrium: Right atrial size was normal in size. Pericardium: Trivial pericardial effusion is present. The pericardial effusion is anterior to the right ventricle. Mitral Valve: There is systolic anterior motion of the anterior mitral leaflet, with mild LVOT obstruction due to hypercontractility of the basal LV wall segment. The mitral valve is abnormal. Mild to moderate mitral valve regurgitation. Tricuspid Valve: The tricuspid valve is normal in structure. Tricuspid valve regurgitation is trivial. Aortic Valve: The aortic valve is tricuspid. There is mild calcification of the aortic valve. There is mild thickening of the aortic valve. Aortic valve regurgitation is trivial. Aortic valve sclerosis/calcification is present, without any evidence of aortic stenosis. Pulmonic Valve: The pulmonic valve was grossly normal. Pulmonic valve regurgitation is not visualized. No evidence of pulmonic stenosis. Aorta: Aortic dilatation noted. There is mild dilatation of the aortic root, measuring 39 mm. Venous: The inferior vena cava is normal in size with greater than 50% respiratory variability, suggesting right atrial pressure of 3 mmHg. IAS/Shunts: No atrial level shunt detected by color flow Doppler.  LEFT VENTRICLE PLAX 2D LVIDd:         5.60 cm LVIDs:         4.50 cm LV PW:         0.90 cm LV IVS:        0.80 cm LVOT diam:     2.00 cm LV SV:         181 LV SV Index:   99 LVOT Area:     3.14  cm  LV Volumes (MOD) LV vol d, MOD A2C: 74.0 ml LV vol d, MOD A4C: 75.1 ml LV vol s, MOD A2C: 54.0 ml LV vol s, MOD A4C: 56.8 ml LV SV MOD A2C:     20.0 ml LV SV MOD A4C:     75.1 ml LV SV MOD BP:      19.6 ml RIGHT VENTRICLE          IVC RV Basal diam:  2.90 cm  IVC diam: 1.60 cm TAPSE (M-mode): 1.7 cm LEFT ATRIUM  Index        RIGHT ATRIUM          Index LA diam:      2.60 cm 1.42 cm/m   RA Area:     6.86 cm LA Vol (A2C): 31.7 ml 17.36 ml/m  RA Volume:   12.60 ml 6.90 ml/m LA Vol (A4C): 18.2 ml 9.97 ml/m  AORTIC VALVE              PULMONIC VALVE LVOT Vmax:   283.50 cm/s  PV Vmax:       0.78 m/s LVOT Vmean:  187.000 cm/s PV Peak grad:  2.5 mmHg LVOT VTI:    0.578 m  AORTA Ao Root diam: 3.90 cm TRICUSPID VALVE TR Peak grad:   21.0 mmHg TR Vmax:        229.00 cm/s  SHUNTS Systemic VTI:  0.58 m Systemic Diam: 2.00 cm Mihai Croitoru MD Electronically signed by Jerel Balding MD Signature Date/Time: 05/05/2024/12:57:16 PM    Final     Scheduled Meds:  atorvastatin   80 mg Oral Daily   cariprazine   1.5 mg Oral Q72H   empagliflozin   10 mg Oral Daily   free water   250 mL Oral Once   heparin   5,000 Units Subcutaneous Q8H   levothyroxine   50 mcg Oral QAC breakfast   metoprolol  succinate  25 mg Oral Daily   polyethylene glycol  17 g Oral Daily   sacubitril -valsartan   1 tablet Oral BID   sodium chloride  flush  3 mL Intravenous Q12H   spironolactone   12.5 mg Oral Daily   Continuous Infusions:  sodium chloride        LOS: 3 days   Ivonne Mustache, MD [facility] P1/26/2026, 10:27 AM  "

## 2024-05-07 NOTE — Progress Notes (Addendum)
 "  Progress Note  Patient Name: Kaitlyn Lowe Date of Encounter: 05/07/2024 [facility] Cardiologist: None   Interval Summary   Just underwent underwent cardiac catheterization.  No chest pain or shortness of breath.  Radial site looks good   Vital Signs Vitals:   05/06/24 2001 05/07/24 0349 05/07/24 0752 05/07/24 0830  BP: (!) 93/55 (!) 101/59  109/71  Pulse: 85 (!) 53  78  Resp: 18 18  17   Temp: 98 F (36.7 C) 98.4 F (36.9 C)  97.7 F (36.5 C)  TempSrc: Oral Oral  Oral  SpO2: 96% 96% 98% 99%  Weight:      Height:        Intake/Output Summary (Last 24 hours) at 05/07/2024 0854 Last data filed at 05/07/2024 9491 Gross per 24 hour  Intake 500 ml  Output 3050 ml  Net -2550 ml      05/04/2024    9:00 PM 05/04/2024    2:14 PM 01/19/2024   12:57 PM  Last 3 Weights  Weight (lbs) 174 lb 13.2 oz 169 lb 1.5 oz 160 lb  Weight (kg) 79.3 kg 76.7 kg 72.576 kg      Telemetry/ECG  Sinus rhythm- Personally Reviewed  Physical Exam  GEN: No acute distress.   Neck: No JVD Cardiac: RRR, 3/6 murmur RSB Respiratory: Clear to auscultation bilaterally. GI: Soft, nontender, non-distended  MS: No edema  Patient Profile Patient with past medical history significant for hypothyroidism, hammertoe surgery, osteoarthritis, no prior cardiac history.  Patient admitted for NSTEMI with suspicions of Takotsubo cardiomyopathy.  Has new HFrEF.  Isolated episode of chest pain PTA. Of note she has had significant stress over the past 2 weeks with trying to buy a camper and finding out that there was a 50,000 RR price discrepancy between what she thought she owed and what they were trying to bill her.  She is been dealing with this for the past 2 weeks but had the invoice sent to her several days ago which may have been a precipitating factor if the sense of being stress-induced cardiomyopathy.  Assessment & Plan   NSTEMI Takotsubo cardiomyopathy with reduced EF -04/2024 echocardiogram  with apical ballooning and hyperdynamic LV.  Mild LVOT, peak gradient 38.  EF 25 to 30%, normal RV.  Mild to moderate MR.  Aortic sclerosis present.  Aortic dilatation 39 mm.  Presenting with isolated episode of chest pain with stress-induced trigger.  Troponins up to 515.  Echocardiogram with obstructive CAD versus Takotsubo cardiomyopathy.  Underwent cardiac catheterization today that demonstrates no evidence of CAD.  Euvolemic on exam.  No complaints of shortness of breath. GDMT: Continue with Jardiance  10 mg, Toprol -XL 50 mg, spironolactone  25 mg.  Ran cost analysis and GDMT likely affordable. Further titration of GDMT limited by soft blood pressure.  Log blood pressure and daily weights until seen at follow-up.  Consider adding Entresto  or low dose ARB at that time. Stop aspirin  and heparin , no indications for this.  Okay to continue atorvastatin  80 mg daily.  LDL 126. Repeat echocardiogram 2 to 3 months.  Also will need echocardiograms at least annually for moderate MR. Recommend repeat BMP 1 week.  I think likely could discharge today although she lives independently at home with no children and not married.  She would prefer to discharge tomorrow with current weather conditions.  Otherwise prior to admission very active doing pickleball and sports.  Will arrange follow-up.   For questions or updates, please contact [facility]  Please consult [email or web address] for contact info under       Signed, Thom LITTIE Sluder, PA-C    "

## 2024-05-08 ENCOUNTER — Other Ambulatory Visit (HOSPITAL_COMMUNITY): Payer: Self-pay

## 2024-05-08 DIAGNOSIS — I5181 Takotsubo syndrome: Secondary | ICD-10-CM | POA: Diagnosis not present

## 2024-05-08 DIAGNOSIS — I214 Non-ST elevation (NSTEMI) myocardial infarction: Secondary | ICD-10-CM | POA: Diagnosis not present

## 2024-05-08 LAB — CBC
HCT: 36.8 % (ref 36.0–46.0)
Hemoglobin: 12.4 g/dL (ref 12.0–15.0)
MCH: 31.5 pg (ref 26.0–34.0)
MCHC: 33.7 g/dL (ref 30.0–36.0)
MCV: 93.4 fL (ref 80.0–100.0)
Platelets: 261 10*3/uL (ref 150–400)
RBC: 3.94 MIL/uL (ref 3.87–5.11)
RDW: 13.6 % (ref 11.5–15.5)
WBC: 8 10*3/uL (ref 4.0–10.5)
nRBC: 0 % (ref 0.0–0.2)

## 2024-05-08 LAB — BASIC METABOLIC PANEL WITH GFR
Anion gap: 10 (ref 5–15)
BUN: 28 mg/dL — ABNORMAL HIGH (ref 8–23)
CO2: 24 mmol/L (ref 22–32)
Calcium: 10 mg/dL (ref 8.9–10.3)
Chloride: 104 mmol/L (ref 98–111)
Creatinine, Ser: 0.97 mg/dL (ref 0.44–1.00)
GFR, Estimated: >60 mL/min
Glucose, Bld: 112 mg/dL — ABNORMAL HIGH (ref 70–99)
Potassium: 4.5 mmol/L (ref 3.5–5.1)
Sodium: 137 mmol/L (ref 135–145)

## 2024-05-08 MED ORDER — SPIRONOLACTONE 25 MG PO TABS
12.5000 mg | ORAL_TABLET | Freq: Every day | ORAL | 0 refills | Status: AC
  Filled 2024-05-08: qty 30, 60d supply, fill #0

## 2024-05-08 MED ORDER — ATORVASTATIN CALCIUM 80 MG PO TABS
80.0000 mg | ORAL_TABLET | Freq: Every day | ORAL | 0 refills | Status: AC
  Filled 2024-05-08: qty 30, 30d supply, fill #0

## 2024-05-08 MED ORDER — EMPAGLIFLOZIN 10 MG PO TABS
10.0000 mg | ORAL_TABLET | Freq: Every day | ORAL | 0 refills | Status: AC
  Filled 2024-05-08: qty 30, 30d supply, fill #0

## 2024-05-08 MED ORDER — METOPROLOL SUCCINATE ER 25 MG PO TB24
25.0000 mg | ORAL_TABLET | Freq: Every day | ORAL | 0 refills | Status: AC
  Filled 2024-05-08: qty 30, 30d supply, fill #0

## 2024-05-08 MED ORDER — SACUBITRIL-VALSARTAN 24-26 MG PO TABS
1.0000 | ORAL_TABLET | Freq: Two times a day (BID) | ORAL | 0 refills | Status: AC
  Filled 2024-05-08: qty 60, 30d supply, fill #0

## 2024-05-08 NOTE — TOC Transition Note (Signed)
 Transition of Care [facility]) - Discharge Note   Patient Details  Name: Kaitlyn Lowe MRN: [id number] Date of Birth: 1951-01-12  Transition of Care [facility]) CM/SW Contact:  Sudie Erminio Deems, RN Phone Number: 05/08/2024, 11:00 AM   Clinical Narrative:  Patient will discharge home today. ICM did speak with patient regarding home health needs. Patient is agreeable to home health services. ICM reviewed the Medicare.gov list with the patient and she chose [facility]. Referral submitted via the hub and start of care to begin within 24-48 hours post dc home. No further needs identified at this time.     Final next level of care: Home w Home Health Services Barriers to Discharge: No Barriers Identified   Patient Goals and CMS Choice Patient states their goals for this hospitalization and ongoing recovery are:: Plan to returrn home with hh services.   Choice offered to / list presented to : Patient (Pastient asked for [facility]- reviewed the Medicare.gov list with the patient.)      Discharge Placement                       Discharge Plan and Services Additional resources added to the After Visit Summary for     Discharge Planning Services: CM Consult Post Acute Care Choice: Home Health            DME Agency: NA       HH Arranged: PT HH Agency: [facility] Date HH Agency Contacted: 05/08/24 Time HH Agency Contacted: 1059 Representative spoke with at [facility] Agency: hub  Social Drivers of Health (SDOH) Interventions SDOH Screenings   Food Insecurity: No Food Insecurity (05/04/2024)  Housing: Low Risk (05/04/2024)  Transportation Needs: No Transportation Needs (05/04/2024)  Utilities: Not At Risk (05/04/2024)  Social Connections: Moderately Integrated (05/04/2024)  Tobacco Use: Low Risk (05/04/2024)     Readmission Risk Interventions     No data to display

## 2024-05-08 NOTE — Progress Notes (Signed)
 Physical Therapy Treatment Patient Details Name: Kaitlyn Lowe MRN: [id number] DOB: 11-Jan-1951 Today's Date: 05/08/2024   History of Present Illness Pt is 74 year old presented to [facility] on  05/04/24 for chest pain. Pt with NSTEMI and acute HFrEF. PMH - lt hammer toe surgery 03/2024, rt thumb surgery, anxiety, depression, osteoarthritis    PT Comments  Pt required min assist bed mobility, min assist transfers, and CGA amb 60' x 2 with rollator. Seated rest break between gait trials. HR into 120s. Fatigued quickly. Plan is for home alone with limited assist. Recommending HHPT. Pt in recliner with feet elevated at end of session.     If plan is discharge home, recommend the following: Help with stairs or ramp for entrance;Assist for transportation;Assistance with cooking/housework;A little help with walking and/or transfers;A little help with bathing/dressing/bathroom   Can travel by private vehicle        Equipment Recommendations  None recommended by PT    Recommendations for Other Services       Precautions / Restrictions Precautions Precautions: Fall;Other (comment) Precaution/Restrictions Comments: radial cath 1/26 Required Braces or Orthoses: Other Brace Other Brace: post op shoe on lt foot     Mobility  Bed Mobility Overal bed mobility: Needs Assistance Bed Mobility: Supine to Sit     Supine to sit: Min assist, HOB elevated, Used rails     General bed mobility comments: increased time, assist to elevate trunk    Transfers Overall transfer level: Needs assistance Equipment used: Rollator (4 wheels) Transfers: Sit to/from Stand Sit to Stand: Min assist           General transfer comment: assist to power up, increased time    Ambulation/Gait Ambulation/Gait assistance: Contact guard assist Gait Distance (Feet): 60 Feet (x 2) Assistive device: Rollator (4 wheels) Gait Pattern/deviations: Step-through pattern, Decreased stride length, Trunk flexed Gait velocity:  decreased Gait velocity interpretation: <1.31 ft/sec, indicative of household ambulator   General Gait Details: cues for posture and proximity to rollator. Seated rest break between gait trials. HR into 120s. Fatigued quickly. Cues for pursed lip breathing.   Stairs             Wheelchair Mobility     Tilt Bed    Modified Rankin (Stroke Patients Only)       Balance Overall balance assessment: Needs assistance Sitting-balance support: No upper extremity supported, Feet supported Sitting balance-Leahy Scale: Good     Standing balance support: Bilateral upper extremity supported, During functional activity, Reliant on assistive device for balance Standing balance-Leahy Scale: Poor                              Communication Communication Communication: No apparent difficulties  Cognition Arousal: Alert Behavior During Therapy: WFL for tasks assessed/performed   PT - Cognitive impairments: No apparent impairments                         Following commands: Intact      Cueing Cueing Techniques: Verbal cues, Tactile cues  Exercises      General Comments        Pertinent Vitals/Pain Pain Assessment Pain Assessment: No/denies pain    Home Living                          Prior Function            PT  Goals (current goals can now be found in the care plan section) Acute Rehab PT Goals Patient Stated Goal: get stronger Progress towards PT goals: Progressing toward goals    Frequency    Min 2X/week      PT Plan      Co-evaluation              AM-PAC PT 6 Clicks Mobility   Outcome Measure  Help needed turning from your back to your side while in a flat bed without using bedrails?: A Little Help needed moving from lying on your back to sitting on the side of a flat bed without using bedrails?: A Little Help needed moving to and from a bed to a chair (including a wheelchair)?: A Little Help needed standing  up from a chair using your arms (e.g., wheelchair or bedside chair)?: A Little Help needed to walk in hospital room?: A Little Help needed climbing 3-5 steps with a railing? : A Lot 6 Click Score: 17    End of Session Equipment Utilized During Treatment: Gait belt;Other (comment) (L post op shoe) Activity Tolerance: Patient limited by fatigue Patient left: in chair;with call bell/phone within reach Nurse Communication: Mobility status PT Visit Diagnosis: Unsteadiness on feet (R26.81);Other abnormalities of gait and mobility (R26.89);Muscle weakness (generalized) (M62.81)     Time: 0802-0829 PT Time Calculation (min) (ACUTE ONLY): 27 min  Charges:    $Gait Training: 23-37 mins PT General Charges $$ ACUTE PT VISIT: 1 Visit                     Sari MATSU., PT  Office # [phone]    Erven Sari Shaker 05/08/2024, 9:43 AM

## 2024-05-08 NOTE — Discharge Summary (Signed)
 Physician Discharge Summary  Kaitlyn Lowe FMW:[id number] DOB: Nov 21, 1950 DOA: 05/04/2024  PCP: Chrystal Lamarr RAMAN, MD  Admit date: 05/04/2024 Discharge date: 05/08/2024  Admitted From: Home Disposition:  Home  Discharge Condition:Stable CODE STATUS:FULL Diet recommendation: Carb Modified   Brief/Interim Summary: Patient is a 74 year old female with history of depression, anxiety, hypothyroidism osteoarthritis who initially presented with chest pain.  Pain was mainly in the central chest and described as squeezing in nature.  Elevated troponin as per lab, elevated BNP.  X-ray did not show any acute findings.  EKG showed sinus tachycardia with left anterior fascicular block, anterolateral Q waves.  Patient admitted for further management of NSTEMI, cardiology consulted.  Started on heparin  drip.  Echo showed diminished EF.  Underwent cardiac cath without any evidence of coronary artery disease.  Medications being optimized.  Cardiology cleared for discharge.  Medically stable for discharge today.  Following problems were addressed during the hospitalization:  NSTEMI: Presented with chest pain, elevated troponin.  Cardiology consulted and following.  Started on heparin  drip.  No history of prior cardiac disease.  Started  on aspirin , Lipitor, metoprolol . Underwent cardiac cath on 1/26 without finding of any coronary artery disease.   Acute HFrEF: Elevated pro BNP.Echo showed apical ballooning, EF of 25 to 30%, wall motion abnormalities, normal right ventricular function, normal pulmonary artery systolic pressure.  Possibility of Takotsubo syndrome .Appears euvolemic today. Started on aspirin , Jardiance , metoprolol , spironolactone , Entresto . Plan for repeat echo in 2 to 3 months.  She will follow-up with cardiology as an outpatient   Moderate MR: As seen on the echo.  Plan for at least annual echocardiogram   Hypothyroidism: Continue Synthyroid   Hyperlipidemia: LDL of 126.  On Lipitor 80 mg  daily   Depression/anxiety: Continue home medications   Leukocytosis: Likely reactive.  Resolved   Discharge Diagnoses:  Principal Problem:   NSTEMI (non-ST elevated myocardial infarction) (HCC) Active Problems:   Acquired hypothyroidism   Bipolar II disorder (HCC)   Hypercalcemia   HFrEF (heart failure with reduced ejection fraction) [facility])    Discharge Instructions  Discharge Instructions     AMB referral to Phase II Cardiac Rehabilitation   Complete by: As directed    Diagnosis: NSTEMI   After initial evaluation and assessments completed: Virtual Based Care may be provided alone or in conjunction with Phase 2 Cardiac Rehab based on patient barriers.: Yes   Intensive Cardiac Rehabilitation (ICR) [facility] location only OR Traditional Cardiac Rehabilitation (TCR) *If criteria for ICR are not met will enroll in TCR [facility] only): Yes   Diet general   Complete by: As directed    Discharge instructions   Complete by: As directed    1)Please take your medications as instructed 2)Follow up with your PCP in a week 3)You will be called by cardiology for follow-up appointment 4)Monitor your blood pressure at home   Increase activity slowly   Complete by: As directed       Allergies as of 05/08/2024       Reactions   Codeine Nausea Only        Medication List     STOP taking these medications    aspirin  81 MG chewable tablet   propranolol  10 MG tablet Commonly known as: INDERAL        TAKE these medications    alendronate 70 MG tablet Commonly known as: FOSAMAX Take 70 mg by mouth once a week. On Wednesday or Thursday   amitriptyline 10 MG tablet Commonly known as: ELAVIL Take  10 mg by mouth at bedtime.   atorvastatin  80 MG tablet Commonly known as: LIPITOR Take 1 tablet (80 mg total) by mouth daily. Start taking on: May 09, 2024   azelastine 0.1 % nasal spray Commonly known as: ASTELIN Place 1 spray into both nostrils daily as needed for rhinitis or  allergies. Use in each nostril as directed   B Complex Caps Take 1 capsule by mouth daily.   cariprazine  1.5 MG capsule Commonly known as: Vraylar  Take 1 capsule (1.5 mg total) by mouth daily. What changed:  when to take this additional instructions   cholecalciferol 25 MCG (1000 UNIT) tablet Commonly known as: VITAMIN D3 Take 1,000 Units by mouth daily.   diazepam  10 MG tablet Commonly known as: VALIUM  Take 2 tablets (20 mg total) by mouth at bedtime.   diphenhydrAMINE 25 MG tablet Commonly known as: BENADRYL Take 25 mg by mouth every 6 (six) hours as needed for itching.   docusate sodium 100 MG capsule Commonly known as: COLACE Take 200 mg by mouth in the morning.   empagliflozin  10 MG Tabs tablet Commonly known as: JARDIANCE  Take 1 tablet (10 mg total) by mouth daily. Start taking on: May 09, 2024   levothyroxine  50 MCG tablet Commonly known as: SYNTHROID  TAKE 1 TABLET EVERY MORNING ON AN EMPTY STOMACH   metoprolol  succinate 25 MG 24 hr tablet Commonly known as: TOPROL -XL Take 1 tablet (25 mg total) by mouth daily. Start taking on: May 09, 2024   oxyCODONE  5 MG immediate release tablet Commonly known as: Roxicodone  Take 1 tablet (5 mg total) by mouth every 6 (six) hours as needed.   sacubitril -valsartan  24-26 MG Commonly known as: ENTRESTO  Take 1 tablet by mouth 2 (two) times daily.   spironolactone  25 MG tablet Commonly known as: ALDACTONE  Take 0.5 tablets (12.5 mg total) by mouth daily. Start taking on: May 09, 2024   SYSTANE OP Place 1 drop into both eyes in the morning.   UNABLE TO FIND Take 1 Application by mouth as needed. Med Name: neuropathy cream   Vitamin C 500 MG Chew Chew 1 tablet by mouth daily.   Voltaren Arthritis Pain 1 % Gel Generic drug: diclofenac Sodium Apply 1 Application topically as needed.        Follow-up Information     Chrystal Lamarr RAMAN, MD. Schedule an appointment as soon as possible for a visit in  1 week(s).   Specialty: Family Medicine Contact information: [address] [place] [address] [phone]                Allergies[1]  Consultations: Cardiology   Procedures/Studies: CARDIAC CATHETERIZATION Result Date: 05/07/2024 Coronary angiography 05/07/2024: LM: Normal LAD: No significant disease Lcx: No significant disease RCA: Dominant, No significant disease LVEDP 32 mmHg Conclusion: Normal coronary arteries with no coronary artery disease Stress induced cardiomyopathy Recommendation: GDMT for stress induced cardiomyopathy  ECHOCARDIOGRAM COMPLETE Result Date: 05/05/2024    ECHOCARDIOGRAM REPORT   Patient Name:   JONETTE WASSEL Date of Exam: 05/05/2024 Medical Rec #:  [id number]    Height:       63.0 in Accession #:    [id number]   Weight:       174.8 lb Date of Birth:  01/05/1951     BSA:          1.826 m Patient Age:    73 years     BP:           104/72 mmHg Patient Gender:  F            HR:           91 bpm. Exam Location:  Inpatient Procedure: 2D Echo, Cardiac Doppler and Color Doppler (Both Spectral and Color            Flow Doppler were utilized during procedure). Indications:    MI I21.9  History:        Patient has no prior history of Echocardiogram examinations.                 Signs/Symptoms:Hypertensive Heart Disease.  Sonographer:    Nathanel Devonshire Referring Phys: [id number] ARUN K THUKKANI IMPRESSIONS  1. There is apical ballooning and hyperdynamic basal left ventricular contractility. There is mild LV outflow tract obstruction due to compensatory hyperdynamic contractility of the basal LV wall segment. The peak outflow gradient is 38 mm Hg. Left ventricular ejection fraction, by estimation, is 25 to 30%. The left ventricle has severely decreased function. The left ventricle demonstrates regional wall motion abnormalities (see scoring diagram/findings for description). Indeterminate diastolic filling due to E-A fusion.  2. Right ventricular systolic function is normal.  The right ventricular size is normal. There is normal pulmonary artery systolic pressure. The estimated right ventricular systolic pressure is 24.0 mmHg.  3. The mitral valve is abnormal. Mild to moderate mitral valve regurgitation.  4. The aortic valve is tricuspid. There is mild calcification of the aortic valve. There is mild thickening of the aortic valve. Aortic valve regurgitation is trivial. Aortic valve sclerosis/calcification is present, without any evidence of aortic stenosis.  5. Aortic dilatation noted. There is mild dilatation of the aortic root, measuring 39 mm.  6. The inferior vena cava is normal in size with greater than 50% respiratory variability, suggesting right atrial pressure of 3 mmHg. Comparison(s): Findings are strongly suggestive with takotsubo syndrome, but cannot exclude multivessel CAD or stenosis in a wrap-around LAD artery. Conclusion(s)/Recommendation(s): Use caution in the use of inotropes as this may worsen LV outflow obstruction. FINDINGS  Left Ventricle: There is apical ballooning and hyperdynamic basal left ventricular contractility. There is mild LV outflow tract obstruction due to compensatory hyperdynamic contractility of the basal LV wall segment. The peak outflow gradient is 38 mm Hg. Left ventricular ejection fraction, by estimation, is 25 to 30%. The left ventricle has severely decreased function. The left ventricle demonstrates regional wall motion abnormalities. The left ventricular internal cavity size was normal in size. There is no left ventricular hypertrophy. Indeterminate diastolic filling due to E-A fusion.  LV Wall Scoring: The entire apex is dyskinetic. The mid anteroseptal segment, mid anterolateral segment, mid inferoseptal segment, and mid inferior segment are akinetic. The anterior wall, posterior wall, basal anteroseptal segment, basal anterolateral segment, basal inferior segment, and basal inferoseptal segment are normal. Right Ventricle: The right  ventricular size is normal. No increase in right ventricular wall thickness. Right ventricular systolic function is normal. There is normal pulmonary artery systolic pressure. The tricuspid regurgitant velocity is 2.29 m/s, and  with an assumed right atrial pressure of 3 mmHg, the estimated right ventricular systolic pressure is 24.0 mmHg. Left Atrium: Left atrial size was normal in size. Right Atrium: Right atrial size was normal in size. Pericardium: Trivial pericardial effusion is present. The pericardial effusion is anterior to the right ventricle. Mitral Valve: There is systolic anterior motion of the anterior mitral leaflet, with mild LVOT obstruction due to hypercontractility of the basal LV wall segment. The mitral valve is abnormal. Mild to  moderate mitral valve regurgitation. Tricuspid Valve: The tricuspid valve is normal in structure. Tricuspid valve regurgitation is trivial. Aortic Valve: The aortic valve is tricuspid. There is mild calcification of the aortic valve. There is mild thickening of the aortic valve. Aortic valve regurgitation is trivial. Aortic valve sclerosis/calcification is present, without any evidence of aortic stenosis. Pulmonic Valve: The pulmonic valve was grossly normal. Pulmonic valve regurgitation is not visualized. No evidence of pulmonic stenosis. Aorta: Aortic dilatation noted. There is mild dilatation of the aortic root, measuring 39 mm. Venous: The inferior vena cava is normal in size with greater than 50% respiratory variability, suggesting right atrial pressure of 3 mmHg. IAS/Shunts: No atrial level shunt detected by color flow Doppler.  LEFT VENTRICLE PLAX 2D LVIDd:         5.60 cm LVIDs:         4.50 cm LV PW:         0.90 cm LV IVS:        0.80 cm LVOT diam:     2.00 cm LV SV:         181 LV SV Index:   99 LVOT Area:     3.14 cm  LV Volumes (MOD) LV vol d, MOD A2C: 74.0 ml LV vol d, MOD A4C: 75.1 ml LV vol s, MOD A2C: 54.0 ml LV vol s, MOD A4C: 56.8 ml LV SV MOD A2C:      20.0 ml LV SV MOD A4C:     75.1 ml LV SV MOD BP:      19.6 ml RIGHT VENTRICLE          IVC RV Basal diam:  2.90 cm  IVC diam: 1.60 cm TAPSE (M-mode): 1.7 cm LEFT ATRIUM           Index        RIGHT ATRIUM          Index LA diam:      2.60 cm 1.42 cm/m   RA Area:     6.86 cm LA Vol (A2C): 31.7 ml 17.36 ml/m  RA Volume:   12.60 ml 6.90 ml/m LA Vol (A4C): 18.2 ml 9.97 ml/m  AORTIC VALVE              PULMONIC VALVE LVOT Vmax:   283.50 cm/s  PV Vmax:       0.78 m/s LVOT Vmean:  187.000 cm/s PV Peak grad:  2.5 mmHg LVOT VTI:    0.578 m  AORTA Ao Root diam: 3.90 cm TRICUSPID VALVE TR Peak grad:   21.0 mmHg TR Vmax:        229.00 cm/s  SHUNTS Systemic VTI:  0.58 m Systemic Diam: 2.00 cm Jerel Croitoru MD Electronically signed by Jerel Balding MD Signature Date/Time: 05/05/2024/12:57:16 PM    Final    DG Chest 2 View Result Date: 05/04/2024 EXAM: 2 VIEW(S) XRAY OF THE CHEST 05/04/2024 03:00:00 PM COMPARISON: 01/13/2024 CLINICAL HISTORY: Chest pain. FINDINGS: LUNGS AND PLEURA: Linear left base subsegmental atelectasis or scarring, unchanged. No pleural effusion. No pneumothorax. HEART AND MEDIASTINUM: Aortic atherosclerosis. No acute abnormality of the cardiac silhouette. BONES AND SOFT TISSUES: No acute osseous abnormality. IMPRESSION: 1. No acute findings. Electronically signed by: Dayne Hassell MD 05/04/2024 03:27 PM EST RP Workstation: [id number]   XR Finger Thumb Right Result Date: 04/10/2024 Right thumb x-rays demonstrate interval healing of the thumb metacarpal fracture, there does remain some evidence of the prior fracture pattern with ongoing callus formation and bony consolidation.  No interval displacement or dislocation.     Subjective: Patient seen and examined at bedside today.  Hemodynamically stable.  Comfortable today.  Sitting on the bed.  No chest pain.  Feels ready to go home today.  Medically stable for discharge  Discharge Exam: Vitals:   05/08/24 0452 05/08/24 0904  BP: 97/64  122/67  Pulse:  94  Resp:  20  Temp:  98.8 F (37.1 C)  SpO2:  97%   Vitals:   05/08/24 0002 05/08/24 0354 05/08/24 0452 05/08/24 0904  BP: (!) 91/59 (!) 86/60 97/64 122/67  Pulse: 91   94  Resp: 18 18  20   Temp: 99.2 F (37.3 C) 98.4 F (36.9 C)  98.8 F (37.1 C)  TempSrc: Oral Oral  Oral  SpO2: 97% 98%  97%  Weight:      Height:        General: Pt is alert, awake, not in acute distress Cardiovascular: RRR, S1/S2 +, no rubs, no gallops Respiratory: CTA bilaterally, no wheezing, no rhonchi Abdominal: Soft, NT, ND, bowel sounds + Extremities: no edema, no cyanosis    The results of significant diagnostics from this hospitalization (including imaging, microbiology, ancillary and laboratory) are listed below for reference.     Microbiology: No results found for this or any previous visit (from the past 240 hours).   Labs: BNP (last 3 results) No results for input(s): BNP in the last 8760 hours. Basic Metabolic Panel: Recent Labs  Lab 05/04/24 1457 05/07/24 0250 05/08/24 0436  NA 138 138 137  K 4.1 4.5 4.5  CL 101 104 104  CO2 23 24 24   GLUCOSE 154* 111* 112*  BUN 18 24* 28*  CREATININE 0.83 0.91 0.97  CALCIUM  10.7* 9.8 10.0   Liver Function Tests: Recent Labs  Lab 05/04/24 1457  AST 44*  ALT 25  ALKPHOS 51  BILITOT 0.5  PROT 7.3  ALBUMIN 4.5   No results for input(s): LIPASE, AMYLASE in the last 168 hours. No results for input(s): AMMONIA in the last 168 hours. CBC: Recent Labs  Lab 05/04/24 1431 05/05/24 0435 05/06/24 0255 05/07/24 0250 05/08/24 0436  WBC 11.9* 8.0 9.1 7.5 8.0  HGB 14.9 13.4 13.2 12.2 12.4  HCT 43.9 39.3 38.9 37.0 36.8  MCV 92.4 92.5 92.6 94.9 93.4  PLT 372 309 280 259 261   Cardiac Enzymes: No results for input(s): CKTOTAL, CKMB, CKMBINDEX, TROPONINI in the last 168 hours. BNP: Invalid input(s): POCBNP CBG: No results for input(s): GLUCAP in the last 168 hours. D-Dimer No results for input(s):  DDIMER in the last 72 hours. Hgb A1c No results for input(s): HGBA1C in the last 72 hours. Lipid Profile No results for input(s): CHOL, HDL, LDLCALC, TRIG, CHOLHDL, LDLDIRECT in the last 72 hours. Thyroid  function studies No results for input(s): TSH, T4TOTAL, T3FREE, THYROIDAB in the last 72 hours.  Invalid input(s): FREET3 Anemia work up No results for input(s): VITAMINB12, FOLATE, FERRITIN, TIBC, IRON, RETICCTPCT in the last 72 hours. Urinalysis    Component Value Date/Time   COLORURINE YELLOW 03/19/2022 0849   APPEARANCEUR HAZY (A) 03/19/2022 0849   LABSPEC 1.019 03/19/2022 0849   PHURINE 7.0 03/19/2022 0849   GLUCOSEU NEGATIVE 03/19/2022 0849   HGBUR NEGATIVE 03/19/2022 0849   BILIRUBINUR NEGATIVE 03/19/2022 0849   KETONESUR NEGATIVE 03/19/2022 0849   PROTEINUR TRACE (A) 03/19/2022 0849   UROBILINOGEN 1.0 10/17/2012 1527   NITRITE NEGATIVE 03/19/2022 0849   LEUKOCYTESUR SMALL (A) 03/19/2022 0849   Sepsis Labs Recent  Labs  Lab 05/05/24 0435 05/06/24 0255 05/07/24 0250 05/08/24 0436  WBC 8.0 9.1 7.5 8.0   Microbiology No results found for this or any previous visit (from the past 240 hours).  Please note: You were cared for by a hospitalist during your hospital stay. Once you are discharged, your primary care physician will handle any further medical issues. Please note that NO REFILLS for any discharge medications will be authorized once you are discharged, as it is imperative that you return to your primary care physician (or establish a relationship with a primary care physician if you do not have one) for your post hospital discharge needs so that they can reassess your need for medications and monitor your lab values.    Time coordinating discharge: 40 minutes  SIGNED:   Ivonne Mustache, MD  [facility] 05/08/2024, 10:28 AM Pager [phone]  If 7PM-7AM, please contact night-coverage [email or web address] Password  [email or web address]    [1]  Allergies Allergen Reactions   Codeine Nausea Only

## 2024-05-08 NOTE — Progress Notes (Signed)
 CARDIAC REHAB PHASE I   Post MI education including restrictions, risk factors, exercise guidelines, aspirin  importance, MI booklet, heart healthy diet, and CRP2 reviewed. All questions and concerns addressed. Will refer to [facility] for CRP2.    [id number] Kaitlyn JAYSON [place], RN BSN 05/08/2024 10:56 AM

## 2024-05-08 NOTE — Progress Notes (Signed)
" ° °  Heart Failure Stewardship Pharmacist Progress Note   PCP: Chrystal Lamarr RAMAN, MD PCP-Cardiologist: None    HPI:  74 yo F with PMH of depression and anxiety.   Presented to the ED on 1/23 with chest pain and nausea. EKG demonstrates sinus tachycardia with left anterior fascicular block anterolateral Q waves no ischemic changes. Troponin was 515 with a second being 513. proBNP was 4966. CXR demonstrated no acute cardiopulmonary findings. Has undergone increased stress over the last two weeks.  ECHO 1/24 with LVEF 25-30%, apical ballooning, mild LVOT, RWMA, RV normal, mild to moderate MR. Taken [facility] on 1/26 and found to have normal coronary arteries with no CAD, stress induced cardiomyopathy. LVEDP 32.  Current HF Medications: Beta Blocker: metoprolol  XL 25 mg daily ACE/ARB/ARNI: Entresto  24/26 mg BID MRA: spironolactone  12.5 mg daily SGLT2i: Jardiance  10 mg daily  Prior to admission HF Medications: None  Pertinent Lab Values: Serum creatinine 0.97, BUN 28, Potassium 4.5, Sodium 137, proBNP 4966   Vital Signs: Weight: 174 lbs (admission weight: 174 lbs) Blood pressure: 90/70s  Heart rate: 80s  I/O: net +0.3L yesterday; net -2.6L since admission  Medication Assistance / Insurance Benefits Check: Does the patient have prescription insurance?  Yes Type of insurance plan: [employer]  Outpatient Pharmacy:  Prior to admission outpatient pharmacy[facility] Is the patient willing to use [facility] TOC pharmacy at discharge? Yes Is the patient willing to transition their outpatient pharmacy to utilize a [facility] outpatient pharmacy?   Pending    Assessment: 1. Acute systolic CHF (LVEF 25-30%), due to Takotsubo cardiomyopathy. NYHA class II symptoms. - Volume status ok - Continue metoprolol  XL 25 mg daily - Metoprolol  and spironolactone  doses decreased to add Entresto  24/26 mg BID - Continue spironolactone  12.5 mg daily  - Continue Jardiance  10 mg daily   Plan: 1)  Medication changes recommended at this time: - No changes  2) Patient assistance: - Farxiga/Jardiance  copay $40 Entresto  copay $10  3)  Education  - Patient has been educated on current HF medications and potential additions to HF medication regimen - Patient verbalizes understanding that over the next few months, these medication doses may change and more medications may be added to optimize HF regimen - Patient has been educated on basic disease state pathophysiology and goals of therapy   Duwaine Plant, PharmD, BCPS Heart Failure Stewardship Pharmacist Phone [phone]   "

## 2024-05-08 NOTE — Progress Notes (Signed)
"  °  Progress Note  Patient Name: Kaitlyn Lowe Date of Encounter: 05/08/2024 [facility] Cardiologist: None   Interval Summary   No acute complaints.  Ambulated this morning.  Sleepy on exam.  Vital Signs Vitals:   05/08/24 0002 05/08/24 0354 05/08/24 0452 05/08/24 0904  BP: (!) 91/59 (!) 86/60 97/64 122/67  Pulse: 91   94  Resp: 18 18  20   Temp: 99.2 F (37.3 C) 98.4 F (36.9 C)  98.8 F (37.1 C)  TempSrc: Oral Oral  Oral  SpO2: 97% 98%  97%  Weight:      Height:        Intake/Output Summary (Last 24 hours) at 05/08/2024 0955 Last data filed at 05/08/2024 0037 Gross per 24 hour  Intake 250 ml  Output 350 ml  Net -100 ml      05/04/2024    9:00 PM 05/04/2024    2:14 PM 01/19/2024   12:57 PM  Last 3 Weights  Weight (lbs) 174 lb 13.2 oz 169 lb 1.5 oz 160 lb  Weight (kg) 79.3 kg 76.7 kg 72.576 kg      Telemetry/ECG  Sinus rhythm- Personally Reviewed  Physical Exam  GEN: No acute distress.   Neck: No JVD Cardiac: RRR, 3/6 murmur RSB Respiratory: Clear to auscultation bilaterally. GI: Soft, nontender, non-distended  MS: No edema  Patient Profile Patient with past medical history significant for hypothyroidism, hammertoe surgery, osteoarthritis, no prior cardiac history.  Patient admitted for NSTEMI with suspicions of Takotsubo cardiomyopathy.  Has new HFrEF.  Isolated episode of chest pain PTA. Of note she has had significant stress over the past 2 weeks with trying to buy a camper and finding out that there was a 50,000 RR price discrepancy between what she thought she owed and what they were trying to bill her.  She is been dealing with this for the past 2 weeks but had the invoice sent to her several days ago which may have been a precipitating factor if the sense of being stress-induced cardiomyopathy.  Assessment & Plan   NSTEMI Takotsubo cardiomyopathy with reduced EF -04/2024 echocardiogram with apical ballooning and hyperdynamic LV.  Mild LVOT, peak  gradient 38.  EF 25 to 30%, normal RV.  Mild to moderate MR.  Aortic sclerosis present.  Aortic dilatation 39 mm.  Presenting with isolated episode of chest pain with stress-induced trigger.  Troponins up to 515.  Echocardiogram with obstructive CAD versus Takotsubo cardiomyopathy.  Underwent cardiac catheterization today that demonstrates no evidence of CAD.    Continues to do well, discharge delayed yesterday secondary to weather.  Okay to discharge from cardiology's perspective.  Blood pressure a little bit soft but should be able to tolerate current therapy when she is back in her normal environment.  Remains euvolemic.  We had cut back some of her GDMT yesterday to optimize addition of Entresto . GDMT: Continue with Toprol -XL 25 mg, Jardiance  10 mg Entresto  24-26 mg twice daily, spironolactone  12.5 mg. We have stopped aspirin .  Okay to continue atorvastatin  80 mg daily.  LDL 126. Repeat echocardiogram 2 to 3 months.  Also will need echocardiograms at least annually for moderate MR. Recommend repeat BMP 1 week. Log blood pressure daily and bring to upcoming appointment.  Will arrange follow-up.   For questions or updates, please contact  HeartCare Please consult [email or web address] for contact info under       Signed, Thom LITTIE Sluder, PA-C    "

## 2024-05-09 ENCOUNTER — Telehealth (HOSPITAL_COMMUNITY): Payer: Self-pay

## 2024-05-09 LAB — LIPOPROTEIN A (LPA): Lipoprotein (a): 45.7 nmol/L — ABNORMAL HIGH

## 2024-05-09 NOTE — Telephone Encounter (Signed)
 Referral recv'd and verified for MD signature. Follow up appointment is on 2/19. Insurance benefits and eligibility TBD.

## 2024-05-09 NOTE — Telephone Encounter (Signed)
 Called patient to see if she is interested in the Cardiac Rehab Program. Patient expressed interest. Explained scheduling process and went over insurance, patient verbalized understanding. Will contact patient for scheduling once f/u has been completed.

## 2024-05-09 NOTE — Telephone Encounter (Signed)
 Pt insurance is active and benefits verified through [facility] Co-pay $20, DED 0/0 met, out of pocket $4,000/$60 met, co-insurance 0%. no pre-authorization required. Passport, 05/09/2024@12 :13, REF# [id number]   TCR/ICR? ICR Visit(date of service)limitation? No limit Can multiple codes be used on the same date of service/visit?(IF ITS A LIMIT) yes    Is this a lifetime maximum or an annual maximum? annual Has the member used any of these services to date? no Is there a time limit (weeks/months) on start of program and/or program completion? no   Will contact patient to see if she is interested in the Cardiac Rehab Program. If interested, patient will need to complete follow up appt. Once completed, patient will be contacted for scheduling upon review by the RN Navigator.

## 2024-05-10 ENCOUNTER — Telehealth (HOSPITAL_COMMUNITY): Payer: Self-pay

## 2024-05-10 NOTE — Telephone Encounter (Signed)
 Called to confirm/remind patient of their appointment at the [facility] on 05/11/24 11:00.   Appointment:   [x] Confirmed  [] Left mess   [] No answer/No voice mail  [] VM Full/unable to leave message  [] Phone not in service  Patient reminded to bring all medications and/or complete list.  Confirmed patient has transportation. Gave directions, instructed to utilize valet parking.

## 2024-05-11 ENCOUNTER — Inpatient Hospital Stay (HOSPITAL_COMMUNITY): Admit: 2024-05-11 | Discharge: 2024-05-11 | Disposition: A | Attending: Cardiology

## 2024-05-11 ENCOUNTER — Ambulatory Visit (HOSPITAL_COMMUNITY): Payer: Self-pay | Admitting: Cardiology

## 2024-05-11 VITALS — BP 116/72 | HR 82 | Wt 177.4 lb

## 2024-05-11 DIAGNOSIS — I5020 Unspecified systolic (congestive) heart failure: Secondary | ICD-10-CM

## 2024-05-11 LAB — BASIC METABOLIC PANEL WITH GFR
Anion gap: 11 (ref 5–15)
BUN: 38 mg/dL — ABNORMAL HIGH (ref 8–23)
CO2: 22 mmol/L (ref 22–32)
Calcium: 10.4 mg/dL — ABNORMAL HIGH (ref 8.9–10.3)
Chloride: 107 mmol/L (ref 98–111)
Creatinine, Ser: 0.99 mg/dL (ref 0.44–1.00)
GFR, Estimated: 60 mL/min — ABNORMAL LOW
Glucose, Bld: 100 mg/dL — ABNORMAL HIGH (ref 70–99)
Potassium: 4.6 mmol/L (ref 3.5–5.1)
Sodium: 140 mmol/L (ref 135–145)

## 2024-05-11 NOTE — Progress Notes (Signed)
"   ReDS Vest / Clip - 05/11/24 1200       ReDS Vest / Clip   Station Marker B    Ruler Value 36    ReDS Value Range Low volume    ReDS Actual Value 25          "

## 2024-05-11 NOTE — Patient Instructions (Signed)
 There has been no changes to your medications.  Labs done today, your results will be available in MyChart, we will contact you for abnormal readings.   Keep follow up with General Cardiology as scheduled.   At the [facility], you and your health needs are our priority. As part of our continuing mission to provide you with exceptional heart care, we have created designated Provider Care Teams. These Care Teams include your primary Cardiologist (physician) and Advanced Practice Providers (APPs- Physician Assistants and Nurse Practitioners) who all work together to provide you with the care you need, when you need it.   You may see any of the following providers on your designated Care Team at your next follow up: Dr Toribio Fuel Dr Ezra Shuck Dr. Morene Brownie Greig Mosses, NP Caffie Shed, [place] [facility], [place] Beckey Coe, NP Jordan Lee, NP Ellouise Class, NP Tinnie Redman, PharmD Jaun Bash, PharmD   Please be sure to bring in all your medications bottles to every appointment.    Thank you for choosing [facility]

## 2024-05-11 NOTE — Progress Notes (Signed)
 "    HEART & VASCULAR TRANSITION OF CARE CONSULT NOTE     Referring Physician: Dr. Jillian   Chief Complaint: systolic heart failure   HPI: Referred to clinic by Dr. Jillian for heart failure consultation.   Kaitlyn Lowe is a 74 y.o. female  w/ PMH significant for hypothyroidism and osteoarthritis but no prior cardiac history, who recently presented to the ED after an episode of CP following a stressful situation and ruled in for NSTEMI. Echo showed apical ballooning and hyperdynamic basal left ventricular contractility, EF severely reduced 25-30%, mild-mod MR, RV normal.  LHC showed normal coronaries. Suspected to be stress induced CM, LVEDP 32. She was placed on 4 pillar GDMT. Discharged home on 1/27. Referred to [facility] clinic.   She presents today for f/u. Doing better but just feels tired and has mild dyspnea w/ exertion, NYHA Class II. No further CP. No LEE. ReDs normal at 25%. Compliant w/ meds. BP 116/72 today prior to taking AM meds. Denies orthostatic symptoms.    Cardiac Testing   Echo 1/26 1. There is apical ballooning and hyperdynamic basal left ventricular  contractility. There is mild LV outflow tract obstruction due to  compensatory hyperdynamic contractility of the basal LV wall segment. The  peak outflow gradient is 38 mm Hg. Left  ventricular ejection fraction, by estimation, is 25 to 30%. The left  ventricle has severely decreased function. The left ventricle demonstrates  regional wall motion abnormalities (see scoring diagram/findings for  description). Indeterminate diastolic  filling due to E-A fusion.   2. Right ventricular systolic function is normal. The right ventricular  size is normal. There is normal pulmonary artery systolic pressure. The  estimated right ventricular systolic pressure is 24.0 mmHg.   3. The mitral valve is abnormal. Mild to moderate mitral valve  regurgitation.   4. The aortic valve is tricuspid. There is mild calcification of the   aortic valve. There is mild thickening of the aortic valve. Aortic valve  regurgitation is trivial. Aortic valve sclerosis/calcification is present,  without any evidence of aortic  stenosis.   5. Aortic dilatation noted. There is mild dilatation of the aortic root,  measuring 39 mm.   6. The inferior vena cava is normal in size with greater than 50%  respiratory variability, suggesting right atrial pressure of 3 mmHg.   Comparison(s): Findings are strongly suggestive with takotsubo syndrome,  but cannot exclude multivessel CAD or stenosis in a wrap-around LAD  artery.    LHC 1/26  Coronary angiography 05/07/2024: LM: Normal LAD: No significant disease Lcx: No significant disease RCA: Dominant, No significant disease   LVEDP 32 mmHg   Conclusion: Normal coronary arteries with no coronary artery disease Stress induced cardiomyopathy  Past Medical History:  Diagnosis Date   Depression     Current Outpatient Medications  Medication Sig Dispense Refill   alendronate (FOSAMAX) 70 MG tablet Take 70 mg by mouth once a week. On Wednesday or Thursday     amitriptyline (ELAVIL) 10 MG tablet Take 10 mg by mouth at bedtime.     Ascorbic Acid (VITAMIN C) 500 MG CHEW Chew 1 tablet by mouth daily.     atorvastatin  (LIPITOR) 80 MG tablet Take 1 tablet (80 mg total) by mouth daily. 30 tablet 0   azelastine (ASTELIN) 0.1 % nasal spray Place 1 spray into both nostrils daily as needed for rhinitis or allergies. Use in each nostril as directed     B Complex CAPS Take 1 capsule by  mouth daily.     cariprazine  (VRAYLAR ) 1.5 MG capsule Take 1 capsule (1.5 mg total) by mouth daily. 90 capsule 0   cholecalciferol (VITAMIN D3) 25 MCG (1000 UNIT) tablet Take 1,000 Units by mouth daily.     diazepam  (VALIUM ) 10 MG tablet Take 2 tablets (20 mg total) by mouth at bedtime. 60 tablet 2   diclofenac Sodium (VOLTAREN ARTHRITIS PAIN) 1 % GEL Apply 1 Application topically as needed.     diphenhydrAMINE  (BENADRYL) 25 MG tablet Take 25 mg by mouth every 6 (six) hours as needed for itching.     docusate sodium (COLACE) 100 MG capsule Take 200 mg by mouth in the morning.     empagliflozin  (JARDIANCE ) 10 MG TABS tablet Take 1 tablet (10 mg total) by mouth daily. 30 tablet 0   HYDROcodone -acetaminophen  (NORCO/VICODIN) 5-325 MG tablet Take 1 tablet by mouth every 6 (six) hours as needed for moderate pain (pain score 4-6).     levothyroxine  (SYNTHROID ) 50 MCG tablet TAKE 1 TABLET EVERY MORNING ON AN EMPTY STOMACH 90 tablet 0   metoprolol  succinate (TOPROL -XL) 25 MG 24 hr tablet Take 1 tablet (25 mg total) by mouth daily. 30 tablet 0   Polyethyl Glycol-Propyl Glycol (SYSTANE OP) Place 1 drop into both eyes in the morning.     sacubitril -valsartan  (ENTRESTO ) 24-26 MG Take 1 tablet by mouth 2 (two) times daily. 60 tablet 0   spironolactone  (ALDACTONE ) 25 MG tablet Take 0.5 tablets (12.5 mg total) by mouth daily. 30 tablet 0   UNABLE TO FIND Take 1 Application by mouth as needed. Med Name: neuropathy cream     oxyCODONE  (ROXICODONE ) 5 MG immediate release tablet Take 1 tablet (5 mg total) by mouth every 6 (six) hours as needed. 20 tablet 0   No current facility-administered medications for this encounter.    Allergies[1]    Social History   Socioeconomic History   Marital status: Divorced    Spouse name: Not on file   Number of children: Not on file   Years of education: Not on file   Highest education level: Not on file  Occupational History   Not on file  Tobacco Use   Smoking status: Never   Smokeless tobacco: Never  Vaping Use   Vaping status: Never Used  Substance and Sexual Activity   Alcohol use: Yes    Alcohol/week: 5.0 standard drinks of alcohol    Types: 2 Glasses of wine, 3 Shots of liquor per week    Comment: three times a week 2 drinks at a time.   Drug use: No   Sexual activity: Not on file  Other Topics Concern   Not on file  Social History Narrative   Pt lives alone     Retired    Social Drivers of Health   Tobacco Use: Low Risk (05/04/2024)   Patient History    Smoking Tobacco Use: Never    Smokeless Tobacco Use: Never    Passive Exposure: Not on file  Financial Resource Strain: Not on file  Food Insecurity: No Food Insecurity (05/04/2024)   Epic    Worried About Programme Researcher, Broadcasting/film/video in the Last Year: Never true    Ran Out of Food in the Last Year: Never true  Transportation Needs: No Transportation Needs (05/04/2024)   Epic    Lack of Transportation (Medical): No    Lack of Transportation (Non-Medical): No  Physical Activity: Not on file  Stress: Not on file  Social  Connections: Moderately Integrated (05/04/2024)   Social Connection and Isolation Panel    Frequency of Communication with Friends and Family: Three times a week    Frequency of Social Gatherings with Friends and Family: Once a week    Attends Religious Services: More than 4 times per year    Active Member of [employer] or Organizations: No    Attends Banker Meetings: 1 to 4 times per year    Marital Status: Divorced  Catering Manager Violence: Not At Risk (05/04/2024)   Epic    Fear of Current or Ex-Partner: No    Emotionally Abused: No    Physically Abused: No    Sexually Abused: No  Depression (PHQ2-9): Not on file  Alcohol Screen: Not on file  Housing: Low Risk (05/04/2024)   Epic    Unable to Pay for Housing in the Last Year: No    Number of Times Moved in the Last Year: 0    Homeless in the Last Year: No  Utilities: Not At Risk (05/04/2024)   Epic    Threatened with loss of utilities: No  Health Literacy: Not on file      Family History  Problem Relation Age of Onset   Arthritis Mother    Mental illness Mother    Hypertension Mother    Transient ischemic attack Mother    Alzheimer's disease Neg Hx    Parkinson's disease Neg Hx    Tremor Neg Hx     Vitals:   05/11/24 1151  BP: 116/72  Pulse: 82  SpO2: 97%  Weight: 80.5 kg (177 lb 6.4 oz)     PHYSICAL EXAM: ReDs 25%, normal  General:  Well appearing. No respiratory difficulty HEENT: normal Neck: supple. no JVD. Carotids 2+ bilat; no bruits. No lymphadenopathy or thryomegaly appreciated. Cor: PMI nondisplaced. Regular rate & rhythm. No rubs, gallops or murmurs. Lungs: clear Abdomen: soft, nontender, nondistended. No hepatosplenomegaly. No bruits or masses. Good bowel sounds. Extremities: no cyanosis, clubbing, rash, edema Neuro: alert & oriented x 3, cranial nerves grossly intact. moves all 4 extremities w/o difficulty. Affect pleasant.  ECG: not performed    ASSESSMENT & PLAN:  1. 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 Does not need loop diuretic - she is on 4 pillar GDMT. Will not dose increase today given baseline BP prior to AM meds - continue Jardiance  10 mg daily  - continue Entresto  24-26 mg bid - continue Spironolactone  12.5 mg daily  - continue Toprol  XL 25 mg daily  - check BMP today  - suspect EF will improve. Cardiology to plan repeat echo in several wks. If does not improve, would recommend cMRI    Referred to HFSW (PCP, Medications, Transportation, ETOH Abuse, Drug Abuse, Insurance, Surveyor, Quantity) No  Refer to Pharmacy: No  Refer to Home Health: No  Refer to [facility]: No  Refer to General Cardiology: No  Follow up  w/ gen cards in 3 wks as planned.      [1]  Allergies Allergen Reactions   Codeine Nausea Only   "

## 2024-05-16 ENCOUNTER — Other Ambulatory Visit: Payer: Self-pay | Admitting: Psychiatry

## 2024-05-22 ENCOUNTER — Encounter: Admitting: Orthopedic Surgery

## 2024-05-31 ENCOUNTER — Ambulatory Visit: Admitting: Cardiology

## 2024-07-17 ENCOUNTER — Ambulatory Visit: Admitting: Psychiatry
